# Patient Record
Sex: Female | Born: 1935 | Race: White | Hispanic: No | Smoking: Former smoker
Health system: Southern US, Community
[De-identification: ages and names within clinical notes are randomized; demographics above are authoritative.]

## PROBLEM LIST (undated history)

## (undated) DIAGNOSIS — J40 Bronchitis, not specified as acute or chronic: Secondary | ICD-10-CM

## (undated) DIAGNOSIS — I10 Essential (primary) hypertension: Secondary | ICD-10-CM

## (undated) DIAGNOSIS — E785 Hyperlipidemia, unspecified: Secondary | ICD-10-CM

## (undated) DIAGNOSIS — K759 Inflammatory liver disease, unspecified: Secondary | ICD-10-CM

## (undated) DIAGNOSIS — T7840XA Allergy, unspecified, initial encounter: Secondary | ICD-10-CM

## (undated) DIAGNOSIS — C439 Malignant melanoma of skin, unspecified: Secondary | ICD-10-CM

## (undated) DIAGNOSIS — I639 Cerebral infarction, unspecified: Secondary | ICD-10-CM

## (undated) DIAGNOSIS — S42213A Unspecified displaced fracture of surgical neck of unspecified humerus, initial encounter for closed fracture: Secondary | ICD-10-CM

## (undated) HISTORY — DX: Cerebral infarction, unspecified: I63.9

## (undated) HISTORY — DX: Inflammatory liver disease, unspecified: K75.9

## (undated) HISTORY — DX: Essential (primary) hypertension: I10

## (undated) HISTORY — DX: Allergy, unspecified, initial encounter: T78.40XA

## (undated) HISTORY — PX: OTHER SURGICAL HISTORY: SHX169

## (undated) HISTORY — DX: Hyperlipidemia, unspecified: E78.5

## (undated) HISTORY — PX: TONSILLECTOMY: SUR1361

## (undated) HISTORY — DX: Bronchitis, not specified as acute or chronic: J40

---

## 1998-08-23 ENCOUNTER — Encounter: Admission: RE | Admit: 1998-08-23 | Discharge: 1998-08-23 | Payer: Self-pay | Admitting: Family Medicine

## 2002-02-22 ENCOUNTER — Encounter: Payer: Self-pay | Admitting: Dermatology

## 2002-02-22 ENCOUNTER — Encounter: Admission: RE | Admit: 2002-02-22 | Discharge: 2002-02-22 | Payer: Self-pay | Admitting: Dermatology

## 2002-03-01 ENCOUNTER — Ambulatory Visit (HOSPITAL_BASED_OUTPATIENT_CLINIC_OR_DEPARTMENT_OTHER): Admission: RE | Admit: 2002-03-01 | Discharge: 2002-03-01 | Payer: Self-pay | Admitting: General Surgery

## 2011-01-08 ENCOUNTER — Other Ambulatory Visit: Payer: Self-pay | Admitting: Dermatology

## 2011-01-08 ENCOUNTER — Ambulatory Visit
Admission: RE | Admit: 2011-01-08 | Discharge: 2011-01-08 | Disposition: A | Discharge status: Home / Self Care | Payer: Medicare Other | Source: Ambulatory Visit | Attending: Dermatology | Admitting: Dermatology

## 2011-01-08 DIAGNOSIS — R05 Cough: Secondary | ICD-10-CM

## 2016-05-14 ENCOUNTER — Emergency Department (HOSPITAL_COMMUNITY)
Admission: EM | Admit: 2016-05-14 | Discharge: 2016-05-15 | Disposition: A | Discharge status: Home / Self Care | Payer: Medicare Other | Attending: Emergency Medicine | Admitting: Emergency Medicine

## 2016-05-14 ENCOUNTER — Emergency Department (HOSPITAL_COMMUNITY): Payer: Medicare Other

## 2016-05-14 ENCOUNTER — Encounter (HOSPITAL_COMMUNITY): Payer: Self-pay | Admitting: *Deleted

## 2016-05-14 DIAGNOSIS — Y999 Unspecified external cause status: Secondary | ICD-10-CM | POA: Diagnosis not present

## 2016-05-14 DIAGNOSIS — Z7982 Long term (current) use of aspirin: Secondary | ICD-10-CM | POA: Insufficient documentation

## 2016-05-14 DIAGNOSIS — W010XXA Fall on same level from slipping, tripping and stumbling without subsequent striking against object, initial encounter: Secondary | ICD-10-CM | POA: Diagnosis not present

## 2016-05-14 DIAGNOSIS — Y92000 Kitchen of unspecified non-institutional (private) residence as  the place of occurrence of the external cause: Secondary | ICD-10-CM | POA: Diagnosis not present

## 2016-05-14 DIAGNOSIS — S42292A Other displaced fracture of upper end of left humerus, initial encounter for closed fracture: Secondary | ICD-10-CM

## 2016-05-14 DIAGNOSIS — Y9389 Activity, other specified: Secondary | ICD-10-CM | POA: Diagnosis not present

## 2016-05-14 DIAGNOSIS — S4992XA Unspecified injury of left shoulder and upper arm, initial encounter: Secondary | ICD-10-CM | POA: Diagnosis present

## 2016-05-14 MED ORDER — HYDROCODONE-ACETAMINOPHEN 5-325 MG PO TABS: 1.0000 | ORAL_TABLET | ORAL | 0 refills | Status: DC | PRN

## 2016-05-14 MED ORDER — FENTANYL CITRATE (PF) 100 MCG/2ML IJ SOLN
50.0000 ug | INTRAMUSCULAR | Status: AC | PRN
  Administered 2016-05-14 (×2): 50 ug via INTRAVENOUS
  Filled 2016-05-14 (×2): qty 2

## 2016-05-14 MED ORDER — HYDROCODONE-ACETAMINOPHEN 5-325 MG PO TABS
1.0000 | ORAL_TABLET | Freq: Once | ORAL | Status: AC
  Administered 2016-05-15: 1 via ORAL
  Filled 2016-05-14: qty 1

## 2016-05-14 NOTE — ED Provider Notes (Signed)
[facility] DEPT Provider Note   CSN: [id number] Arrival date & time: 05/14/16  1802  By signing my name below, I, Higinio Plan, attest that this documentation has been prepared under the direction and in the presence of non-physician practitioner, Charlann Lange, PA-C. Electronically Signed: Higinio Plan, Scribe. 05/14/2016. 10:39 PM.  History   Chief Complaint Chief Complaint  Patient presents with  . Fall  . Arm Injury   The history is provided by the patient. No language interpreter was used.   HPI Comments: Melanie Hendricks is a 80 y.o. female who presents to the Emergency Department complaining of left shoulder and arm pain s/p a fall that occurred a few minutes PTA.  Pt reports she was standing in her kitchen when she tripped on something with her right foot causing her to fall onto her left side. She denies hitting her head or loss of consciousness. She notes she was able to walk to the ambulance after the incident; she states she typically does not have balance issues when walking. Pt denies bleeding anywhere, abdominal pain, pain in her hips or BLE, neck pain, chest pain or pain when taking a deep breath. She denies use of blood thinners. Pt states she lives with her son and daughter in law.   History reviewed. No pertinent past medical history.  There are no active problems to display for this patient.  History reviewed. No pertinent surgical history.  OB History    No data available       Home Medications    Prior to Admission medications   Medication Sig Start Date End Date Taking? Authorizing Provider  aspirin EC 325 MG tablet Take 325 mg by mouth daily as needed for moderate pain.   Yes Historical Provider, MD    Family History No family history on file.  Social History Social History  Substance Use Topics  . Smoking status: Never Smoker  . Smokeless tobacco: Never Used  . Alcohol use No     Allergies   Review of patient's allergies indicates no known  allergies.   Review of Systems Review of Systems  Constitutional: Negative for fever.  HENT: Negative for facial swelling.   Respiratory: Negative for shortness of breath.   Cardiovascular: Negative for chest pain.  Gastrointestinal: Negative for abdominal pain.  Genitourinary: Negative for dysuria.  Musculoskeletal: Positive for arthralgias (left arm and shoulder). Negative for neck pain.  Skin: Negative for wound.  Neurological: Negative for dizziness, syncope, weakness and headaches.   Physical Exam Updated Vital Signs BP 161/67   Pulse 91   Temp 97.9 F (36.6 C) (Oral)   Resp 17   Ht 5\' 6"  (1.676 m)   Wt 200 lb (90.7 kg)   SpO2 98%   BMI 32.28 kg/m   Physical Exam  Constitutional: She is oriented to person, place, and time. She appears well-developed and well-nourished.  HENT:  Head: Normocephalic and atraumatic.  Eyes: Conjunctivae are normal. Pupils are equal, round, and reactive to light. Right eye exhibits no discharge. Left eye exhibits no discharge. No scleral icterus.  Neck: Normal range of motion. No JVD present. No tracheal deviation present.  Cardiovascular: Intact distal pulses.   Pulmonary/Chest: Effort normal. No stridor. She exhibits no tenderness.  Abdominal: There is no tenderness (no tenderness of LUQ).  Musculoskeletal:  No midline cervical tenderness, there is no clavicular tenderness or deformity of the [facility] joint  She is tender mostly in the proximal LUE without gross bone deformity  No elbow  tenderness, she has a 5/5 grip strength of the left hand  Neurological: She is alert and oriented to person, place, and time. Coordination normal.  Psychiatric: She has a normal mood and affect. Her behavior is normal. Judgment and thought content normal.  Nursing note and vitals reviewed.  ED Treatments / Results  Labs (all labs ordered are listed, but only abnormal results are displayed) Labs Reviewed - No data to display  EKG  EKG  Interpretation None       Radiology Dg Humerus Left  Result Date: 05/14/2016 CLINICAL DATA:  Fall at home today. Left upper arm pain. Initial encounter. EXAM: LEFT HUMERUS - 2+ VIEW COMPARISON:  None. FINDINGS: A comminuted mildly displaced fracture of the left humeral neck is seen, with extension through the greater tuberosity of the humeral head. IMPRESSION: Mildly displaced comminuted fracture through the left humeral neck and greater tuberosity of the humeral head. Electronically Signed   By: Earle Gell M.D.   On: 05/14/2016 19:48   Procedures Procedures  DIAGNOSTIC STUDIES:  Oxygen Saturation is 98% on RA, normal by my interpretation.    COORDINATION OF CARE:  10:34 PM Discussed treatment plan with pt at bedside and pt agreed to plan.  Medications Ordered in ED Medications  fentaNYL (SUBLIMAZE) injection 50 mcg (50 mcg Intravenous Given 05/14/16 2103)     Initial Impression / Assessment and Plan / ED Course  I have reviewed the triage vital signs and the nursing notes.  Pertinent labs & imaging results that were available during my care of the patient were reviewed by me and considered in my medical decision making (see chart for details).  Clinical Course   1. Left humeral head fracture.  Patient presents with left humeral fracture after mechanical fall. No head injury, other extremity injury, abdominal or chest pain. No coagulopathy. Closed fracture. Immobilizer applied. Will refer to orthopedics.   I personally performed the services described in this documentation, which was scribed in my presence. The recorded information has been reviewed and is accurate.   Final Clinical Impressions(s) / ED Diagnoses   Final diagnoses:  None    New Prescriptions New Prescriptions   No medications on file     Charlann Lange, Hershal Coria 05/14/16 [place], MD 05/15/16 [id number]

## 2016-05-14 NOTE — ED Notes (Signed)
Pt states she got her foot caught on a chair in the kitchen and fell injuring her left arm/shoulder.  Denies feeling faint or dizzy prior to fall.  States she just "tripped and fell in the kitchen"

## 2016-05-14 NOTE — ED Triage Notes (Signed)
Per EMS, pt from home, reports fall, she was in her kitchen, tripped on a chair-had to crawl to the phone to call for help.  She was able to call her son.  Pt is A&Ox 4.  Denies LOC, blood thinners.

## 2016-05-14 NOTE — ED Notes (Signed)
Pt requesting pain medication.  

## 2016-05-15 MED ORDER — ONDANSETRON 4 MG PO TBDP: 4.0000 mg | ORAL_TABLET | Freq: Three times a day (TID) | ORAL | 0 refills | Status: DC | PRN

## 2016-05-15 MED ORDER — ONDANSETRON 4 MG PO TBDP
4.0000 mg | ORAL_TABLET | Freq: Once | ORAL | Status: AC
  Administered 2016-05-15: 4 mg via ORAL
  Filled 2016-05-15: qty 1

## 2016-06-23 ENCOUNTER — Observation Stay (HOSPITAL_COMMUNITY): Payer: Medicare Other

## 2016-06-23 ENCOUNTER — Encounter (HOSPITAL_COMMUNITY): Payer: Self-pay

## 2016-06-23 ENCOUNTER — Inpatient Hospital Stay (HOSPITAL_COMMUNITY)
Admission: EM | Admit: 2016-06-23 | Discharge: 2016-06-26 | DRG: 065 | Disposition: A | Discharge status: Home / Self Care | Payer: Medicare Other | Attending: Family Medicine | Admitting: Family Medicine

## 2016-06-23 ENCOUNTER — Emergency Department (HOSPITAL_COMMUNITY): Payer: Medicare Other

## 2016-06-23 DIAGNOSIS — R29810 Facial weakness: Secondary | ICD-10-CM | POA: Diagnosis present

## 2016-06-23 DIAGNOSIS — R4701 Aphasia: Secondary | ICD-10-CM | POA: Diagnosis present

## 2016-06-23 DIAGNOSIS — R030 Elevated blood-pressure reading, without diagnosis of hypertension: Secondary | ICD-10-CM | POA: Diagnosis not present

## 2016-06-23 DIAGNOSIS — Z6838 Body mass index (BMI) 38.0-38.9, adult: Secondary | ICD-10-CM

## 2016-06-23 DIAGNOSIS — Z85820 Personal history of malignant melanoma of skin: Secondary | ICD-10-CM

## 2016-06-23 DIAGNOSIS — I63519 Cerebral infarction due to unspecified occlusion or stenosis of unspecified middle cerebral artery: Secondary | ICD-10-CM | POA: Diagnosis not present

## 2016-06-23 DIAGNOSIS — Z7982 Long term (current) use of aspirin: Secondary | ICD-10-CM

## 2016-06-23 DIAGNOSIS — R2990 Unspecified symptoms and signs involving the nervous system: Secondary | ICD-10-CM | POA: Diagnosis present

## 2016-06-23 DIAGNOSIS — Z8249 Family history of ischemic heart disease and other diseases of the circulatory system: Secondary | ICD-10-CM

## 2016-06-23 DIAGNOSIS — I6350 Cerebral infarction due to unspecified occlusion or stenosis of unspecified cerebral artery: Secondary | ICD-10-CM | POA: Insufficient documentation

## 2016-06-23 DIAGNOSIS — R000 Tachycardia, unspecified: Secondary | ICD-10-CM | POA: Diagnosis present

## 2016-06-23 DIAGNOSIS — E785 Hyperlipidemia, unspecified: Secondary | ICD-10-CM | POA: Diagnosis present

## 2016-06-23 DIAGNOSIS — I639 Cerebral infarction, unspecified: Principal | ICD-10-CM | POA: Diagnosis present

## 2016-06-23 DIAGNOSIS — K5900 Constipation, unspecified: Secondary | ICD-10-CM | POA: Diagnosis present

## 2016-06-23 DIAGNOSIS — H518 Other specified disorders of binocular movement: Secondary | ICD-10-CM | POA: Diagnosis present

## 2016-06-23 DIAGNOSIS — S42213D Unspecified displaced fracture of surgical neck of unspecified humerus, subsequent encounter for fracture with routine healing: Secondary | ICD-10-CM | POA: Diagnosis present

## 2016-06-23 DIAGNOSIS — H519 Unspecified disorder of binocular movement: Secondary | ICD-10-CM | POA: Diagnosis not present

## 2016-06-23 DIAGNOSIS — Z808 Family history of malignant neoplasm of other organs or systems: Secondary | ICD-10-CM

## 2016-06-23 DIAGNOSIS — G8191 Hemiplegia, unspecified affecting right dominant side: Secondary | ICD-10-CM | POA: Diagnosis present

## 2016-06-23 DIAGNOSIS — E669 Obesity, unspecified: Secondary | ICD-10-CM | POA: Diagnosis present

## 2016-06-23 DIAGNOSIS — Z800 Family history of malignant neoplasm of digestive organs: Secondary | ICD-10-CM

## 2016-06-23 DIAGNOSIS — S42212D Unspecified displaced fracture of surgical neck of left humerus, subsequent encounter for fracture with routine healing: Secondary | ICD-10-CM

## 2016-06-23 DIAGNOSIS — Z9889 Other specified postprocedural states: Secondary | ICD-10-CM

## 2016-06-23 HISTORY — DX: Malignant melanoma of skin, unspecified: C43.9

## 2016-06-23 HISTORY — DX: Unspecified displaced fracture of surgical neck of unspecified humerus, initial encounter for closed fracture: S42.213A

## 2016-06-23 LAB — COMPREHENSIVE METABOLIC PANEL
ALT: 26 U/L (ref 14–54)
AST: 22 U/L (ref 15–41)
Albumin: 4 g/dL (ref 3.5–5.0)
Alkaline Phosphatase: 73 U/L (ref 38–126)
Anion gap: 10 (ref 5–15)
BUN: 13 mg/dL (ref 6–20)
CO2: 21 mmol/L — ABNORMAL LOW (ref 22–32)
Calcium: 9.7 mg/dL (ref 8.9–10.3)
Chloride: 106 mmol/L (ref 101–111)
Creatinine, Ser: 0.78 mg/dL (ref 0.44–1.00)
GFR calc Af Amer: >60 mL/min (ref >60)
GFR calc non Af Amer: >60 mL/min (ref >60)
Glucose, Bld: 127 mg/dL — ABNORMAL HIGH (ref 65–99)
Potassium: 4.1 mmol/L (ref 3.5–5.1)
Sodium: 137 mmol/L (ref 135–145)
Total Bilirubin: 0.4 mg/dL (ref 0.3–1.2)
Total Protein: 7.1 g/dL (ref 6.5–8.1)

## 2016-06-23 LAB — CBC
HCT: 40.9 % (ref 36.0–46.0)
Hemoglobin: 13.7 g/dL (ref 12.0–15.0)
MCH: 30.4 pg (ref 26.0–34.0)
MCHC: 33.5 g/dL (ref 30.0–36.0)
MCV: 90.7 fL (ref 78.0–100.0)
Platelets: 271 10*3/uL (ref 150–400)
RBC: 4.51 MIL/uL (ref 3.87–5.11)
RDW: 12.4 % (ref 11.5–15.5)
WBC: 7.9 10*3/uL (ref 4.0–10.5)

## 2016-06-23 LAB — DIFFERENTIAL
Basophils Absolute: 0 10*3/uL (ref 0.0–0.1)
Basophils Relative: 0 %
Eosinophils Absolute: 0.1 10*3/uL (ref 0.0–0.7)
Eosinophils Relative: 1 %
Lymphocytes Relative: 17 %
Lymphs Abs: 1.4 10*3/uL (ref 0.7–4.0)
Monocytes Absolute: 0.7 10*3/uL (ref 0.1–1.0)
Monocytes Relative: 9 %
Neutro Abs: 5.8 10*3/uL (ref 1.7–7.7)
Neutrophils Relative %: 73 %

## 2016-06-23 LAB — I-STAT CHEM 8, ED
BUN: 16 mg/dL (ref 6–20)
Calcium, Ion: 1.12 mmol/L — ABNORMAL LOW (ref 1.15–1.40)
Chloride: 108 mmol/L (ref 101–111)
Creatinine, Ser: 0.7 mg/dL (ref 0.44–1.00)
Glucose, Bld: 124 mg/dL — ABNORMAL HIGH (ref 65–99)
HCT: 42 % (ref 36.0–46.0)
Hemoglobin: 14.3 g/dL (ref 12.0–15.0)
Potassium: 4.2 mmol/L (ref 3.5–5.1)
Sodium: 139 mmol/L (ref 135–145)
TCO2: 22 mmol/L (ref 0–100)

## 2016-06-23 LAB — APTT: aPTT: 30 seconds (ref 24–36)

## 2016-06-23 LAB — PROTIME-INR
INR: 0.97
Prothrombin Time: 12.9 seconds (ref 11.4–15.2)

## 2016-06-23 LAB — ETHANOL: Alcohol, Ethyl (B): <5 mg/dL (ref <5)

## 2016-06-23 LAB — I-STAT TROPONIN, ED: Troponin i, poc: 0.01 ng/mL (ref 0.00–0.08)

## 2016-06-23 IMAGING — CT CT HEAD CODE STROKE
3 of 4 series · 17 of 47 positions shown, 20 images · non-contrast
Comparison: None.

CLINICAL DATA: Code stroke. Left-sided weakness, slurred speech and
facial droop

EXAM:
CT HEAD WITHOUT CONTRAST
TECHNIQUE: Contiguous axial images were obtained from the base of the skull
through the vertex without intravenous contrast.

[Series 201: head w/o, idose (1) · axial · non-contrast · 0.39mm/px · z∈[+904,+1034]mm · 11 of 32 slices shown, 14 images]
[im 3/32  brain]
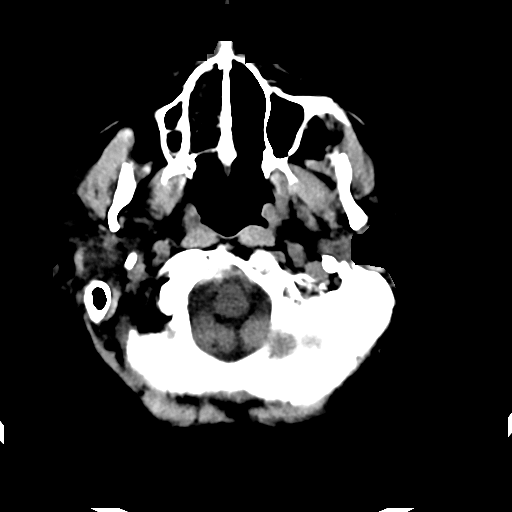
[im 3/32  bone]
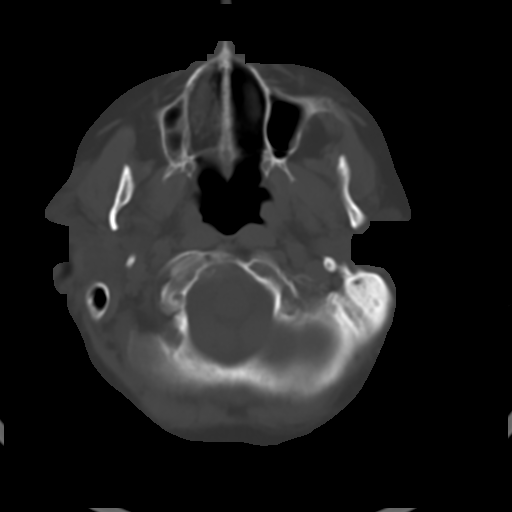
[im 5/32  brain]
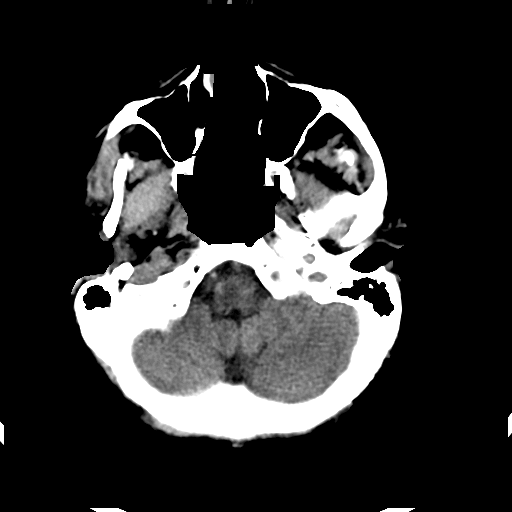
[im 7/32  brain]
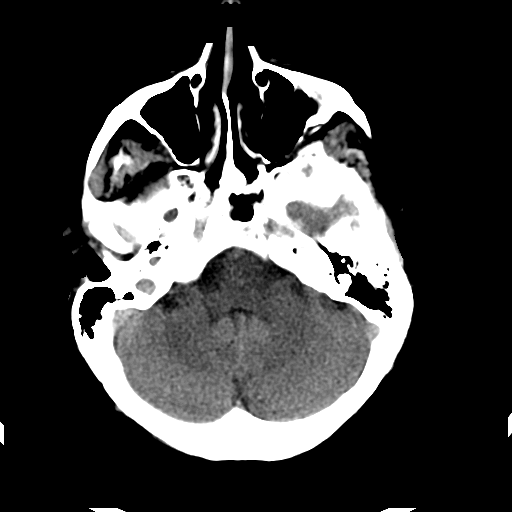
[im 12/32  brain]
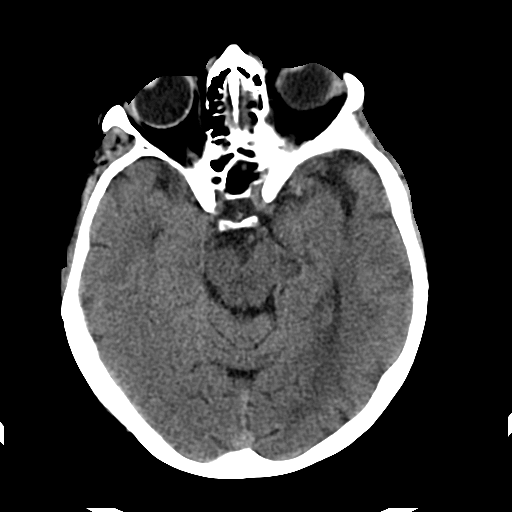
[im 14/32  brain]
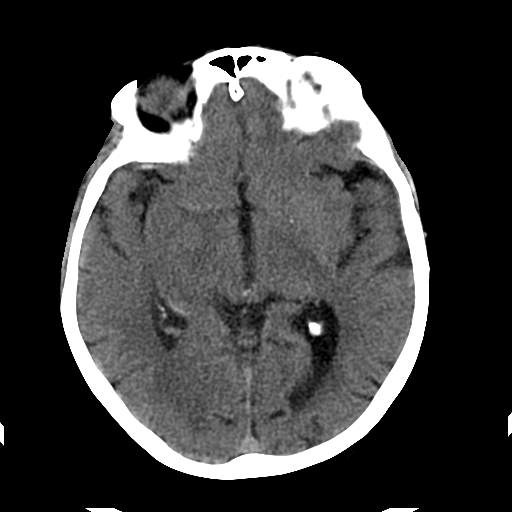
[im 14/32  bone]
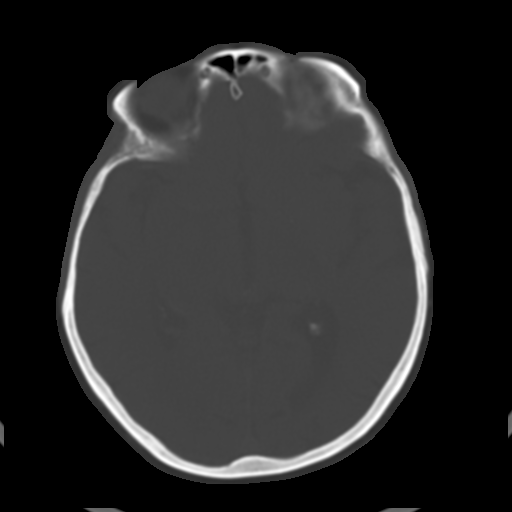
[im 16/32  brain]
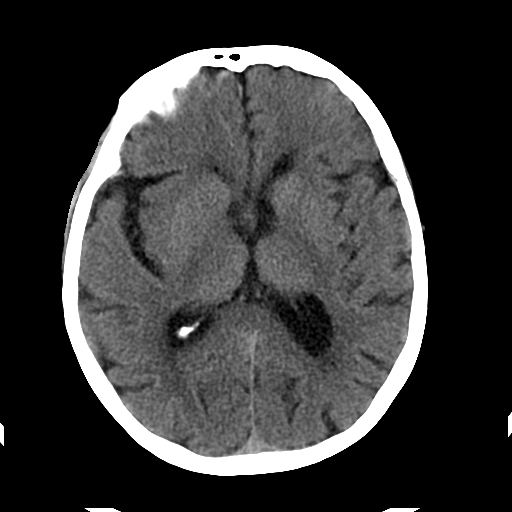
[im 18/32  brain]
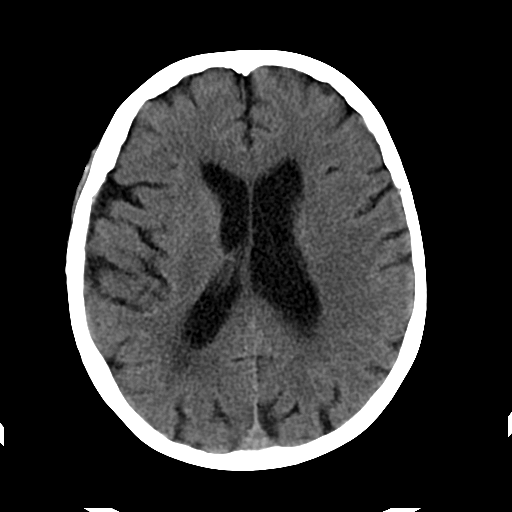
[im 20/32  brain]
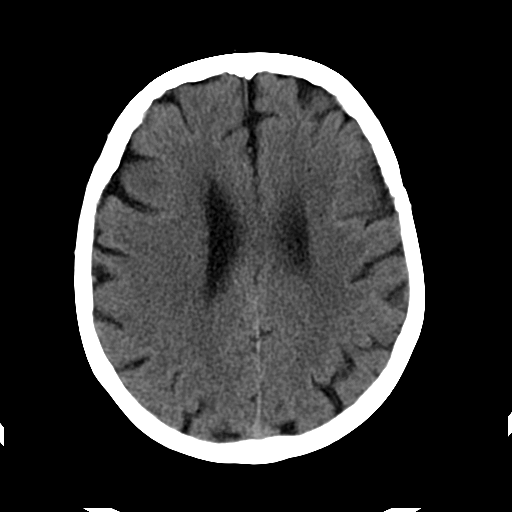
[im 25/32  brain]
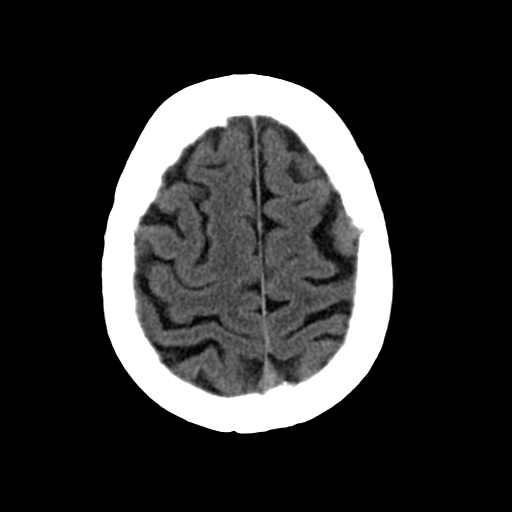
[im 25/32  bone]
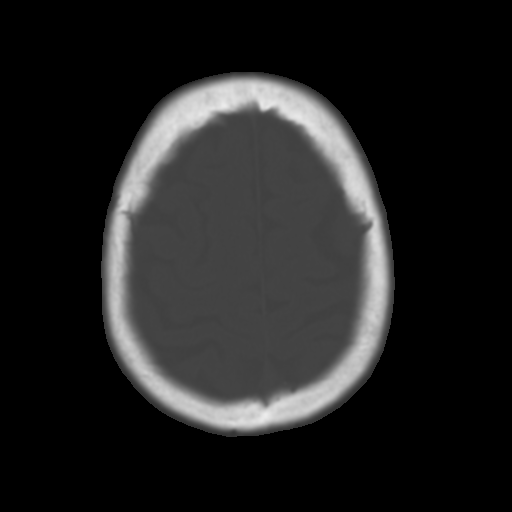
[im 27/32  brain]
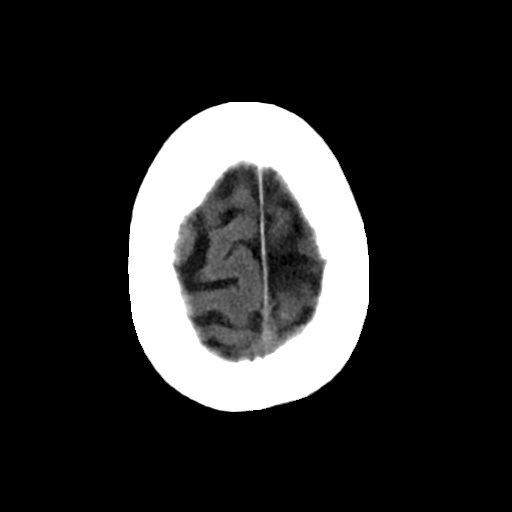
[im 29/32  brain]
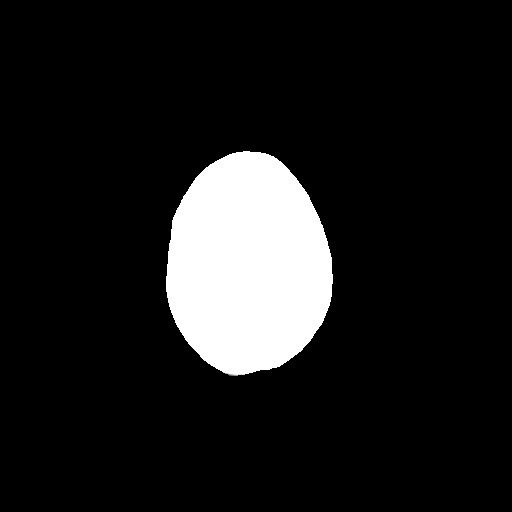

[Series 203: coronal st, idose (1) · coronal · 0.39mm/px · 3 of 66 slices shown]
[im 22/66  brain]
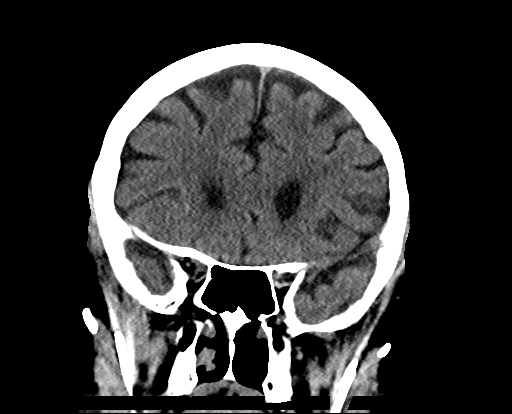
[im 29/66  brain]
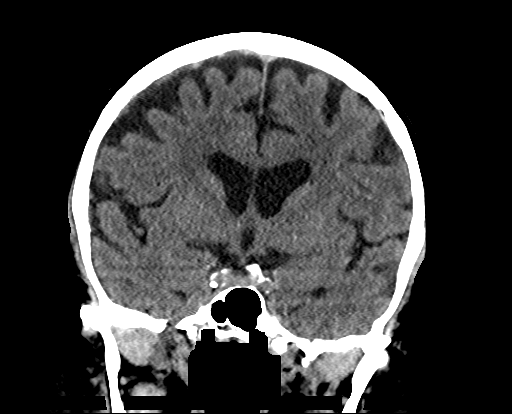
[im 37/66  brain]
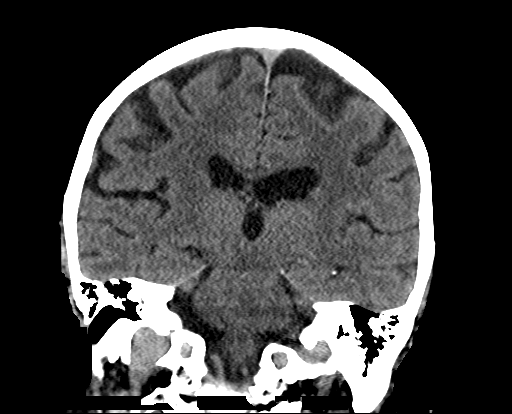

[Series 204: sagittal st, idose (1) · sagittal · 0.39mm/px · 3 of 66 slices shown]
[im 22/66  brain]
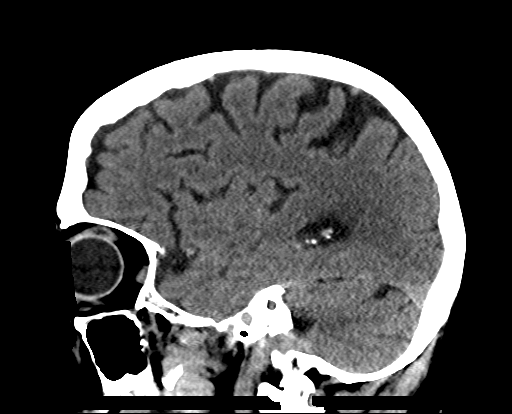
[im 33/66  brain]
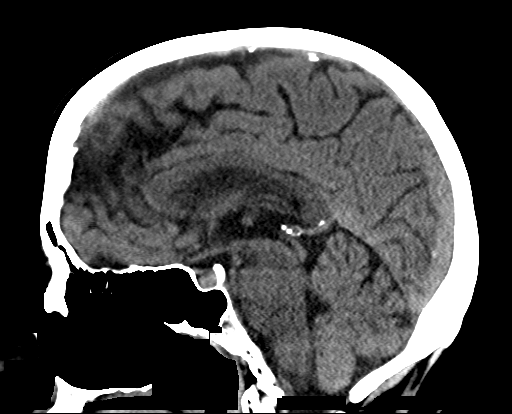
[im 44/66  brain]
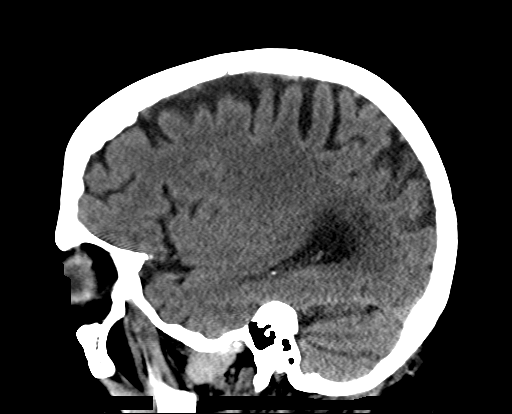

[17 of 47 positions shown; findings below may reference images not displayed]

FINDINGS: Brain: There is no acute hemorrhage. No evidence of acute cortical
infarct. No midline shift or mass effect. There is periventricular
hypoattenuation compatible with chronic microvascular disease. No
hydrocephalus.

Vascular: No hyperdense vessel sign.

Skull: Normal. Negative for fracture or focal lesion.

Sinuses/Orbits: No fluid levels or advanced mucosal thickening.
Normal orbits.

Other: Visualized extracranial soft tissues are normal.

ASPECTS ([place] Stroke Program Early CT Score)

- Ganglionic level infarction (caudate, lentiform nuclei, internal
capsule, insula, M1-M3 cortex): 7

- Supraganglionic infarction (M4-M6 cortex): 3

Total score (0-10 with 10 being normal): 10
IMPRESSION: 1. No acute intracranial abnormality.
2. ASPECTS is 10.

These results were called by telephone at the time of interpretation
on [DATE] at [DATE] to BANDA, PA, who verbally
acknowledged these results.

## 2016-06-23 MED ORDER — ASPIRIN 325 MG PO TABS: 325.0000 mg | ORAL_TABLET | Freq: Every day | ORAL | Status: DC

## 2016-06-23 MED ORDER — ENOXAPARIN SODIUM 40 MG/0.4ML ~~LOC~~ SOLN
40.0000 mg | SUBCUTANEOUS | Status: DC
  Administered 2016-06-24 – 2016-06-26 (×3): 40 mg via SUBCUTANEOUS
  Filled 2016-06-23 (×3): qty 0.4

## 2016-06-23 MED ORDER — SODIUM CHLORIDE 0.9 % IV SOLN
INTRAVENOUS | Status: DC
  Administered 2016-06-24: 75 mL via INTRAVENOUS
  Administered 2016-06-24: 04:00:00 via INTRAVENOUS

## 2016-06-23 MED ORDER — ASPIRIN 300 MG RE SUPP: 300.0000 mg | Freq: Every day | RECTAL | Status: DC

## 2016-06-23 MED ORDER — HYDROCODONE-ACETAMINOPHEN 5-325 MG PO TABS: 1.0000 | ORAL_TABLET | ORAL | Status: DC | PRN

## 2016-06-23 MED ORDER — STROKE: EARLY STAGES OF RECOVERY BOOK
Freq: Once | Status: DC
  Filled 2016-06-23 (×2): qty 1

## 2016-06-23 MED ORDER — ONDANSETRON HCL 4 MG/2ML IJ SOLN: 4.0000 mg | Freq: Three times a day (TID) | INTRAMUSCULAR | Status: DC | PRN

## 2016-06-23 MED ORDER — ACETAMINOPHEN 325 MG PO TABS
650.0000 mg | ORAL_TABLET | Freq: Four times a day (QID) | ORAL | Status: DC | PRN
  Administered 2016-06-26: 650 mg via ORAL
  Filled 2016-06-23: qty 2

## 2016-06-23 MED ORDER — HYDRALAZINE HCL 20 MG/ML IJ SOLN: 5.0000 mg | INTRAMUSCULAR | Status: DC | PRN

## 2016-06-23 MED ORDER — ZOLPIDEM TARTRATE 5 MG PO TABS: 5.0000 mg | ORAL_TABLET | Freq: Every evening | ORAL | Status: DC | PRN

## 2016-06-23 MED ORDER — SENNOSIDES-DOCUSATE SODIUM 8.6-50 MG PO TABS: 1.0000 | ORAL_TABLET | Freq: Every evening | ORAL | Status: DC | PRN

## 2016-06-23 NOTE — ED Notes (Signed)
Patient transported to MRI at this time via ED stretcher. Pt in no apparent distress at this time.

## 2016-06-23 NOTE — ED Notes (Signed)
Pt taken to CT by this RN.  ?

## 2016-06-23 NOTE — ED Notes (Signed)
Pt back in room from CT 

## 2016-06-23 NOTE — ED Notes (Signed)
Pt transported to MRI 

## 2016-06-23 NOTE — ED Triage Notes (Signed)
Per EMS, pt laid down for a nap around 1500 and woke up around 1600 with slurred speech, right side gaze, right side weakness. Hypertensive with EMS 201/93, Sinus tach, 121, RR 20, CBG 128. Pt was recently placed on vicodin for left arm fracture. Denies drug or alcohol use. Pt has left sided facial droop here and states "i feel weak on my right side" and patient has right side gaze.

## 2016-06-23 NOTE — ED Provider Notes (Signed)
[facility] DEPT Provider Note   CSN: [id number] Arrival date & time: 06/23/16  2035     History   Chief Complaint Chief Complaint  Patient presents with  . Aphasia  . Facial Droop    HPI Melanie Hendricks is a 80 y.o. female.  HPI   80 year old female presents today with strokelike symptoms. Son notes that coming home from work today at 5 units that his mother was having difficulty with speaking, slurred speech, with numbness and tingling. He denies any history of the same, reports that he last saw her normal this morning. Patient reports that she went to bed to take a nap around 3:30, and woke up around 4:30 noticing that she "felt fatigued". She reports she is having difficulty speaking, reports right sided weakness. She denies any specific headache complaints, reports numbness to the right upper and lower extremity.  Patient denies any significant past medical history other than allergic rhinitis. She reports that her blood pressure was elevated several months ago at the last orthopedic visit, not taking any medications. Patient denies any blood thinners. Denies any alcohol use.   Past Medical History:  Diagnosis Date  . Fx humeral neck   . Skin melanoma [facility])     Patient Active Problem List   Diagnosis Date Noted  . 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 Blood pressure elevated without history of HTN 06/23/2016  . Stroke ([place]) 06/23/2016  . Stroke-like symptoms   . Eye movement disorder   . Fx humeral neck     Past Surgical History:  Procedure Laterality Date  . TONSILLECTOMY      OB History    No data available       Home Medications    Prior to Admission medications   Medication Sig Start Date End Date Taking? Authorizing Provider  acetaminophen (TYLENOL) 325 MG tablet Take 325 mg by mouth every 6 (six) hours as needed for mild pain or headache.   Yes Historical Provider, MD  aspirin EC 325 MG tablet Take 325 mg by mouth daily as needed for moderate  pain.   Yes Historical Provider, MD  HYDROcodone-acetaminophen (NORCO/VICODIN) 5-325 MG tablet Take 1 tablet by mouth every 4 (four) hours as needed. 05/14/16  Yes Shari Upstill, PA-C  ondansetron (ZOFRAN ODT) 4 MG disintegrating tablet Take 1 tablet (4 mg total) by mouth every 8 (eight) hours as needed for nausea or vomiting. 05/15/16  Yes Charlann Lange, PA-C    Family History Family History  Problem Relation Age of Onset  . Stomach cancer Mother   . Thyroid cancer Father   . Heart attack Father   . Seizures Brother   . Heart failure Brother     Social History Social History  Substance Use Topics  . Smoking status: Never Smoker  . Smokeless tobacco: Never Used  . Alcohol use No     Allergies   Review of patient's allergies indicates no known allergies.   Review of Systems Review of Systems  All other systems reviewed and are negative.   Physical Exam Updated Vital Signs BP 174/72   Pulse 110   Temp 98 F (36.7 C) (Oral)   Resp (!) 29   Ht 5\' 6"  (1.676 m)   Wt 90.7 kg   SpO2 93%   BMI 32.28 kg/m   Physical Exam  Constitutional: She is oriented to person, place, and time. She appears well-developed and well-nourished.  HENT:  Head: Normocephalic and atraumatic.  Eyes: Conjunctivae are normal. Pupils  are equal, round, and reactive to light. Right eye exhibits no discharge. Left eye exhibits no discharge. No scleral icterus.  Neck: Normal range of motion. No JVD present. No tracheal deviation present.  Pulmonary/Chest: Effort normal. No stridor.  Neurological: She is alert and oriented to person, place, and time. Coordination normal.  Alert and oriented, follows commands, extraocular movements are intact with right-sided gaze overcome by patient, visual fields intact, minor facial palsy left-sided, no pronator drift, left leg motor intact with no drift, no ataxia, sensation intact throughout, mild aphasia, mild dysarthria  Psychiatric: She has a normal mood and affect.  Her behavior is normal. Judgment and thought content normal.  Nursing note and vitals reviewed. Alert and oriented, follows commands, extraocular movements are intact with right-sided gaze overcome by patient, visual fields intact, minor facial palsy left-sided, no pronator drift, left leg motor intact with no drift, no ataxia, sensation intact throughout, mild aphasia, mild dysarthria  NIH 4  ED Treatments / Results  Labs (all labs ordered are listed, but only abnormal results are displayed) Labs Reviewed  COMPREHENSIVE METABOLIC PANEL - Abnormal; Notable for the following:       Result Value   CO2 21 (*)    Glucose, Bld 127 (*)    All other components within normal limits  I-STAT CHEM 8, ED - Abnormal; Notable for the following:    Glucose, Bld 124 (*)    Calcium, Ion 1.12 (*)    All other components within normal limits  ETHANOL  PROTIME-INR  APTT  CBC  DIFFERENTIAL  RAPID URINE DRUG SCREEN, HOSP PERFORMED  URINALYSIS, ROUTINE W REFLEX MICROSCOPIC (NOT AT [facility])  HEMOGLOBIN A1C  LIPID PANEL  I-STAT TROPOININ, ED    EKG  EKG Interpretation None       Radiology Ct Head Code Stroke W/o Cm  Result Date: 06/23/2016 CLINICAL DATA:  Code stroke. Left-sided weakness, slurred speech and facial droop EXAM: CT HEAD WITHOUT CONTRAST TECHNIQUE: Contiguous axial images were obtained from the base of the skull through the vertex without intravenous contrast. COMPARISON:  None. FINDINGS: Brain: There is no acute hemorrhage. No evidence of acute cortical infarct. No midline shift or mass effect. There is periventricular hypoattenuation compatible with chronic microvascular disease. No hydrocephalus. Vascular: No hyperdense vessel sign. Skull: Normal. Negative for fracture or focal lesion. Sinuses/Orbits: No fluid levels or advanced mucosal thickening. Normal orbits. Other: Visualized extracranial soft tissues are normal. ASPECTS ([place] Stroke Program Early CT Score) - Ganglionic level  infarction (caudate, lentiform nuclei, internal capsule, insula, M1-M3 cortex): 7 - Supraganglionic infarction (M4-M6 cortex): 3 Total score (0-10 with 10 being normal): 10 IMPRESSION: 1. No acute intracranial abnormality. 2. ASPECTS is 10. These results were called by telephone at the time of interpretation on 06/23/2016 at 9:12 pm to [place], PA, who verbally acknowledged these results. Electronically Signed   By: Ulyses Jarred M.D.   On: 06/23/2016 21:12    Procedures Procedures (including critical care time)  Medications Ordered in ED Medications  acetaminophen (TYLENOL) tablet 650 mg (not administered)  HYDROcodone-acetaminophen (NORCO/VICODIN) 5-325 MG per tablet 1 tablet (not administered)  hydrALAZINE (APRESOLINE) injection 5 mg (not administered)  ondansetron (ZOFRAN) injection 4 mg (not administered)  0.9 %  sodium chloride infusion (not administered)   stroke: mapping our early stages of recovery book (not administered)  senna-docusate (Senokot-S) tablet 1 tablet (not administered)  enoxaparin (LOVENOX) injection 40 mg (not administered)  aspirin suppository 300 mg (not administered)    Or  aspirin  tablet 325 mg (not administered)  zolpidem (AMBIEN) tablet 5 mg (not administered)     Initial Impression / Assessment and Plan / ED Course  I have reviewed the triage vital signs and the nursing notes.  Pertinent labs & imaging results that were available during my care of the patient were reviewed by me and considered in my medical decision making (see chart for details).  Clinical Course     Final Clinical Impressions(s) / ED Diagnoses   Final diagnoses:  Stroke-like symptoms  Stroke (cerebrum) ([place])  Closed fracture of neck of left humerus with routine healing, subsequent encounter    Labs: Stroke order set  Imaging: CT code stroke  Consults:  Therapeutics:  Discharge Meds:   Assessment/Plan:  80 year old female presents today with strokelike  symptoms. Code stroke called, CT shows no acute findings, patient continues to have symptoms, no worsening since waking. Patient was evaluated by Dr. Nicole Kindred. The ED who recommended hospital admission and stroke workup. Hospitalist consult at and agreed to admission.       New Prescriptions New Prescriptions   No medications on file     Okey Regal, PA-C 06/23/16 2224    Duffy Bruce, MD 06/25/16 1459

## 2016-06-23 NOTE — Consult Note (Signed)
Admission H&P    Chief Complaint: Slurred speech, numbness and tingling and deviation.  HPI: Melanie Hendricks is an 80 y.o. female with no documented systemic medical disorder presenting with new onset slurred speech, tingling and numbness involving left arm and hand, and deviation of eyes to the right side. Patient has no previous history of stroke nor TIA. She has not been taking antiplatelet medication on a routine basis. CT scan of her head showed no acute intracranial abnormality. No clear facial droop was noted by family. Speech apparently has improved but still not quite back to baseline. Patient fractured her left humerus 5 weeks ago and has kept her left upper extremity in a sling. NIH stroke score was 2.  LSN: 3:00 PM on 06/23/2016 tPA Given: No: Beyond time under for treatment consideration mRankin:  History reviewed. No pertinent past medical history.  History reviewed. No pertinent surgical history.  History reviewed. No pertinent family history. Social History:  reports that she has never smoked. She has never used smokeless tobacco. She reports that she does not drink alcohol or use drugs.  Allergies: No Known Allergies  Medications: Preadmission medications were reviewed by me.  ROS: History obtained from the patient  General ROS: negative for - chills, fatigue, fever, night sweats, weight gain or weight loss Psychological ROS: negative for - behavioral disorder, hallucinations, memory difficulties, mood swings or suicidal ideation Ophthalmic ROS: negative for - blurry vision, double vision, eye pain or loss of vision ENT ROS: negative for - epistaxis, nasal discharge, oral lesions, sore throat, tinnitus or vertigo Allergy and Immunology ROS: negative for - hives or itchy/watery eyes Hematological and Lymphatic ROS: negative for - bleeding problems, bruising or swollen lymph nodes Endocrine ROS: negative for - galactorrhea, hair pattern changes, polydipsia/polyuria or  temperature intolerance Respiratory ROS: negative for - cough, hemoptysis, shortness of breath or wheezing Cardiovascular ROS: negative for - chest pain, dyspnea on exertion, edema or irregular heartbeat Gastrointestinal ROS: negative for - abdominal pain, diarrhea, hematemesis, nausea/vomiting or stool incontinence Genito-Urinary ROS: negative for - dysuria, hematuria, incontinence or urinary frequency/urgency Musculoskeletal ROS: negative for - joint swelling or muscular weakness Neurological ROS: as noted in HPI Dermatological ROS: negative for rash and skin lesion changes  Physical Examination: Blood pressure 174/74, pulse 111, temperature 98 F (36.7 C), temperature source Oral, resp. rate 25, height 5\' 6"  (1.676 m), weight 90.7 kg (200 lb), SpO2 97 %.  HEENT-  Normocephalic, no lesions, without obvious abnormality.  Normal external eye and conjunctiva.  Normal TM's bilaterally.  Normal auditory canals and external ears. Normal external nose, mucus membranes and septum.  Normal pharynx. Neck supple with no masses, nodes, nodules or enlargement. Cardiovascular - tachycardia, regular rhythm, normal S1 and S2, no murmur or gallop Lungs - chest clear, no wheezing, rales, normal symmetric air entry Abdomen - soft, non-tender; bowel sounds normal; no masses,  no organomegaly Extremities - no joint deformities, effusion, or inflammation  Neurologic Examination: Mental Status: Alert, oriented, thought content appropriate.  Speech fluent without evidence of aphasia. Able to follow commands without difficulty. Cranial Nerves: II-Visual fields were normal. III/IV/VI-Pupils were equal and reacted normally to light. Eyes were conjugately deviated to the right side and did not move leftward beyond the midline.    V/VII-no facial numbness and no facial weakness. VIII-normal. X-normal speech and symmetrical palatal movement. XI: trapezius strength/neck flexion strength normal  bilaterally XII-midline tongue extension with normal strength. Motor: 5/5 strength of right upper extremity and lower extremities. Strength of  left upper extremity was difficult to assess because of pain with movement. Sensory: Normal throughout. Deep Tendon Reflexes: 1+ and symmetric. Plantars: Flexor bilaterally Cerebellar: Normal finger-to-nose testing. Carotid auscultation: Normal  Results for orders placed or performed during the hospital encounter of 06/23/16 (from the past 48 hour(s))  Protime-INR     Status: None   Collection Time: 06/23/16  9:21 PM  Result Value Ref Range   Prothrombin Time 12.9 11.4 - 15.2 seconds   INR 0.97   APTT     Status: None   Collection Time: 06/23/16  9:21 PM  Result Value Ref Range   aPTT 30 24 - 36 seconds  CBC     Status: None   Collection Time: 06/23/16  9:21 PM  Result Value Ref Range   WBC 7.9 4.0 - 10.5 K/uL   RBC 4.51 3.87 - 5.11 MIL/uL   Hemoglobin 13.7 12.0 - 15.0 g/dL   HCT 40.9 36.0 - 46.0 %   MCV 90.7 78.0 - 100.0 fL   MCH 30.4 26.0 - 34.0 pg   MCHC 33.5 30.0 - 36.0 g/dL   RDW 12.4 11.5 - 15.5 %   Platelets 271 150 - 400 K/uL  Differential     Status: None   Collection Time: 06/23/16  9:21 PM  Result Value Ref Range   Neutrophils Relative % 73 %   Neutro Abs 5.8 1.7 - 7.7 K/uL   Lymphocytes Relative 17 %   Lymphs Abs 1.4 0.7 - 4.0 K/uL   Monocytes Relative 9 %   Monocytes Absolute 0.7 0.1 - 1.0 K/uL   Eosinophils Relative 1 %   Eosinophils Absolute 0.1 0.0 - 0.7 K/uL   Basophils Relative 0 %   Basophils Absolute 0.0 0.0 - 0.1 K/uL  I-stat troponin, ED (not at [facility], [facility])     Status: None   Collection Time: 06/23/16  9:32 PM  Result Value Ref Range   Troponin i, poc 0.01 0.00 - 0.08 ng/mL   Comment 3            Comment: Due to the release kinetics of cTnI, a negative result within the first hours of the onset of symptoms does not rule out myocardial infarction with certainty. If myocardial infarction is still  suspected, repeat the test at appropriate intervals.   I-Stat Chem 8, ED  (not at [facility], [facility])     Status: Abnormal   Collection Time: 06/23/16  9:34 PM  Result Value Ref Range   Sodium 139 135 - 145 mmol/L   Potassium 4.2 3.5 - 5.1 mmol/L   Chloride 108 101 - 111 mmol/L   BUN 16 6 - 20 mg/dL   Creatinine, Ser 0.70 0.44 - 1.00 mg/dL   Glucose, Bld 124 (H) 65 - 99 mg/dL   Calcium, Ion 1.12 (L) 1.15 - 1.40 mmol/L   TCO2 22 0 - 100 mmol/L   Hemoglobin 14.3 12.0 - 15.0 g/dL   HCT 42.0 36.0 - 46.0 %   Ct Head Code Stroke W/o Cm  Result Date: 06/23/2016 CLINICAL DATA:  Code stroke. Left-sided weakness, slurred speech and facial droop EXAM: CT HEAD WITHOUT CONTRAST TECHNIQUE: Contiguous axial images were obtained from the base of the skull through the vertex without intravenous contrast. COMPARISON:  None. FINDINGS: Brain: There is no acute hemorrhage. No evidence of acute cortical infarct. No midline shift or mass effect. There is periventricular hypoattenuation compatible with chronic microvascular disease. No hydrocephalus. Vascular: No hyperdense vessel sign. Skull: Normal. Negative for  fracture or focal lesion. Sinuses/Orbits: No fluid levels or advanced mucosal thickening. Normal orbits. Other: Visualized extracranial soft tissues are normal. ASPECTS ([place] Stroke Program Early CT Score) - Ganglionic level infarction (caudate, lentiform nuclei, internal capsule, insula, M1-M3 cortex): 7 - Supraganglionic infarction (M4-M6 cortex): 3 Total score (0-10 with 10 being normal): 10 IMPRESSION: 1. No acute intracranial abnormality. 2. ASPECTS is 10. These results were called by telephone at the time of interpretation on 06/23/2016 at 9:12 pm to Nellie, PA, who verbally acknowledged these results. Electronically Signed   By: Ulyses Jarred M.D.   On: 06/23/2016 21:12    Assessment: 80 y.o. female presenting with possible acute right cerebral infarction. Focal seizure activity is unlikely, but  cannot be completely ruled out.  Stroke Risk Factors - none  Plan: 1. HgbA1c, fasting lipid panel 2. MRI, MRA  of the brain without contrast 3. PT consult, OT consult 4. Echocardiogram 5. Carotid dopplers 6. Prophylactic therapy-Antiplatelet med: Aspirin  7. Risk factor modification 8. Telemetry monitoring 9. EEG, routine adult study  C.R. Nicole Kindred, MD [facility] [phone]  06/23/2016, 9:50 PM

## 2016-06-23 NOTE — H&P (Signed)
History and Physical    Melanie Hendricks [id number] DOB: 1936/02/26 DOA: 06/23/2016  Referring MD/NP/PA:   PCP: Pcp Not In System   Patient coming from:  The patient is coming from home.  At baseline, pt is independent for most of ADL.  Chief Complaint: Slurred speech, deviation of eyes to the right side, right facial droop, right-sided numbness  HPI: Melanie Hendricks is a 80 y.o. female with medical history significant of skin melanoma, who presents with slurred speech, deviation of eyes to the right side, right facial droop, right-sided numbness.  Per her son, pt laid down for a nap around 1500 and woke up around 1600 with slurred speech, right side gaze, right side numbness. She is also mildly disoriented. Patient states that she also has right foot heaviness. No hearing loss. She has nausea, and vomited once. No abdominal pain or diarrhea. Patient denies chest pain, shortness breath, fever, chills, symptoms of UTI. Per EMS, pt was hypertensive with Bp 201/93, sinus tach, 121, RR 20, CBG 128. Pt had left humeral neck fracture which did not require surgery. She still has mild pain and is currently taking Vicodin and Tylenol for pain.  ED Course: pt was found to have WBC 7.9, INR 0.7, negative troponin, pending a urinalysis, electrolytes and renal function okay, temperature normal, tachycardia with heart rate ~110, negative CT head for acute intracranial abnormalities. Patient is placed on telemetry bed for observation. Neurology, Dr. Nicole Kindred was consulted.  Review of Systems:   General: no fevers, chills, no changes in body weight, has fatigue HEENT: no blurry vision, hearing changes or sore throat Respiratory: no dyspnea, coughing, wheezing CV: no chest pain, no palpitations GI: no nausea, vomiting, abdominal pain, diarrhea, constipation GU: no dysuria, burning on urination, increased urinary frequency, hematuria  Ext: no leg edema Neuro: has right sided numbness and tingling,  right facial droop and slurred speech. Has eye deviation to the right side. No hearing loss. Skin: no rash, no skin tear. MSK: No muscle spasm, no deformity, no limitation of range of movement in spin Heme: No easy bruising.  Travel history: No recent long distant travel.  Allergy: No Known Allergies  Past Medical History:  Diagnosis Date  . Fx humeral neck   . Skin melanoma ([place])     Past Surgical History:  Procedure Laterality Date  . TONSILLECTOMY      Social History:  reports that she has never smoked. She has never used smokeless tobacco. She reports that she does not drink alcohol or use drugs.  Family History:  Family History  Problem Relation Age of Onset  . Stomach cancer Mother   . Thyroid cancer Father   . Heart attack Father   . Seizures Brother   . Heart failure Brother      Prior to Admission medications   Medication Sig Start Date End Date Taking? Authorizing Provider  acetaminophen (TYLENOL) 325 MG tablet Take 325 mg by mouth every 6 (six) hours as needed for mild pain or headache.   Yes Historical Provider, MD  aspirin EC 325 MG tablet Take 325 mg by mouth daily as needed for moderate pain.   Yes Historical Provider, MD  HYDROcodone-acetaminophen (NORCO/VICODIN) 5-325 MG tablet Take 1 tablet by mouth every 4 (four) hours as needed. 05/14/16  Yes Shari Upstill, PA-C  ondansetron (ZOFRAN ODT) 4 MG disintegrating tablet Take 1 tablet (4 mg total) by mouth every 8 (eight) hours as needed for nausea or vomiting. 05/15/16  Yes Charlann Lange,  PA-C    Physical Exam: Vitals:   06/23/16 2115 06/23/16 2130 06/23/16 2145 06/23/16 2200  BP: 182/69 186/70 181/66 174/72  Pulse: 117 110 110 110  Resp: 23 26 26  (!) 29  Temp:      TempSrc:      SpO2: 96% 96% 96% 93%  Weight:      Height:       General: Not in acute distress HEENT:       Eyes: PERRL, EOMI, no scleral icterus.       ENT: No discharge from the ears and nose, no pharynx injection, no tonsillar  enlargement.        Neck: No JVD, no bruit, no mass felt. Heme: No neck lymph node enlargement. Cardiac: S1/S2, RRR, No murmurs, No gallops or rubs. Respiratory: No rales, wheezing, rhonchi or rubs. GI: Soft, nondistended, nontender, no rebound pain, no organomegaly, BS present. GU: No hematuria Ext: No pitting leg edema bilaterally. 2+DP/PT pulse bilaterally. Musculoskeletal: No joint deformities, No joint redness or warmth, no limitation of ROM in spin. Skin: No rashes.  Neuro: Alert, mildly confused, but oriented X3, has eye deviation to the right side and the right facial droop. Muscle strength 5/5 in all extremities, sensation to light touch intact. Knee reflex 1+ bilaterally. Negative Babinski's sign.  Psych: Patient is not psychotic, no suicidal or hemocidal ideation.  Labs on Admission: I have personally reviewed following labs and imaging studies  CBC:  Recent Labs Lab 06/23/16 2121 06/23/16 2134  WBC 7.9  --   NEUTROABS 5.8  --   HGB 13.7 14.3  HCT 40.9 42.0  MCV 90.7  --   PLT 271  --    Basic Metabolic Panel:  Recent Labs Lab 06/23/16 2121 06/23/16 2134  NA 137 139  K 4.1 4.2  CL 106 108  CO2 21*  --   GLUCOSE 127* 124*  BUN 13 16  CREATININE 0.78 0.70  CALCIUM 9.7  --    GFR: Estimated Creatinine Clearance: 66.8 mL/min (by C-G formula based on SCr of 0.7 mg/dL). Liver Function Tests:  Recent Labs Lab 06/23/16 2121  AST 22  ALT 26  ALKPHOS 73  BILITOT 0.4  PROT 7.1  ALBUMIN 4.0   No results for input(s): LIPASE, AMYLASE in the last 168 hours. No results for input(s): AMMONIA in the last 168 hours. Coagulation Profile:  Recent Labs Lab 06/23/16 2121  INR 0.97   Cardiac Enzymes: No results for input(s): CKTOTAL, CKMB, CKMBINDEX, TROPONINI in the last 168 hours. BNP (last 3 results) No results for input(s): PROBNP in the last 8760 hours. HbA1C: No results for input(s): HGBA1C in the last 72 hours. CBG: No results for input(s): GLUCAP  in the last 168 hours. Lipid Profile: No results for input(s): CHOL, HDL, LDLCALC, TRIG, CHOLHDL, LDLDIRECT in the last 72 hours. Thyroid Function Tests: No results for input(s): TSH, T4TOTAL, FREET4, T3FREE, THYROIDAB in the last 72 hours. Anemia Panel: No results for input(s): VITAMINB12, FOLATE, FERRITIN, TIBC, IRON, RETICCTPCT in the last 72 hours. Urine analysis: No results found for: COLORURINE, APPEARANCEUR, LABSPEC, PHURINE, GLUCOSEU, HGBUR, BILIRUBINUR, KETONESUR, PROTEINUR, UROBILINOGEN, NITRITE, LEUKOCYTESUR Sepsis Labs: @LABRCNTIP (procalcitonin:4,lacticidven:4) )No results found for this or any previous visit (from the past 240 hour(s)).   Radiological Exams on Admission: Ct Head Code Stroke W/o Cm  Result Date: 06/23/2016 CLINICAL DATA:  Code stroke. Left-sided weakness, slurred speech and facial droop EXAM: CT HEAD WITHOUT CONTRAST TECHNIQUE: Contiguous axial images were obtained from the base of the  skull through the vertex without intravenous contrast. COMPARISON:  None. FINDINGS: Brain: There is no acute hemorrhage. No evidence of acute cortical infarct. No midline shift or mass effect. There is periventricular hypoattenuation compatible with chronic microvascular disease. No hydrocephalus. Vascular: No hyperdense vessel sign. Skull: Normal. Negative for fracture or focal lesion. Sinuses/Orbits: No fluid levels or advanced mucosal thickening. Normal orbits. Other: Visualized extracranial soft tissues are normal. ASPECTS ([place] Stroke Program Early CT Score) - Ganglionic level infarction (caudate, lentiform nuclei, internal capsule, insula, M1-M3 cortex): 7 - Supraganglionic infarction (M4-M6 cortex): 3 Total score (0-10 with 10 being normal): 10 IMPRESSION: 1. No acute intracranial abnormality. 2. ASPECTS is 10. These results were called by telephone at the time of interpretation on 06/23/2016 at 9:12 pm to [place], [place], who verbally acknowledged these results.  Electronically Signed   By: Ulyses Jarred M.D.   On: 06/23/2016 21:12     EKG: Independently reviewed.  Sinus rhythm, QTC 462, low voltage, nonspecific T-wave change   Assessment/Plan Active Problems:   Stroke (cerebrum) (HCC)   Blood pressure elevated without history of HTN   Stroke-like symptoms   Fx humeral neck  Stroke (cerebrum): pt's symptoms are very concerning for stroke. CT head is negative for acute intracranial abnormalities. Neurology, Dr. Nicole Kindred was consulted, recommended stroke workup and EEG.  -will place on tele bed for obs -highly appreciate Dr. Les Pou consultation, follow-up recommendations as follows: 1. HgbA1c, fasting lipid panel 2. MRI, MRA  of the brain without contrast 3. PT consult, OT consult 4. Echocardiogram 5. Carotid dopplers 6. Prophylactic therapy-Antiplatelet med: Aspirin  7. Risk factor modification 8. Telemetry monitoring 9. EEG, routine adult study  Blood pressure elevated without history of HTN: her Bp is 174/74. Patient may have undiagnosed up attention.  -IV hydralazine for SBP>220 (allow permissive hypertension)  Fx humeral neck: -When necessary Vicodin, Tylenol for pain   DVT ppx: SQ Lovenox Code Status: Full code Family Communication: Yes, patient's son and daughter-in-law at bed side Disposition Plan:  Anticipate discharge back to previous home environment Consults called: Neurology, Dr. Joaquim Lai were  Admission status: Obs / tele  Date of Service 06/23/2016    Ivor Costa [facility] Pager [phone]  If 7PM-7AM, please contact night-coverage [email or web address] Password [email or web address] 06/23/2016, 10:20 PM

## 2016-06-23 NOTE — ED Notes (Signed)
Dr Tressie Ellis in room

## 2016-06-24 ENCOUNTER — Observation Stay (HOSPITAL_COMMUNITY): Payer: Medicare Other

## 2016-06-24 ENCOUNTER — Encounter (HOSPITAL_COMMUNITY): Payer: Self-pay | Admitting: *Deleted

## 2016-06-24 ENCOUNTER — Encounter: Payer: Self-pay | Admitting: *Deleted

## 2016-06-24 ENCOUNTER — Other Ambulatory Visit (HOSPITAL_COMMUNITY): Payer: Medicare Other

## 2016-06-24 DIAGNOSIS — Z9889 Other specified postprocedural states: Secondary | ICD-10-CM | POA: Diagnosis not present

## 2016-06-24 DIAGNOSIS — K5900 Constipation, unspecified: Secondary | ICD-10-CM | POA: Diagnosis present

## 2016-06-24 DIAGNOSIS — S42202S Unspecified fracture of upper end of left humerus, sequela: Secondary | ICD-10-CM | POA: Diagnosis not present

## 2016-06-24 DIAGNOSIS — R000 Tachycardia, unspecified: Secondary | ICD-10-CM | POA: Diagnosis present

## 2016-06-24 DIAGNOSIS — I6302 Cerebral infarction due to thrombosis of basilar artery: Secondary | ICD-10-CM

## 2016-06-24 DIAGNOSIS — I10 Essential (primary) hypertension: Secondary | ICD-10-CM | POA: Diagnosis not present

## 2016-06-24 DIAGNOSIS — Z7982 Long term (current) use of aspirin: Secondary | ICD-10-CM | POA: Diagnosis not present

## 2016-06-24 DIAGNOSIS — Z006 Encounter for examination for normal comparison and control in clinical research program: Secondary | ICD-10-CM

## 2016-06-24 DIAGNOSIS — H518 Other specified disorders of binocular movement: Secondary | ICD-10-CM | POA: Diagnosis present

## 2016-06-24 DIAGNOSIS — R2990 Unspecified symptoms and signs involving the nervous system: Secondary | ICD-10-CM

## 2016-06-24 DIAGNOSIS — R29810 Facial weakness: Secondary | ICD-10-CM | POA: Diagnosis present

## 2016-06-24 DIAGNOSIS — R339 Retention of urine, unspecified: Secondary | ICD-10-CM | POA: Diagnosis not present

## 2016-06-24 DIAGNOSIS — Z6838 Body mass index (BMI) 38.0-38.9, adult: Secondary | ICD-10-CM | POA: Diagnosis not present

## 2016-06-24 DIAGNOSIS — E669 Obesity, unspecified: Secondary | ICD-10-CM | POA: Diagnosis present

## 2016-06-24 DIAGNOSIS — Z800 Family history of malignant neoplasm of digestive organs: Secondary | ICD-10-CM | POA: Diagnosis not present

## 2016-06-24 DIAGNOSIS — S42212D Unspecified displaced fracture of surgical neck of left humerus, subsequent encounter for fracture with routine healing: Secondary | ICD-10-CM

## 2016-06-24 DIAGNOSIS — G8918 Other acute postprocedural pain: Secondary | ICD-10-CM | POA: Diagnosis not present

## 2016-06-24 DIAGNOSIS — Z8249 Family history of ischemic heart disease and other diseases of the circulatory system: Secondary | ICD-10-CM | POA: Diagnosis not present

## 2016-06-24 DIAGNOSIS — R4701 Aphasia: Secondary | ICD-10-CM | POA: Diagnosis present

## 2016-06-24 DIAGNOSIS — Z85820 Personal history of malignant melanoma of skin: Secondary | ICD-10-CM | POA: Diagnosis not present

## 2016-06-24 DIAGNOSIS — Z808 Family history of malignant neoplasm of other organs or systems: Secondary | ICD-10-CM | POA: Diagnosis not present

## 2016-06-24 DIAGNOSIS — I639 Cerebral infarction, unspecified: Secondary | ICD-10-CM | POA: Diagnosis present

## 2016-06-24 DIAGNOSIS — S42295S Other nondisplaced fracture of upper end of left humerus, sequela: Secondary | ICD-10-CM | POA: Diagnosis not present

## 2016-06-24 DIAGNOSIS — I6350 Cerebral infarction due to unspecified occlusion or stenosis of unspecified cerebral artery: Secondary | ICD-10-CM | POA: Diagnosis not present

## 2016-06-24 DIAGNOSIS — R030 Elevated blood-pressure reading, without diagnosis of hypertension: Secondary | ICD-10-CM | POA: Diagnosis present

## 2016-06-24 DIAGNOSIS — G8191 Hemiplegia, unspecified affecting right dominant side: Secondary | ICD-10-CM | POA: Diagnosis present

## 2016-06-24 DIAGNOSIS — E785 Hyperlipidemia, unspecified: Secondary | ICD-10-CM | POA: Diagnosis present

## 2016-06-24 LAB — RAPID URINE DRUG SCREEN, HOSP PERFORMED
AMPHETAMINES: NOT DETECTED
BARBITURATES: NOT DETECTED
Benzodiazepines: NOT DETECTED
Cocaine: NOT DETECTED
Opiates: POSITIVE — AB
TETRAHYDROCANNABINOL: NOT DETECTED

## 2016-06-24 LAB — LIPID PANEL
Cholesterol: 242 mg/dL — ABNORMAL HIGH (ref 0–200)
HDL: 82 mg/dL (ref >40)
LDL CALC: 147 mg/dL — AB (ref 0–99)
TRIGLYCERIDES: 64 mg/dL (ref <150)
Total CHOL/HDL Ratio: 3 RATIO
VLDL: 13 mg/dL (ref 0–40)

## 2016-06-24 LAB — URINALYSIS, ROUTINE W REFLEX MICROSCOPIC
BILIRUBIN URINE: NEGATIVE
Glucose, UA: NEGATIVE mg/dL
Hgb urine dipstick: NEGATIVE
KETONES UR: NEGATIVE mg/dL
NITRITE: NEGATIVE
PH: 5.5 (ref 5.0–8.0)
PROTEIN: NEGATIVE mg/dL
Specific Gravity, Urine: 1.013 (ref 1.005–1.030)

## 2016-06-24 LAB — URINE MICROSCOPIC-ADD ON

## 2016-06-24 LAB — GLUCOSE, CAPILLARY: GLUCOSE-CAPILLARY: 118 mg/dL — AB (ref 65–99)

## 2016-06-24 MED ORDER — ASPIRIN EC 81 MG PO TBEC
81.0000 mg | DELAYED_RELEASE_TABLET | Freq: Every day | ORAL | Status: DC
  Administered 2016-06-24 – 2016-06-26 (×3): 81 mg via ORAL
  Filled 2016-06-24 (×3): qty 1

## 2016-06-24 MED ORDER — ATORVASTATIN CALCIUM 40 MG PO TABS
40.0000 mg | ORAL_TABLET | Freq: Every day | ORAL | Status: DC
  Administered 2016-06-25 – 2016-06-26 (×2): 40 mg via ORAL
  Filled 2016-06-24 (×3): qty 1

## 2016-06-24 MED ORDER — CLOPIDOGREL BISULFATE 75 MG PO TABS
75.0000 mg | ORAL_TABLET | Freq: Every day | ORAL | Status: DC
  Administered 2016-06-24 – 2016-06-26 (×3): 75 mg via ORAL
  Filled 2016-06-24 (×3): qty 1

## 2016-06-24 NOTE — Evaluation (Signed)
Speech Language Pathology Evaluation Patient Details Name: Melanie Hendricks MRN: [id number] DOB: 08/06/1936 Today's Date: 06/24/2016 Time: [id number] SLP Time Calculation (min) (ACUTE ONLY): 16 min  Problem List:  Patient Active Problem List   Diagnosis Date Noted  . Stroke (cerebrum) ([place]) 06/23/2016  . Blood pressure elevated without history of HTN 06/23/2016  . Stroke ([place]) 06/23/2016  . Stroke-like symptoms   . Eye movement disorder   . Closed fracture of neck of humerus with routine healing    Past Medical History:  Past Medical History:  Diagnosis Date  . Fx humeral neck   . Skin melanoma ([place])    Past Surgical History:  Past Surgical History:  Procedure Laterality Date  . TONSILLECTOMY     HPI:  HEYLEY Hendricks a 80 y.o.femalewith medical history significant ofskin melanoma, who presents withslurred speech, deviation of eyes to the right side,right facial droop, right-sided numbness. At baseline, pt is independent for most of ADL. MRI acute ischemic nonhemorrhagic left paramedian pontine infarct, mild chronic microvascular ischemic disease.   Assessment / Plan / Recommendation Clinical Impression  Pt scored within average range on all subtests of the Cognistat standardized assessment. No complaints from pt or son re: cognition. She stated her speech is still slurred at times. SLP introduced speech strategies to improve clarity and precision if environmwent is loud or speaking on the phone. No further ST needed on acute care.      SLP Assessment  Patient does not need any further Speech Lanaguage Pathology Services    Follow Up Recommendations  None    Frequency and Duration           SLP Evaluation Cognition  Overall Cognitive Status: Within Functional Limits for tasks assessed Arousal/Alertness: Awake/alert Orientation Level: Oriented X4 Attention: Sustained Sustained Attention: Appears intact Memory: Appears intact Awareness: Appears  intact Problem Solving: Appears intact Safety/Judgment: Appears intact       Comprehension  Auditory Comprehension Overall Auditory Comprehension: Appears within functional limits for tasks assessed Visual Recognition/Discrimination Discrimination: Not tested Reading Comprehension Reading Status: Not tested    Expression Expression Primary Mode of Expression: Verbal Verbal Expression Overall Verbal Expression: Appears within functional limits for tasks assessed Initiation: No impairment Level of Generative/Spontaneous Verbalization: Conversation Repetition: No impairment Naming: No impairment Pragmatics: No impairment Written Expression Dominant Hand: Right Written Expression: Not tested   Oral / Motor  Oral Motor/Sensory Function Overall Oral Motor/Sensory Function: Mild impairment Facial ROM:  (appeared that left side decreased although left CVA ?) Facial Symmetry: Abnormal symmetry left Lingual ROM: Within Functional Limits Lingual Symmetry: Within Functional Limits Lingual Strength: Within Functional Limits Mandible: Within Functional Limits Motor Speech Overall Motor Speech: Appears within functional limits for tasks assessed (pt complains of mild slurring persisting) Respiration: Within functional limits Phonation: Normal Resonance: Within functional limits Articulation: Within functional limitis Intelligibility: Intelligible Motor Planning: Witnin functional limits   GO                    Houston Siren 06/24/2016, 3:47 PM  Orbie Pyo Traevon Meiring M.Ed [employer] [phone]

## 2016-06-24 NOTE — ED Notes (Signed)
Pt back from MRI 

## 2016-06-24 NOTE — Evaluation (Signed)
Occupational Therapy Evaluation Patient Details Name: Melanie Hendricks MRN: [id number] DOB: [birth year] Today's Date: 06/24/2016    History of Present Illness 80 y.o. female with medical history significant of skin melanoma, who presents with slurred speech, deviation of eyes to the right side, right facial droop, right-sided numbness. Patient fractured her left humerus 5 weeks ago and has kept her left upper extremity in a sling. MRI on 10/17 + for acute L pontine infarct.    Clinical Impression   Pt reports she was independent with ADL PTA. Currently pt overall max assist +2 for ADL and basic transfers, mod assist for UB/seated ADL. Pt presenting with impaired vision, LUE fracture, RUE weakness and impaired coordination, and poor sitting/standing balance impacting her independence and safety with ADL and functional mobility. Recommending CIR level therapies to maximize independence and safety with ADL and functional mobility prior to return home. Pt would benefit from continued skilled OT to address established goals.    Follow Up Recommendations  CIR;Supervision/Assistance - 24 hour    Equipment Recommendations  Other (comment) (TBD)    Recommendations for Other Services Rehab consult     Precautions / Restrictions Precautions Precautions: Fall Required Braces or Orthoses: Sling Restrictions Weight Bearing Restrictions: Yes LUE Weight Bearing: Non weight bearing Other Position/Activity Restrictions: No orders but kept pt NWB throughout session.      Mobility Bed Mobility Overal bed mobility: Needs Assistance;+2 for physical assistance Bed Mobility: Rolling;Sidelying to Sit Rolling: Max assist Sidelying to sit: Max assist;+2 for physical assistance          Transfers Overall transfer level: Needs assistance Equipment used: 2 person hand held assist Transfers: Sit to/from [facility] Sit to Stand: Max assist;+2 physical assistance Stand pivot  transfers: Max assist;+2 physical assistance       General transfer comment: Strong posterior lean in standing.    Balance Overall balance assessment: Needs assistance Sitting-balance support: Feet supported;No upper extremity supported Sitting balance-Leahy Scale: Poor Sitting balance - Comments: Able to balance for a few seconds without UE support. Requires cueing and light physical assist to maintain upright posture Postural control: Posterior lean;Right lateral lean Standing balance support: Single extremity supported Standing balance-Leahy Scale: Poor Standing balance comment: Posterior lean                            ADL Overall ADL's : Needs assistance/impaired   Eating/Feeding Details (indicate cue type and reason): Reports daughter-in law has been feeding today. Encouraged self feeding. Grooming: Moderate assistance;Sitting   Upper Body Bathing: Moderate assistance;Sitting   Lower Body Bathing: Maximal assistance;+2 for physical assistance;Sit to/from stand   Upper Body Dressing : Moderate assistance;Sitting Upper Body Dressing Details (indicate cue type and reason): to don sling Lower Body Dressing: Maximal assistance;Sit to/from stand;+2 for physical assistance   Toilet Transfer: Maximal assistance;+2 for physical assistance;Stand-pivot;BSC Toilet Transfer Details (indicate cue type and reason): Simulated by stand pivot from bed to chair         Functional mobility during ADLs: Maximal assistance;+2 for physical assistance (for stand pivot only)       Vision Additional Comments: Needs further assessment. Pt noted to have slight R gaze preference but able to track to L.   Perception     Praxis      Pertinent Vitals/Pain Pain Assessment: No/denies pain     Hand Dominance Right   Extremity/Trunk Assessment Upper Extremity Assessment Upper Extremity Assessment: RUE deficits/detail;LUE deficits/detail RUE  Deficits / Details: Strength at least  3+/5. Poor gross/fine motor coordination. RUE Coordination: decreased fine motor;decreased gross motor LUE Deficits / Details: Pt able to wiggle fingers; limited evaluation due to immobilzation. LUE: Unable to fully assess due to immobilization   Lower Extremity Assessment Lower Extremity Assessment: Defer to PT evaluation   Cervical / Trunk Assessment Cervical / Trunk Assessment: Kyphotic   Communication Communication Communication: No difficulties   Cognition Arousal/Alertness: Awake/alert Behavior During Therapy: WFL for tasks assessed/performed;Flat affect Overall Cognitive Status: Within Functional Limits for tasks assessed                     General Comments       Exercises       Shoulder Instructions      Home Living Family/patient expects to be discharged to:: Private residence Living Arrangements: Children Available Help at Discharge: Family;Available PRN/intermittently Type of Home: House       Home Layout: One level     Bathroom Shower/Tub: Tub/shower unit Shower/tub characteristics: Architectural technologist: Standard     Home Equipment: None      Lives With: Son (dtr-in-law)    Prior Functioning/Environment Level of Independence: Independent                 OT Problem List: Decreased strength;Decreased range of motion;Decreased activity tolerance;Impaired balance (sitting and/or standing);Impaired vision/perception;Decreased coordination;Decreased knowledge of use of DME or AE;Decreased knowledge of precautions;Impaired UE functional use   OT Treatment/Interventions: Self-care/ADL training;Therapeutic exercise;Neuromuscular education;Energy conservation;DME and/or AE instruction;Therapeutic activities;Visual/perceptual remediation/compensation;Patient/family education;Balance training    OT Goals(Current goals can be found in the care plan section) Acute Rehab OT Goals Patient Stated Goal: get back to being independent OT Goal  Formulation: With patient Time For Goal Achievement: 07/08/16 Potential to Achieve Goals: Good ADL Goals Pt Will Perform Grooming: with min guard assist;sitting Pt Will Transfer to Toilet: ambulating;bedside commode;with mod assist Pt/caregiver will Perform Home Exercise Program: Increased ROM;Both right and left upper extremity;Independently;With written HEP provided Additional ADL Goal #1: Pt will attend to objects in L visual field 100% of the time during ADL with min verbal cues.  OT Frequency: Min 2X/week   Barriers to D/C:            Co-evaluation PT/OT/SLP Co-Evaluation/Treatment: Yes Reason for Co-Treatment: Complexity of the patient's impairments (multi-system involvement);For patient/therapist safety   OT goals addressed during session: ADL's and self-care      End of Session Equipment Utilized During Treatment: Other (comment) (sling)  Activity Tolerance: Patient tolerated treatment well Patient left: in chair;with call bell/phone within reach;with chair alarm set   Time: 1615-1650 OT Time Calculation (min): 35 min Charges:  OT General Charges $OT Visit: 1 Procedure OT Evaluation $OT Eval Moderate Complexity: 1 Procedure G-Codes: OT G-codes **NOT FOR INPATIENT CLASS** Functional Assessment Tool Used: Clinical judgement Functional Limitation: Self care Self Care Current Status [id number]): At least 40 percent but less than 60 percent impaired, limited or restricted Self Care Goal Status [id number]): At least 20 percent but less than 40 percent impaired, limited or restricted   Binnie Kand M.S., OTR/L Pager: [phone]  06/24/2016, 5:44 PM

## 2016-06-24 NOTE — Progress Notes (Signed)
Patient c/o pressure and having to urinate frequently, placed on bed pan several times on urinate a small amount was unable to measure. Bladder scan done results greater than 999. Md notified and orders for urinary cath to be placed related to retention. Cath placed according to policy patient tolerated well and stated she felt much better 800 cc clear yellow urine returned.  Shila Kruczek, Tivis Ringer, RN

## 2016-06-24 NOTE — Procedures (Signed)
ELECTROENCEPHALOGRAM REPORT  Date of Study: 06/24/2016  Patient's Name: Melanie Hendricks MRN: [id number] Date of Birth: 30-May-1936  Referring Provider: Ivor Costa, MD  Clinical History: 80 y.o.femalewith no documented systemic medical disorder presenting with new onset slurred speech, tingling and numbness involving left arm and hand, and deviation of eyes to the right side  Medications: Hospital Medications  stroke: mapping our early stages of recovery book   0.9 % sodium chloride infusion   acetaminophen (TYLENOL) tablet 650 mg   aspirin EC tablet 81 mg   atorvastatin (LIPITOR) tablet 40 mg   clopidogrel (PLAVIX) tablet 75 mg   enoxaparin (LOVENOX) injection 40 mg   hydrALAZINE (APRESOLINE) injection 5 mg   HYDROcodone-acetaminophen (NORCO/VICODIN) 5-325 MG per tablet 1 tablet   ondansetron (ZOFRAN) injection 4 mg   senna-docusate (Senokot-S) tablet 1 tablet   zolpidem (AMBIEN) tablet 5 mg    Technical Summary: A multichannel digital EEG recording measured by the international 10-20 system with electrodes applied with paste and impedances below 5000 ohms performed in our laboratory with EKG monitoring in an awake and drowsy patient.  Photic stimulation was performed but hyperventilation was not performed.  The digital EEG was referentially recorded, reformatted, and digitally filtered in a variety of bipolar and referential montages for optimal display.    Description: The patient is awake and drowsy during the recording.  During maximal wakefulness, there is a symmetric, medium voltage 10 Hz posterior dominant rhythm that attenuates with eye opening.  The record is symmetric.  During drowsiness and sleep, there is an increase in theta slowing of the background.  Vertex waves and symmetric sleep spindles were seen.  Photic stimulation did not elicit any abnormalities.  There were no epileptiform discharges or electrographic seizures seen.    EKG lead was  unremarkable.  Impression: This awake and drowsy EEG is normal.    Clinical Correlation: A normal EEG does not exclude a clinical diagnosis of epilepsy.  If further clinical questions remain, prolonged EEG may be helpful.  Clinical correlation is advised.   Metta Clines, DO

## 2016-06-24 NOTE — Progress Notes (Signed)
EEG completed, results pending. 

## 2016-06-24 NOTE — Progress Notes (Signed)
Subject met inclusion and exclusion criteria for the STROKE-AF research Study.  The informed consent form, study requirements and expectations were reviewed with the subject and questions and concerns were addressed prior to the signing of the consent form.  The subject verbalized understanding of the trail requirements.  The subject agreed to participate in the STROKE AF trial and signed the informed consent.  The informed consent was obtained prior to performance of any protocol-specific procedures for the subject.  A copy of the signed informed consent was given to the subject and a copy was placed in the subject's medical record. Patient was randomized to STANDARD OF CARE arm of Clinical research study.  

## 2016-06-24 NOTE — Progress Notes (Signed)
STROKE TEAM PROGRESS NOTE   HISTORY OF PRESENT ILLNESS (per record) Melanie Hendricks an 80 y.o.femalewith no documented systemic medical disorder presenting with new onset slurred speech, tingling and numbness involving left arm and hand, and deviation of eyes to the right side. Patient has no previous history of stroke nor TIA. She has not been taking antiplatelet medication on a routine basis. CT scan of her head showed no acute intracranial abnormality. No clear facial droop was noted by family. Speech apparently has improved but still not quite back to baseline. Patient fractured her left humerus 5 weeks ago and has kept her left upper extremity in a sling. NIH stroke score was 2. She was LKW at 3:00 PM on 06/23/2016. Patient was not administered IV t-PA secondary to Beyond time under for treatment consideration. She was admitted for further evaluation and treatment.   SUBJECTIVE (INTERVAL HISTORY) Her son Melanie Hendricks is at the bedside.  Overall she feels her condition is stable. Family reported a fall last week.    OBJECTIVE Temp:  [97.5 F (36.4 C)-99 F (37.2 C)] 99 F (37.2 C) (10/18 1130) Pulse Rate:  [96-117] 96 (10/18 1130) Cardiac Rhythm: Sinus tachycardia (10/18 0330) Resp:  [13-29] 18 (10/18 1130) BP: (156-186)/(49-85) 158/56 (10/18 1130) SpO2:  [93 %-99 %] 98 % (10/18 1130) Weight:  [200 lb (90.7 kg)-240 lb 6.4 oz (109 kg)] 240 lb 6.4 oz (109 kg) (10/18 0333)  CBC:   Recent Labs Lab 06/23/16 2121 06/23/16 2134  WBC 7.9  --   NEUTROABS 5.8  --   HGB 13.7 14.3  HCT 40.9 42.0  MCV 90.7  --   PLT 271  --     Basic Metabolic Panel:   Recent Labs Lab 06/23/16 2121 06/23/16 2134  NA 137 139  K 4.1 4.2  CL 106 108  CO2 21*  --   GLUCOSE 127* 124*  BUN 13 16  CREATININE 0.78 0.70  CALCIUM 9.7  --     Lipid Panel:     Component Value Date/Time   CHOL 242 (H) 06/24/2016 0406   TRIG 64 06/24/2016 0406   HDL 82 06/24/2016 0406   CHOLHDL 3.0 06/24/2016 0406   VLDL 13 06/24/2016 0406   LDLCALC 147 (H) 06/24/2016 0406   HgbA1c: No results found for: HGBA1C Urine Drug Screen:     Component Value Date/Time   LABOPIA POSITIVE (A) 06/23/2016 0157   COCAINSCRNUR NONE DETECTED 06/23/2016 0157   LABBENZ NONE DETECTED 06/23/2016 0157   AMPHETMU NONE DETECTED 06/23/2016 0157   THCU NONE DETECTED 06/23/2016 0157   LABBARB NONE DETECTED 06/23/2016 0157      IMAGING  MRI HEAD  06/24/2016 1. 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 2. Mild chronic microvascular ischemic disease.   MRA HEAD  06/24/2016 1. Nonvisualization of the left vertebral artery and majority of the basilar artery, likely occluded. Right vertebral artery patent to the vertebrobasilar junction. PCAs are well perfused, predominantly fetal type in nature and supplied via prominent posterior communicating arteries. 2. Widely patent anterior circulation.   Ct Head Code Stroke W/o Cm 06/23/2016 1. No acute intracranial abnormality. 2. ASPECTS is 10.      PHYSICAL EXAM Pleasant elderly Caucasian lady not in distress. . Afebrile. Head is nontraumatic. Neck is supple without bruit.    Cardiac exam no murmur or gallop. Lungs are clear to auscultation. Distal pulses are well felt.   . Afebrile. Head is nontraumatic. Neck is supple without bruit.    Cardiac  exam no murmur or gallop. Lungs are clear to auscultation. Distal pulses are well felt.Left upper extremity is in a sling due to recent fracture Neurological Exam ;  Awake  Alert oriented x 3. Normal speech and language.eye movements full without nystagmus.fundi were not visualized. Vision acuity and fields appear normal. Hearing is normal. Palatal movements are normal. Face symmetric. Tongue midline. Normal strength, tone, reflexes and coordination except left upper extremity testing limited due to fracture and being in a sling. Normal sensation. Gait deferred.  ASSESSMENT/PLAN Melanie Hendricks is a 80  y.o. female with no significant medical history presenting with slurred speech, numbness and tingling and eye deviation to the R. She did not receive IV t-PA due to delay in arrival.   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 Yes; Fluid consistency: Thin  aspirin 325 mg daily prior to admission, now on aspirin 325 mg daily. Given large vessel intracranial atherosclerosis, patient should be treated with aspirin 81 mg and clopidogrel 75 mg orally every day x 3 months for secondary stroke prevention. After 3 months, change to plavix alone. Long-term dual antiplatelets are contraindicated due to risk for intracerebral hemorrhage.   Patient counseled to be compliant with her antithrombotic medications  Ongoing aggressive stroke risk factor management  Dr. Leonie Man discussed Stroke AF Resesarch Trial with patient and family. Melanie Hendricks will follow up with patient.  Therapy recommendations:  pending   Disposition:  pending   Elevated BP  No hx HTN  Stable from stroke standpoint  Permissive hypertension (OK if < 220/120) but gradually normalize in 5-7 days Long-term BP goal normotensive  Hyperlipidemia  Home meds:  No statin  LDL 146, goal < 70  Added statin  Continue statin at discharge  Other Stroke Risk Factors  Advanced age  Obesity, Body mass index is 38.8 kg/m., recommend weight loss, diet and exercise as appropriate   Other Active Problems  Humeral neck fx  Hospital day # 0 Melanie Hendricks  Melanie Hendricks [place] for Pager information 06/24/2016 12:29 PM  I have personally examined this patient, reviewed notes, independently viewed imaging studies, participated in medical  decision making and plan of care.ROS completed by me personally and pertinent positives fully documented  I have made any additions or clarifications directly to the above note. Agree with note above.  Patient has presented with speech difficulties and transient right-sided weakness and vision difficulties secondary to pontine infarct due to basilar and vertebral artery occlusion. She remains at risk for recurrent stroke, TIA and needs ongoing stroke evaluation. Recommend aspirin and Plavix for 3 months followed by aspirin alone. Aggressive risk factor modification. I had a long discussion with the patient, son and daughter-in-law at the bedside and answered questions. Patient made consider possible participation in the stroke atrial fibrillation trial if interested. Greater than 50% time during this 35 minute visit was spent on counseling and coordination of care about stroke risk, evaluation and treatment Antony Contras, MD Medical Director [facility] Pager: [phone] 06/24/2016 12:32 PM  To contact Stroke Continuity provider, please refer to [email or web address] After hours, contact General Neurology

## 2016-06-24 NOTE — Progress Notes (Signed)
PROGRESS NOTE    Melanie Hendricks  [id number] DOB: December 21, 1935 DOA: 06/23/2016 PCP: Pcp Not In System    Brief Narrative:  Melanie Hendricks is a 80 y.o. female with medical history significant of skin melanoma, who presents with slurred speech, deviation of eyes to the right side, right facial droop, right-sided numbness.  Per her son, pt laid down for a nap around 1500 and woke up around 1600 with slurred speech, right side gaze, right side numbness. She is also mildly disoriented. Patient states that she also has right foot heaviness. No hearing loss. She has nausea, and vomited once. No abdominal pain or diarrhea. Patient denies chest pain, shortness breath, fever, chills, symptoms of UTI. Per EMS, pt was hypertensive with Bp 201/93, sinus tach, 121, RR 20, CBG 128. Pt had left humeral neck fracture which did not require surgery. She still has mild pain and is currently taking Vicodin and Tylenol for pain.  ED Course: pt was found to have WBC 7.9, INR 0.7, negative troponin, pending a urinalysis, electrolytes and renal function okay, temperature normal, tachycardia with heart rate ~110, negative CT head for acute intracranial abnormalities. Patient is placed on telemetry bed for observation. Neurology, Dr. Nicole Kindred was consulted.   Assessment & Plan:   Active Problems:   Stroke (cerebrum) (HCC)   Blood pressure elevated without history of HTN   Stroke-like symptoms   Fx humeral neck   Stroke (cerebrum): pt's symptoms are very concerning for stroke. CT head is negative for acute intracranial abnormalities. Neurology, Dr. Nicole Kindred was consulted, recommended stroke workup and EEG.  - neurology consulted- appreciate their recommendations - HgbA1c pending - fasting lipid panel with LDL of 147 - MRI, MRA of the brain without contrast showed: Acute ischemic nonhemorrhagic left paramedian pontine infarct. Mild chronic microvascular ischemic disease. Nonvisualization of the left  vertebral artery and majority of the basilar artery, likely occluded. Right vertebral artery patent to the vertebrobasilar junction. PCAs are well perfused, predominantly fetal type in nature and supplied via prominent posterior communicating arteries. Widely patent anterior circulation. - PT consult, OT consult (can assess patient as bedrest d/c'ed) - Echocardiogram pending - Carotid dopplers pending - Continue Aspirin  - Risk factor modification - Telemetry monitoring - EEG, routine adult study pending  Blood pressure elevated without history of HTN: her Bp is 174/74. Patient may have undiagnosed up attention.  -IV hydralazine for SBP>220 (allow permissive hypertension)  Fx humeral neck: -When necessary Vicodin, Tylenol for pain - no pain currently  DVT ppx: SQ Lovenox Code Status: Full code Family Communication: Yes, patient's son and daughter-in-law at bed side Disposition Plan: disposition pending recommendations by PT/OT   Consultants:   Neurology  PT  OT  Procedures:   Echocardiogram pending  Antimicrobials:   none    Subjective: Patient seen and evaluated.  Her son and daughter in law are bedside.  She voices she is still experiencing some blurred and double vision.  She mentions that otherwise she is feeling better.  Patient's daughter in law and son voice improvement in patient's symptoms.  Patient denies any headache currently.  Did not eat breakfast as patient states she does not have an appetite.  No concerns voiced in term of chest pain, chest pressure, shortness of breath, palpitations.  Objective: Vitals:   06/24/16 0245 06/24/16 0333 06/24/16 0544 06/24/16 0750  BP: 162/65 (!) 159/52 (!) 170/73 (!) 156/49  Pulse: 107 (!) 104 (!) 105 99  Resp: 16 18 16 16   Temp:  97.7 F (36.5 C) 97.9 F (36.6 C) 98.1 F (36.7 C)  TempSrc:  Oral Oral Oral  SpO2: 95% 98% 98% 98%  Weight:  109 kg (240 lb 6.4 oz)    Height:  5\' 6"  (1.676 m)      Intake/Output  Summary (Last 24 hours) at 06/24/16 0903 Last data filed at 06/24/16 0425  Gross per 24 hour  Intake                0 ml  Output                0 ml  Net                0 ml   Filed Weights   06/23/16 2038 06/24/16 0333  Weight: 90.7 kg (200 lb) 109 kg (240 lb 6.4 oz)    Examination:  General exam: Appears calm and comfortable  Respiratory system: Clear to auscultation. Respiratory effort normal. Cardiovascular system: S1 & S2 heard, RRR. No JVD, murmurs, rubs, gallops or clicks. No pedal edema. Gastrointestinal system: Abdomen is nondistended, soft and nontender. No organomegaly or masses felt. Normal bowel sounds heard. Central nervous system: Alert and oriented. Slight right facial droop- no tongue deviation, no nystagmus, PERRL, EOMI Extremities: Symmetric 5 x 5 power, equal in all extremities but weaker than expected Skin: No rashes, lesions or ulcers Psychiatry: Judgement and insight appear normal. Mood & affect appropriate.     Data Reviewed: I have personally reviewed following labs and imaging studies  CBC:  Recent Labs Lab 06/23/16 2121 06/23/16 2134  WBC 7.9  --   NEUTROABS 5.8  --   HGB 13.7 14.3  HCT 40.9 42.0  MCV 90.7  --   PLT 271  --    Basic Metabolic Panel:  Recent Labs Lab 06/23/16 2121 06/23/16 2134  NA 137 139  K 4.1 4.2  CL 106 108  CO2 21*  --   GLUCOSE 127* 124*  BUN 13 16  CREATININE 0.78 0.70  CALCIUM 9.7  --    GFR: Estimated Creatinine Clearance: 73.6 mL/min (by C-G formula based on SCr of 0.7 mg/dL). Liver Function Tests:  Recent Labs Lab 06/23/16 2121  AST 22  ALT 26  ALKPHOS 73  BILITOT 0.4  PROT 7.1  ALBUMIN 4.0   No results for input(s): LIPASE, AMYLASE in the last 168 hours. No results for input(s): AMMONIA in the last 168 hours. Coagulation Profile:  Recent Labs Lab 06/23/16 2121  INR 0.97   Cardiac Enzymes: No results for input(s): CKTOTAL, CKMB, CKMBINDEX, TROPONINI in the last 168 hours. BNP (last 3  results) No results for input(s): PROBNP in the last 8760 hours. HbA1C: No results for input(s): HGBA1C in the last 72 hours. CBG: No results for input(s): GLUCAP in the last 168 hours. Lipid Profile:  Recent Labs  06/24/16 0406  CHOL 242*  HDL 82  LDLCALC 147*  TRIG 64  CHOLHDL 3.0   Thyroid Function Tests: No results for input(s): TSH, T4TOTAL, FREET4, T3FREE, THYROIDAB in the last 72 hours. Anemia Panel: No results for input(s): VITAMINB12, FOLATE, FERRITIN, TIBC, IRON, RETICCTPCT in the last 72 hours. Sepsis Labs: No results for input(s): PROCALCITON, LATICACIDVEN in the last 168 hours.  No results found for this or any previous visit (from the past 240 hour(s)).       Radiology Studies: Mr Brain Wo Contrast  Result Date: 06/24/2016 CLINICAL DATA:  Initial evaluation for slurred speech. Left-sided numbness. EXAM: MRI  HEAD WITHOUT CONTRAST MRA HEAD WITHOUT CONTRAST TECHNIQUE: Multiplanar, multiecho pulse sequences of the brain and surrounding structures were obtained without intravenous contrast. Angiographic images of the head were obtained using MRA technique without contrast. COMPARISON:  Prior CT from earlier the same day. FINDINGS: MRI HEAD FINDINGS Brain: Cerebral volume within normal limits for age. Patchy and confluent T2/FLAIR hyperintensity within the periventricular and deep white matter both cerebral hemispheres with, most consistent with chronic microvascular ischemic disease. This is fairly mild for age. There is patchy diffusion abnormality within the left paramedian pons, compatible with acute ischemic infarct (series 5, image 16). No associated mass effect or hemorrhage. No other acute infarct identified. Gray-white matter differentiation otherwise maintained. No evidence for acute or chronic intracranial hemorrhage. The no other areas of chronic infarction identified. No mass lesion, midline shift, or mass effect. No hydrocephalus. No extra-axial fluid  collection. Major dural sinuses are grossly patent. Vascular: Abnormal flow void within the left vertebral artery and basilar artery, which may be occluded. Major intracranial vascular flow voids otherwise maintained. Skull and upper cervical spine: Craniocervical junction within normal limits. Visualized upper cervical spine unremarkable. Osseous mineralization normal. No acute scalp soft tissue abnormality. Sinuses/Orbits: Globes and orbital soft tissues within normal limits. Mild scattered mucosal thickening within the ethmoidal air cells. Paranasal sinuses are otherwise clear. Trace opacity within the mastoid air cells bilaterally. Inner ear structures within normal limits. MRA HEAD FINDINGS ANTERIOR CIRCULATION: Distal cervical segments of the internal carotid arteries are patent with antegrade flow. Petrous, cavernous, and supraclinoid segments patent without flow-limiting stenosis. A1 segments patent. Anterior communicating artery within normal limits. Anterior cerebral arteries well opacified to their distal aspects. M1 segments patent without stenosis or occlusion. MCA bifurcations within normal limits. Distal MCA branches well perfused and symmetric. POSTERIOR CIRCULATION: Right vertebral artery patent to the vertebrobasilar junction. Right posterior inferior cerebral artery patent proximally. Left vertebral artery is not visualize, likely occluded. There is apparent occlusion of the basilar artery at the level of the vertebrobasilar junction. There is some patchy distal reconstitution (series 4, image 75). Superior cerebral arteries are patent distally. P1 segments are hypoplastic bilaterally, particularly on the right, with prominent bilateral posterior communicating arteries. PCAs are well perfused and opacified to their distal aspects. No aneurysm or vascular malformation. IMPRESSION: MRI HEAD IMPRESSION: 1. 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 2. Mild chronic microvascular  ischemic disease. MRA HEAD IMPRESSION: 1. Nonvisualization of the left vertebral artery and majority of the basilar artery, likely occluded. Right vertebral artery patent to the vertebrobasilar junction. PCAs are well perfused, predominantly fetal type in nature and supplied via prominent posterior communicating arteries. 2. Widely patent anterior circulation. Results were called by telephone at the time of interpretation on 06/24/2016 at 12:24 am to Dr. Nicole Kindred, who verbally acknowledged these results. Electronically Signed   By: Jeannine Boga M.D.   On: 06/24/2016 00:30   Mr Jodene Nam Head/brain [id number] Cm  Result Date: 06/24/2016 CLINICAL DATA:  Initial evaluation for slurred speech. Left-sided numbness. EXAM: MRI HEAD WITHOUT CONTRAST MRA HEAD WITHOUT CONTRAST TECHNIQUE: Multiplanar, multiecho pulse sequences of the brain and surrounding structures were obtained without intravenous contrast. Angiographic images of the head were obtained using MRA technique without contrast. COMPARISON:  Prior CT from earlier the same day. FINDINGS: MRI HEAD FINDINGS Brain: Cerebral volume within normal limits for age. Patchy and confluent T2/FLAIR hyperintensity within the periventricular and deep white matter both cerebral hemispheres with, most consistent with chronic microvascular ischemic disease. This is fairly mild  for age. There is patchy diffusion abnormality within the left paramedian pons, compatible with acute ischemic infarct (series 5, image 16). No associated mass effect or hemorrhage. No other acute infarct identified. Gray-white matter differentiation otherwise maintained. No evidence for acute or chronic intracranial hemorrhage. The no other areas of chronic infarction identified. No mass lesion, midline shift, or mass effect. No hydrocephalus. No extra-axial fluid collection. Major dural sinuses are grossly patent. Vascular: Abnormal flow void within the left vertebral artery and basilar artery, which may be  occluded. Major intracranial vascular flow voids otherwise maintained. Skull and upper cervical spine: Craniocervical junction within normal limits. Visualized upper cervical spine unremarkable. Osseous mineralization normal. No acute scalp soft tissue abnormality. Sinuses/Orbits: Globes and orbital soft tissues within normal limits. Mild scattered mucosal thickening within the ethmoidal air cells. Paranasal sinuses are otherwise clear. Trace opacity within the mastoid air cells bilaterally. Inner ear structures within normal limits. MRA HEAD FINDINGS ANTERIOR CIRCULATION: Distal cervical segments of the internal carotid arteries are patent with antegrade flow. Petrous, cavernous, and supraclinoid segments patent without flow-limiting stenosis. A1 segments patent. Anterior communicating artery within normal limits. Anterior cerebral arteries well opacified to their distal aspects. M1 segments patent without stenosis or occlusion. MCA bifurcations within normal limits. Distal MCA branches well perfused and symmetric. POSTERIOR CIRCULATION: Right vertebral artery patent to the vertebrobasilar junction. Right posterior inferior cerebral artery patent proximally. Left vertebral artery is not visualize, likely occluded. There is apparent occlusion of the basilar artery at the level of the vertebrobasilar junction. There is some patchy distal reconstitution (series 4, image 75). Superior cerebral arteries are patent distally. P1 segments are hypoplastic bilaterally, particularly on the right, with prominent bilateral posterior communicating arteries. PCAs are well perfused and opacified to their distal aspects. No aneurysm or vascular malformation. IMPRESSION: MRI HEAD IMPRESSION: 1. 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 2. Mild chronic microvascular ischemic disease. MRA HEAD IMPRESSION: 1. Nonvisualization of the left vertebral artery and majority of the basilar artery, likely occluded. Right  vertebral artery patent to the vertebrobasilar junction. PCAs are well perfused, predominantly fetal type in nature and supplied via prominent posterior communicating arteries. 2. Widely patent anterior circulation. Results were called by telephone at the time of interpretation on 06/24/2016 at 12:24 am to Dr. Nicole Kindred, who verbally acknowledged these results. Electronically Signed   By: Jeannine Boga M.D.   On: 06/24/2016 00:30   Ct Head Code Stroke W/o Cm  Result Date: 06/23/2016 CLINICAL DATA:  Code stroke. Left-sided weakness, slurred speech and facial droop EXAM: CT HEAD WITHOUT CONTRAST TECHNIQUE: Contiguous axial images were obtained from the base of the skull through the vertex without intravenous contrast. COMPARISON:  None. FINDINGS: Brain: There is no acute hemorrhage. No evidence of acute cortical infarct. No midline shift or mass effect. There is periventricular hypoattenuation compatible with chronic microvascular disease. No hydrocephalus. Vascular: No hyperdense vessel sign. Skull: Normal. Negative for fracture or focal lesion. Sinuses/Orbits: No fluid levels or advanced mucosal thickening. Normal orbits. Other: Visualized extracranial soft tissues are normal. ASPECTS ([place] Stroke Program Early CT Score) - Ganglionic level infarction (caudate, lentiform nuclei, internal capsule, insula, M1-M3 cortex): 7 - Supraganglionic infarction (M4-M6 cortex): 3 Total score (0-10 with 10 being normal): 10 IMPRESSION: 1. No acute intracranial abnormality. 2. ASPECTS is 10. These results were called by telephone at the time of interpretation on 06/23/2016 at 9:12 pm to Cor[place], PA, who verbally acknowledged these results. Electronically Signed   By: Cletus Gash.D.  On: 06/23/2016 21:12        Scheduled Meds: .  stroke: mapping our early stages of recovery book   Does not apply Once  . aspirin  300 mg Rectal Daily   Or  . aspirin  325 mg Oral Daily  . enoxaparin (LOVENOX)  injection  40 mg Subcutaneous Q24H   Continuous Infusions: . sodium chloride 75 mL/hr at 06/24/16 0425     LOS: 0 days    Time spent: 35 minutes    Newman Pies, MD [facility] Pager [phone]  If 7PM-7AM, please contact night-coverage [email or web address] Password [email or web address] 06/24/2016, 9:03 AM

## 2016-06-24 NOTE — Progress Notes (Signed)
*  PRELIMINARY RESULTS* Vascular Ultrasound Carotid Duplex (Doppler) has been completed.   Study was technically limited due to patient position. There is no obvious evidence of hemodynamically significant internal carotid artery stenosis bilaterally. Vertebral arteries are patent with antegrade flow.  06/24/2016 1:28 PM Maudry Mayhew, BS, RVT, RDCS, RDMS

## 2016-06-24 NOTE — Progress Notes (Signed)
OT Cancellation Note  Patient Details Name: Melanie Hendricks MRN: [id number] DOB: November 21, 1935   Cancelled Treatment:    Reason Eval/Treat Not Completed: Patient not medically ready (active bedrest orders).   Binnie Kand M.S., OTR/L Pager: [phone]  06/24/2016, 8:15 AM

## 2016-06-24 NOTE — Evaluation (Signed)
Physical Therapy Evaluation Patient Details Name: Melanie Hendricks MRN: [id number] DOB: December 10, 1935 Today's Date: 06/24/2016   History of Present Illness  80 y.o. female with medical history significant of skin melanoma, who presents with slurred speech, deviation of eyes to the right side, right facial droop, right-sided numbness. Patient fractured her left humerus 5 weeks ago and has kept her left upper extremity in a sling. MRI on 10/17 + for acute L pontine infarct.   Clinical Impression  Pt admitted with/for R sided signs of stroke, confirmed as L pontine infarct on MRI.  Pt currently limited functionally due to the problems listed. ( See problems list.)   Pt will benefit from PT to maximize function and safety in order to get ready for next venue listed below.     Follow Up Recommendations CIR    Equipment Recommendations   (TBA)    Recommendations for Other Services Rehab consult     Precautions / Restrictions Precautions Precautions: Fall Required Braces or Orthoses: Sling Restrictions Weight Bearing Restrictions: Yes LUE Weight Bearing: Non weight bearing Other Position/Activity Restrictions: No orders but kept pt NWB throughout session.      Mobility  Bed Mobility Overal bed mobility: Needs Assistance;+2 for physical assistance Bed Mobility: Rolling;Sidelying to Sit Rolling: Max assist Sidelying to sit: Max assist;+2 for physical assistance          Transfers Overall transfer level: Needs assistance Equipment used: 2 person hand held assist Transfers: Sit to/from [facility] Sit to Stand: Max assist;+2 physical assistance Stand pivot transfers: Max assist;+2 physical assistance       General transfer comment: Strong posterior lean in standing.  Ambulation/Gait             General Gait Details: not able  Stairs            Wheelchair Mobility    Modified Rankin (Stroke Patients Only) Modified Rankin (Stroke Patients  Only) Pre-Morbid Rankin Score: No symptoms Modified Rankin: Severe disability     Balance Overall balance assessment: Needs assistance Sitting-balance support: Single extremity supported Sitting balance-Leahy Scale: Poor Sitting balance - Comments: Able to balance for a few seconds without UE support. Requires cueing and light physical assist to maintain upright posture Postural control: Posterior lean;Right lateral lean Standing balance support: Single extremity supported Standing balance-Leahy Scale: Poor Standing balance comment: posterior lean                             Pertinent Vitals/Pain Pain Assessment: No/denies pain    Home Living Family/patient expects to be discharged to:: Private residence Living Arrangements: Children Available Help at Discharge: Family;Available PRN/intermittently Type of Home: House       Home Layout: One level Home Equipment: None      Prior Function Level of Independence: Independent               Hand Dominance   Dominant Hand: Right    Extremity/Trunk Assessment   Upper Extremity Assessment: RUE deficits/detail;LUE deficits/detail RUE Deficits / Details: Strength at least 3+/5. Poor gross/fine motor coordination.     LUE Deficits / Details: Pt able to wiggle fingers; limited evaluation due to immobilzation.   Lower Extremity Assessment: RLE deficits/detail;LLE deficits/detail RLE Deficits / Details: movement mildly uncoordinated, hip flexors 4-, quads 4, hams 4, df/pf 4-/5.  In standing, pt unable to control the knee in stance. LLE Deficits / Details: 4+/5 strength grossly except 4/5 hip flexors, otherwise  functional  Cervical / Trunk Assessment: Kyphotic  Communication   Communication: No difficulties  Cognition Arousal/Alertness: Awake/alert Behavior During Therapy: WFL for tasks assessed/performed;Flat affect Overall Cognitive Status: Within Functional Limits for tasks assessed                       General Comments      Exercises     Assessment/Plan    PT Assessment Patient needs continued PT services  PT Problem List Decreased strength;Decreased activity tolerance;Decreased balance;Decreased mobility;Decreased coordination;Decreased knowledge of use of DME          PT Treatment Interventions DME instruction;Gait training;Functional mobility training;Therapeutic activities;Balance training;Neuromuscular re-education;Patient/family education    PT Goals (Current goals can be found in the Care Plan section)  Acute Rehab PT Goals Patient Stated Goal: get back to being independent PT Goal Formulation: With patient Time For Goal Achievement: 07/08/16 Potential to Achieve Goals: Good    Frequency Min 3X/week   Barriers to discharge        Co-evaluation PT/OT/SLP Co-Evaluation/Treatment: Yes Reason for Co-Treatment: Complexity of the patient's impairments (multi-system involvement) PT goals addressed during session: Mobility/safety with mobility OT goals addressed during session: ADL's and self-care       End of Session   Activity Tolerance: Patient tolerated treatment well Patient left: in chair;with call bell/phone within reach;with chair alarm set Nurse Communication: Mobility status         Time: [id number] PT Time Calculation (min) (ACUTE ONLY): 35 min   Charges:   PT Evaluation $PT Eval Moderate Complexity: 1 Procedure     PT G Codes:        Blyss Lugar, Tessie Fass 06/24/2016, 6:32 PM 06/24/2016  Donnella Sham, PT [phone]  (pager)

## 2016-06-25 ENCOUNTER — Encounter (HOSPITAL_COMMUNITY): Payer: Self-pay | Admitting: General Practice

## 2016-06-25 ENCOUNTER — Inpatient Hospital Stay (HOSPITAL_COMMUNITY): Payer: Medicare Other

## 2016-06-25 DIAGNOSIS — I6302 Cerebral infarction due to thrombosis of basilar artery: Secondary | ICD-10-CM

## 2016-06-25 DIAGNOSIS — I6350 Cerebral infarction due to unspecified occlusion or stenosis of unspecified cerebral artery: Secondary | ICD-10-CM

## 2016-06-25 LAB — VAS US CAROTID
LCCADSYS: -127 cm/s
LCCAPDIAS: 22 cm/s
LCCAPSYS: 132 cm/s
LEFT ECA DIAS: -14 cm/s
LICADDIAS: -22 cm/s
Left CCA dist dias: -21 cm/s
Left ICA dist sys: -104 cm/s
Left ICA prox dias: -21 cm/s
Left ICA prox sys: -90 cm/s
RCCADSYS: -105 cm/s
RCCAPDIAS: 11 cm/s
RIGHT ECA DIAS: -13 cm/s
RIGHT VERTEBRAL DIAS: 9 cm/s
Right CCA prox sys: 112 cm/s

## 2016-06-25 LAB — GLUCOSE, CAPILLARY: Glucose-Capillary: 100 mg/dL — ABNORMAL HIGH (ref 65–99)

## 2016-06-25 LAB — ECHOCARDIOGRAM COMPLETE
HEIGHTINCHES: 66 in
WEIGHTICAEL: 3846.4 [oz_av]

## 2016-06-25 NOTE — Progress Notes (Signed)
PROGRESS NOTE    Melanie Hendricks  [id number] DOB: 02/11/1936 DOA: 06/23/2016 PCP: Pcp Not In System    Brief Narrative:  Melanie Hendricks is a 80 y.o. female with medical history significant of skin melanoma, who presents with slurred speech, deviation of eyes to the right side, right facial droop, right-sided numbness.  Per her son, pt laid down for a nap around 1500 and woke up around 1600 with slurred speech, right side gaze, right side numbness. She is also mildly disoriented. Patient states that she also has right foot heaviness. No hearing loss. She has nausea, and vomited once. No abdominal pain or diarrhea. Patient denies chest pain, shortness breath, fever, chills, symptoms of UTI. Per EMS, pt was hypertensive with Bp 201/93, sinus tach, 121, RR 20, CBG 128. Pt had left humeral neck fracture which did not require surgery. She still has mild pain and is currently taking Vicodin and Tylenol for pain.  ED Course: pt was found to have WBC 7.9, INR 0.7, negative troponin, pending a urinalysis, electrolytes and renal function okay, temperature normal, tachycardia with heart rate ~110, negative CT head for acute intracranial abnormalities. Patient is placed on telemetry bed for observation. Neurology, Dr. Nicole Kindred was consulted.   Assessment & Plan:   Active Problems:   Stroke (cerebrum) (HCC)   Blood pressure elevated without history of HTN   Stroke-like symptoms   Closed fracture of neck of humerus with routine healing   Stroke (cerebrum): pt's symptoms are very concerning for stroke. CT head is negative for acute intracranial abnormalities. Neurology, Dr. Nicole Kindred was consulted, recommended stroke workup and EEG.  - neurology consulted- appreciate their recommendations - HgbA1c pending- still not back - fasting lipid panel with LDL of 147 - MRI, MRA of the brain without contrast showed: Acute ischemic nonhemorrhagic left paramedian pontine infarct. Mild chronic  microvascular ischemic disease. Nonvisualization of the left vertebral artery and majority of the basilar artery, likely occluded. Right vertebral artery patent to the vertebrobasilar junction. PCAs are well perfused, predominantly fetal type in nature and supplied via prominent posterior communicating arteries. Widely patent anterior circulation. - PT consult, OT consult (can assess patient as bedrest d/c'ed) - Echocardiogram pending - Carotid dopplers: There is no obvious evidence of hemodynamically significant, internal carotid artery stenosis bilaterally. Vertebral arteries are patent with antegrade flow. - Continue Aspirin  - Risk factor modification - Telemetry monitoring - EEG, routine adult study pending  Blood pressure elevated without history of HTN: her Bp is 174/74. Patient may have undiagnosed up attention.  -IV hydralazine for SBP>220 (allow permissive hypertension)  Fx humeral neck: -When necessary Vicodin, Tylenol for pain - no pain currently  DVT ppx: SQ Lovenox Code Status: Full code Family Communication: Yes, patient's son and daughter-in-law at bed side Disposition Plan: discharge to either SNF or Inpt rehab likely tomorrow when Echocardiogram resulted   Consultants:   Neurology  PT  OT  Procedures:   Echocardiogram pending  Antimicrobials:   none    Subjective: Patient seen and evaluated.  She is waiting on echocardiogram to be performed today.  Interested in inpatient rehab or rehab at a facility.  Does not want to go home as she is fearful she will not be successful at home.  Family including son, daughter in law and daughter are bedside.  Objective: Vitals:   06/25/16 0536 06/25/16 1021 06/25/16 1402 06/25/16 1723  BP: (!) 160/64 (!) 145/50 (!) 178/47 (!) 154/57  Pulse: 98 (!) 106 (!) 105 (!) 103  Resp: 18 20 20 20   Temp: 98.6 F (37 C) 97.7 F (36.5 C) 98.4 F (36.9 C) 98.5 F (36.9 C)  TempSrc: Oral Oral Oral Oral  SpO2: 98% 97% 97%  97%  Weight:      Height:        Intake/Output Summary (Last 24 hours) at 06/25/16 1740 Last data filed at 06/25/16 1726  Gross per 24 hour  Intake          2043.75 ml  Output             2550 ml  Net          -506.25 ml   Filed Weights   06/23/16 2038 06/24/16 0333  Weight: 90.7 kg (200 lb) 109 kg (240 lb 6.4 oz)    Examination:  General exam: Appears calm and comfortable  Respiratory system: Clear to auscultation. Respiratory effort normal. Cardiovascular system: S1 & S2 heard, RRR. No JVD, murmurs, rubs, gallops or clicks. No pedal edema. Gastrointestinal system: Abdomen is nondistended, soft and nontender. No organomegaly or masses felt. Normal bowel sounds heard. Central nervous system: Alert and oriented. Slight right facial droop- no tongue deviation, no nystagmus, PERRL, EOMI Extremities: Symmetric 5 x 5 power, equal in all extremities but weaker than expected Skin: No rashes, lesions or ulcers Psychiatry: Judgement and insight appear normal. Mood & affect appropriate.     Data Reviewed: I have personally reviewed following labs and imaging studies  CBC:  Recent Labs Lab 06/23/16 2121 06/23/16 2134  WBC 7.9  --   NEUTROABS 5.8  --   HGB 13.7 14.3  HCT 40.9 42.0  MCV 90.7  --   PLT 271  --    Basic Metabolic Panel:  Recent Labs Lab 06/23/16 2121 06/23/16 2134  NA 137 139  K 4.1 4.2  CL 106 108  CO2 21*  --   GLUCOSE 127* 124*  BUN 13 16  CREATININE 0.78 0.70  CALCIUM 9.7  --    GFR: Estimated Creatinine Clearance: 73.6 mL/min (by C-G formula based on SCr of 0.7 mg/dL). Liver Function Tests:  Recent Labs Lab 06/23/16 2121  AST 22  ALT 26  ALKPHOS 73  BILITOT 0.4  PROT 7.1  ALBUMIN 4.0   No results for input(s): LIPASE, AMYLASE in the last 168 hours. No results for input(s): AMMONIA in the last 168 hours. Coagulation Profile:  Recent Labs Lab 06/23/16 2121  INR 0.97   Cardiac Enzymes: No results for input(s): CKTOTAL, CKMB,  CKMBINDEX, TROPONINI in the last 168 hours. BNP (last 3 results) No results for input(s): PROBNP in the last 8760 hours. HbA1C: No results for input(s): HGBA1C in the last 72 hours. CBG:  Recent Labs Lab 06/24/16 1137 06/25/16 0605  GLUCAP 118* 100*   Lipid Profile:  Recent Labs  06/24/16 0406  CHOL 242*  HDL 82  LDLCALC 147*  TRIG 64  CHOLHDL 3.0   Thyroid Function Tests: No results for input(s): TSH, T4TOTAL, FREET4, T3FREE, THYROIDAB in the last 72 hours. Anemia Panel: No results for input(s): VITAMINB12, FOLATE, FERRITIN, TIBC, IRON, RETICCTPCT in the last 72 hours. Sepsis Labs: No results for input(s): PROCALCITON, LATICACIDVEN in the last 168 hours.  No results found for this or any previous visit (from the past 240 hour(s)).       Radiology Studies: Mr Brain Wo Contrast  Result Date: 06/24/2016 CLINICAL DATA:  Initial evaluation for slurred speech. Left-sided numbness. EXAM: MRI HEAD WITHOUT CONTRAST MRA HEAD WITHOUT CONTRAST TECHNIQUE:  Multiplanar, multiecho pulse sequences of the brain and surrounding structures were obtained without intravenous contrast. Angiographic images of the head were obtained using MRA technique without contrast. COMPARISON:  Prior CT from earlier the same day. FINDINGS: MRI HEAD FINDINGS Brain: Cerebral volume within normal limits for age. Patchy and confluent T2/FLAIR hyperintensity within the periventricular and deep white matter both cerebral hemispheres with, most consistent with chronic microvascular ischemic disease. This is fairly mild for age. There is patchy diffusion abnormality within the left paramedian pons, compatible with acute ischemic infarct (series 5, image 16). No associated mass effect or hemorrhage. No other acute infarct identified. Gray-white matter differentiation otherwise maintained. No evidence for acute or chronic intracranial hemorrhage. The no other areas of chronic infarction identified. No mass lesion,  midline shift, or mass effect. No hydrocephalus. No extra-axial fluid collection. Major dural sinuses are grossly patent. Vascular: Abnormal flow void within the left vertebral artery and basilar artery, which may be occluded. Major intracranial vascular flow voids otherwise maintained. Skull and upper cervical spine: Craniocervical junction within normal limits. Visualized upper cervical spine unremarkable. Osseous mineralization normal. No acute scalp soft tissue abnormality. Sinuses/Orbits: Globes and orbital soft tissues within normal limits. Mild scattered mucosal thickening within the ethmoidal air cells. Paranasal sinuses are otherwise clear. Trace opacity within the mastoid air cells bilaterally. Inner ear structures within normal limits. MRA HEAD FINDINGS ANTERIOR CIRCULATION: Distal cervical segments of the internal carotid arteries are patent with antegrade flow. Petrous, cavernous, and supraclinoid segments patent without flow-limiting stenosis. A1 segments patent. Anterior communicating artery within normal limits. Anterior cerebral arteries well opacified to their distal aspects. M1 segments patent without stenosis or occlusion. MCA bifurcations within normal limits. Distal MCA branches well perfused and symmetric. POSTERIOR CIRCULATION: Right vertebral artery patent to the vertebrobasilar junction. Right posterior inferior cerebral artery patent proximally. Left vertebral artery is not visualize, likely occluded. There is apparent occlusion of the basilar artery at the level of the vertebrobasilar junction. There is some patchy distal reconstitution (series 4, image 75). Superior cerebral arteries are patent distally. P1 segments are hypoplastic bilaterally, particularly on the right, with prominent bilateral posterior communicating arteries. PCAs are well perfused and opacified to their distal aspects. No aneurysm or vascular malformation. IMPRESSION: MRI HEAD IMPRESSION: 1. Acute ischemic  nonhemorrhagic left paramedian pontine infarct. 2. Mild chronic microvascular ischemic disease. MRA HEAD IMPRESSION: 1. Nonvisualization of the left vertebral artery and majority of the basilar artery, likely occluded. Right vertebral artery patent to the vertebrobasilar junction. PCAs are well perfused, predominantly fetal type in nature and supplied via prominent posterior communicating arteries. 2. Widely patent anterior circulation. Results were called by telephone at the time of interpretation on 06/24/2016 at 12:24 am to Dr. Nicole Kindred, who verbally acknowledged these results. Electronically Signed   By: Jeannine Boga M.D.   On: 06/24/2016 00:30   Mr Jodene Nam Head/brain [id number] Cm  Result Date: 06/24/2016 CLINICAL DATA:  Initial evaluation for slurred speech. Left-sided numbness. EXAM: MRI HEAD WITHOUT CONTRAST MRA HEAD WITHOUT CONTRAST TECHNIQUE: Multiplanar, multiecho pulse sequences of the brain and surrounding structures were obtained without intravenous contrast. Angiographic images of the head were obtained using MRA technique without contrast. COMPARISON:  Prior CT from earlier the same day. FINDINGS: MRI HEAD FINDINGS Brain: Cerebral volume within normal limits for age. Patchy and confluent T2/FLAIR hyperintensity within the periventricular and deep white matter both cerebral hemispheres with, most consistent with chronic microvascular ischemic disease. This is fairly mild for age. There is patchy diffusion abnormality within  the left paramedian pons, compatible with acute ischemic infarct (series 5, image 16). No associated mass effect or hemorrhage. No other acute infarct identified. Gray-white matter differentiation otherwise maintained. No evidence for acute or chronic intracranial hemorrhage. The no other areas of chronic infarction identified. No mass lesion, midline shift, or mass effect. No hydrocephalus. No extra-axial fluid collection. Major dural sinuses are grossly patent. Vascular: Abnormal  flow void within the left vertebral artery and basilar artery, which may be occluded. Major intracranial vascular flow voids otherwise maintained. Skull and upper cervical spine: Craniocervical junction within normal limits. Visualized upper cervical spine unremarkable. Osseous mineralization normal. No acute scalp soft tissue abnormality. Sinuses/Orbits: Globes and orbital soft tissues within normal limits. Mild scattered mucosal thickening within the ethmoidal air cells. Paranasal sinuses are otherwise clear. Trace opacity within the mastoid air cells bilaterally. Inner ear structures within normal limits. MRA HEAD FINDINGS ANTERIOR CIRCULATION: Distal cervical segments of the internal carotid arteries are patent with antegrade flow. Petrous, cavernous, and supraclinoid segments patent without flow-limiting stenosis. A1 segments patent. Anterior communicating artery within normal limits. Anterior cerebral arteries well opacified to their distal aspects. M1 segments patent without stenosis or occlusion. MCA bifurcations within normal limits. Distal MCA branches well perfused and symmetric. POSTERIOR CIRCULATION: Right vertebral artery patent to the vertebrobasilar junction. Right posterior inferior cerebral artery patent proximally. Left vertebral artery is not visualize, likely occluded. There is apparent occlusion of the basilar artery at the level of the vertebrobasilar junction. There is some patchy distal reconstitution (series 4, image 75). Superior cerebral arteries are patent distally. P1 segments are hypoplastic bilaterally, particularly on the right, with prominent bilateral posterior communicating arteries. PCAs are well perfused and opacified to their distal aspects. No aneurysm or vascular malformation. IMPRESSION: MRI HEAD IMPRESSION: 1. 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 2. Mild chronic microvascular ischemic disease. MRA HEAD IMPRESSION: 1. Nonvisualization of the left  vertebral artery and majority of the basilar artery, likely occluded. Right vertebral artery patent to the vertebrobasilar junction. PCAs are well perfused, predominantly fetal type in nature and supplied via prominent posterior communicating arteries. 2. Widely patent anterior circulation. Results were called by telephone at the time of interpretation on 06/24/2016 at 12:24 am to Dr. Nicole Kindred, who verbally acknowledged these results. Electronically Signed   By: Jeannine Boga M.D.   On: 06/24/2016 00:30   Ct Head Code Stroke W/o Cm  Result Date: 06/23/2016 CLINICAL DATA:  Code stroke. Left-sided weakness, slurred speech and facial droop EXAM: CT HEAD WITHOUT CONTRAST TECHNIQUE: Contiguous axial images were obtained from the base of the skull through the vertex without intravenous contrast. COMPARISON:  None. FINDINGS: Brain: There is no acute hemorrhage. No evidence of acute cortical infarct. No midline shift or mass effect. There is periventricular hypoattenuation compatible with chronic microvascular disease. No hydrocephalus. Vascular: No hyperdense vessel sign. Skull: Normal. Negative for fracture or focal lesion. Sinuses/Orbits: No fluid levels or advanced mucosal thickening. Normal orbits. Other: Visualized extracranial soft tissues are normal. ASPECTS ([place] Stroke Program Early CT Score) - Ganglionic level infarction (caudate, lentiform nuclei, internal capsule, insula, M1-M3 cortex): 7 - Supraganglionic infarction (M4-M6 cortex): 3 Total score (0-10 with 10 being normal): 10 IMPRESSION: 1. No acute intracranial abnormality. 2. ASPECTS is 10. These results were called by telephone at the time of interpretation on 06/23/2016 at 9:12 pm to [place], PA, who verbally acknowledged these results. Electronically Signed   By: Ulyses Jarred M.D.   On: 06/23/2016 21:12  Scheduled Meds: .  stroke: mapping our early stages of recovery book   Does not apply Once  . aspirin EC  81 mg  Oral Daily  . atorvastatin  40 mg Oral q1800  . clopidogrel  75 mg Oral Daily  . enoxaparin (LOVENOX) injection  40 mg Subcutaneous Q24H   Continuous Infusions: . sodium chloride 75 mL (06/24/16 1739)     LOS: 1 day    Time spent: 35 minutes    Newman Pies, MD [facility] Pager [phone]  If 7PM-7AM, please contact night-coverage [email or web address] Password [email or web address] 06/25/2016, 5:40 PM

## 2016-06-25 NOTE — Discharge Summary (Signed)
Physician Discharge Summary  Melanie Hendricks [id number] DOB: 1936/06/11 DOA: 06/23/2016  PCP: Pcp Not In System  Admit date: 06/23/2016 Discharge date: 06/26/2016  Admitted From:  Home  Disposition:  Inpatient Rehab  Recommendations for Outpatient Follow-up:  1. Follow up with PCP in 1-2 weeks 2. Take aspirin and plavix for 3 months and then stop aspirin and take only plavix 3. PT/OT per inpatient rehab   Home Health: No Equipment/Devices:None  Discharge Condition: stable CODE STATUS:Full Code Diet recommendation: Heart Healthy / Carb Modified  Brief/Interim Summary: Melanie Hendricks a 80 y.o.femalewith medical history significant ofskin melanoma, who presents withslurred speech, deviation of eyes to the right side,right facial droop, right-sided numbness.  Per her son, pt laid down for a nap around 1500 and woke up around 1600 with slurred speech, right side gaze, right side numbness. She is also mildly disoriented. Patient states that she also has right foot heaviness. No hearing loss. She has nausea, and vomited once. No abdominal pain or diarrhea. Patient denies chest pain, shortness breath, fever, chills, symptoms of UTI. Per EMS, pt was hypertensive with Bp201/93, sinus tach, 121, RR 20, CBG 128. Pt had left humeral neck fracture which did not require surgery. She still has mild pain and is currently taking Vicodin and Tylenol for pain.  ED Course:pt was found to have WBC 7.9, INR 0.7, negative troponin, pending a urinalysis, electrolytes and renal function okay, temperature normal, tachycardia with heart rate ~110, negative CT head for acute intracranial abnormalities. Patient is placed on telemetry bed for observation. Neurology, Dr. Lequita Halt consulted.  Patient stroke workup performed and she was started on plavix and aspirin.  Study results below.  Patient also evaluated by inpatient rehab and was found to be a candidate for discharge there.  She was  discharged to inpatient rehab. Patient was instructed to take 3 months of dual antiplatelet medications (aspirin and Plavix) and then stop aspirin after 3 months.  She will follow up with Dr. Leonie Man outpatient in 6 weeks.  Discharge Diagnoses:  Active Problems:   Stroke (cerebrum) (HCC)   Blood pressure elevated without history of HTN   Stroke-like symptoms   Closed fracture of neck of humerus with routine healing    Discharge Instructions  Discharge Instructions    Ambulatory referral to Neurology    Complete by:  As directed    Stroke patient. Dr. Leonie Man prefers follow up in 6 weeks   Call MD for:  difficulty breathing, headache or visual disturbances    Complete by:  As directed    Call MD for:  extreme fatigue    Complete by:  As directed    Call MD for:  persistant dizziness or light-headedness    Complete by:  As directed    Call MD for:  persistant nausea and vomiting    Complete by:  As directed    Call MD for:  severe uncontrolled pain    Complete by:  As directed    Call MD for:  temperature >100.4    Complete by:  As directed    Diet - low sodium heart healthy    Complete by:  As directed    Diet - low sodium heart healthy    Complete by:  As directed    Increase activity slowly    Complete by:  As directed    Increase activity slowly    Complete by:  As directed        Medication List  TAKE these medications   acetaminophen 325 MG tablet Commonly known as:  TYLENOL Take 325 mg by mouth every 6 (six) hours as needed for mild pain or headache.   aspirin EC 325 MG tablet Take 325 mg by mouth daily as needed for moderate pain.   atorvastatin 40 MG tablet Commonly known as:  LIPITOR Take 1 tablet (40 mg total) by mouth daily at 6 PM.   clopidogrel 75 MG tablet Commonly known as:  PLAVIX Take 1 tablet (75 mg total) by mouth daily.   HYDROcodone-acetaminophen 5-325 MG tablet Commonly known as:  NORCO/VICODIN Take 1 tablet by mouth every 4 (four) hours  as needed.   ondansetron 4 MG disintegrating tablet Commonly known as:  ZOFRAN ODT Take 1 tablet (4 mg total) by mouth every 8 (eight) hours as needed for nausea or vomiting.   senna-docusate 8.6-50 MG tablet Commonly known as:  Senokot-S Take 1 tablet by mouth at bedtime as needed for mild constipation.      Follow-up Information    SETHI,PRAMOD, MD Follow up in 6 week(s).   Specialties:  Neurology, Radiology Why:  stroke clinic office will contact you with appt date and time. Contact information: [address] [place] [address] [phone]          No Known Allergies  Consultations:  Neurology  PT/OT  Inpatient Rehab   Procedures/Studies: Mr Brain Wo Contrast  Result Date: 06/24/2016 CLINICAL DATA:  Initial evaluation for slurred speech. Left-sided numbness. EXAM: MRI HEAD WITHOUT CONTRAST MRA HEAD WITHOUT CONTRAST TECHNIQUE: Multiplanar, multiecho pulse sequences of the brain and surrounding structures were obtained without intravenous contrast. Angiographic images of the head were obtained using MRA technique without contrast. COMPARISON:  Prior CT from earlier the same day. FINDINGS: MRI HEAD FINDINGS Brain: Cerebral volume within normal limits for age. Patchy and confluent T2/FLAIR hyperintensity within the periventricular and deep white matter both cerebral hemispheres with, most consistent with chronic microvascular ischemic disease. This is fairly mild for age. There is patchy diffusion abnormality within the left paramedian pons, compatible with acute ischemic infarct (series 5, image 16). No associated mass effect or hemorrhage. No other acute infarct identified. Gray-white matter differentiation otherwise maintained. No evidence for acute or chronic intracranial hemorrhage. The no other areas of chronic infarction identified. No mass lesion, midline shift, or mass effect. No hydrocephalus. No extra-axial fluid collection. Major dural sinuses are  grossly patent. Vascular: Abnormal flow void within the left vertebral artery and basilar artery, which may be occluded. Major intracranial vascular flow voids otherwise maintained. Skull and upper cervical spine: Craniocervical junction within normal limits. Visualized upper cervical spine unremarkable. Osseous mineralization normal. No acute scalp soft tissue abnormality. Sinuses/Orbits: Globes and orbital soft tissues within normal limits. Mild scattered mucosal thickening within the ethmoidal air cells. Paranasal sinuses are otherwise clear. Trace opacity within the mastoid air cells bilaterally. Inner ear structures within normal limits. MRA HEAD FINDINGS ANTERIOR CIRCULATION: Distal cervical segments of the internal carotid arteries are patent with antegrade flow. Petrous, cavernous, and supraclinoid segments patent without flow-limiting stenosis. A1 segments patent. Anterior communicating artery within normal limits. Anterior cerebral arteries well opacified to their distal aspects. M1 segments patent without stenosis or occlusion. MCA bifurcations within normal limits. Distal MCA branches well perfused and symmetric. POSTERIOR CIRCULATION: Right vertebral artery patent to the vertebrobasilar junction. Right posterior inferior cerebral artery patent proximally. Left vertebral artery is not visualize, likely occluded. There is apparent occlusion of the basilar artery at the level  of the vertebrobasilar junction. There is some patchy distal reconstitution (series 4, image 75). Superior cerebral arteries are patent distally. P1 segments are hypoplastic bilaterally, particularly on the right, with prominent bilateral posterior communicating arteries. PCAs are well perfused and opacified to their distal aspects. No aneurysm or vascular malformation. IMPRESSION: MRI HEAD IMPRESSION: 1. 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 2. Mild chronic microvascular ischemic disease. MRA HEAD IMPRESSION:  1. Nonvisualization of the left vertebral artery and majority of the basilar artery, likely occluded. Right vertebral artery patent to the vertebrobasilar junction. PCAs are well perfused, predominantly fetal type in nature and supplied via prominent posterior communicating arteries. 2. Widely patent anterior circulation. Results were called by telephone at the time of interpretation on 06/24/2016 at 12:24 am to Dr. Nicole Kindred, who verbally acknowledged these results. Electronically Signed   By: Jeannine Boga M.D.   On: 06/24/2016 00:30   Mr Jodene Nam Head/brain [id number] Cm  Result Date: 06/24/2016 CLINICAL DATA:  Initial evaluation for slurred speech. Left-sided numbness. EXAM: MRI HEAD WITHOUT CONTRAST MRA HEAD WITHOUT CONTRAST TECHNIQUE: Multiplanar, multiecho pulse sequences of the brain and surrounding structures were obtained without intravenous contrast. Angiographic images of the head were obtained using MRA technique without contrast. COMPARISON:  Prior CT from earlier the same day. FINDINGS: MRI HEAD FINDINGS Brain: Cerebral volume within normal limits for age. Patchy and confluent T2/FLAIR hyperintensity within the periventricular and deep white matter both cerebral hemispheres with, most consistent with chronic microvascular ischemic disease. This is fairly mild for age. There is patchy diffusion abnormality within the left paramedian pons, compatible with acute ischemic infarct (series 5, image 16). No associated mass effect or hemorrhage. No other acute infarct identified. Gray-white matter differentiation otherwise maintained. No evidence for acute or chronic intracranial hemorrhage. The no other areas of chronic infarction identified. No mass lesion, midline shift, or mass effect. No hydrocephalus. No extra-axial fluid collection. Major dural sinuses are grossly patent. Vascular: Abnormal flow void within the left vertebral artery and basilar artery, which may be occluded. Major intracranial vascular  flow voids otherwise maintained. Skull and upper cervical spine: Craniocervical junction within normal limits. Visualized upper cervical spine unremarkable. Osseous mineralization normal. No acute scalp soft tissue abnormality. Sinuses/Orbits: Globes and orbital soft tissues within normal limits. Mild scattered mucosal thickening within the ethmoidal air cells. Paranasal sinuses are otherwise clear. Trace opacity within the mastoid air cells bilaterally. Inner ear structures within normal limits. MRA HEAD FINDINGS ANTERIOR CIRCULATION: Distal cervical segments of the internal carotid arteries are patent with antegrade flow. Petrous, cavernous, and supraclinoid segments patent without flow-limiting stenosis. A1 segments patent. Anterior communicating artery within normal limits. Anterior cerebral arteries well opacified to their distal aspects. M1 segments patent without stenosis or occlusion. MCA bifurcations within normal limits. Distal MCA branches well perfused and symmetric. POSTERIOR CIRCULATION: Right vertebral artery patent to the vertebrobasilar junction. Right posterior inferior cerebral artery patent proximally. Left vertebral artery is not visualize, likely occluded. There is apparent occlusion of the basilar artery at the level of the vertebrobasilar junction. There is some patchy distal reconstitution (series 4, image 75). Superior cerebral arteries are patent distally. P1 segments are hypoplastic bilaterally, particularly on the right, with prominent bilateral posterior communicating arteries. PCAs are well perfused and opacified to their distal aspects. No aneurysm or vascular malformation. IMPRESSION: MRI HEAD IMPRESSION: 1. 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 2. Mild chronic microvascular ischemic disease. MRA HEAD IMPRESSION: 1. Nonvisualization of the left vertebral artery and majority of the basilar  artery, likely occluded. Right vertebral artery patent to the  vertebrobasilar junction. PCAs are well perfused, predominantly fetal type in nature and supplied via prominent posterior communicating arteries. 2. Widely patent anterior circulation. Results were called by telephone at the time of interpretation on 06/24/2016 at 12:24 am to Dr. Nicole Kindred, who verbally acknowledged these results. Electronically Signed   By: Jeannine Boga M.D.   On: 06/24/2016 00:30   Ct Head Code Stroke W/o Cm  Result Date: 06/23/2016 CLINICAL DATA:  Code stroke. Left-sided weakness, slurred speech and facial droop EXAM: CT HEAD WITHOUT CONTRAST TECHNIQUE: Contiguous axial images were obtained from the base of the skull through the vertex without intravenous contrast. COMPARISON:  None. FINDINGS: Brain: There is no acute hemorrhage. No evidence of acute cortical infarct. No midline shift or mass effect. There is periventricular hypoattenuation compatible with chronic microvascular disease. No hydrocephalus. Vascular: No hyperdense vessel sign. Skull: Normal. Negative for fracture or focal lesion. Sinuses/Orbits: No fluid levels or advanced mucosal thickening. Normal orbits. Other: Visualized extracranial soft tissues are normal. ASPECTS ([place] Stroke Program Early CT Score) - Ganglionic level infarction (caudate, lentiform nuclei, internal capsule, insula, M1-M3 cortex): 7 - Supraganglionic infarction (M4-M6 cortex): 3 Total score (0-10 with 10 being normal): 10 IMPRESSION: 1. No acute intracranial abnormality. 2. ASPECTS is 10. These results were called by telephone at the time of interpretation on 06/23/2016 at 9:12 pm to [place], [place], who verbally acknowledged these results. Electronically Signed   By: Ulyses Jarred M.D.   On: 06/23/2016 21:12   Echocardiogram: Left ventricle: The cavity size was normal. Wall thickness was increased in a pattern of moderate LVH. Systolic function was normal. The estimated ejection fraction was in the range of 55% to 60%. Wall motion was  normal; there were no regional wall motion abnormalities. Doppler parameters are consistent with abnormal left ventricular relaxation (grade 1 diastolic dysfunction). Mitral valve: Moderately calcified annulus. Left atrium: The atrium was mildly dilated.   Subjective: Patient seen and evaluated.  She and her family are open to possibly going to inpatient rehab.  She feels uncomfortable with the thought of going home.  Would like to keep the foley catheter until she is strong enough to ambulate to and from commode.    Discharge Exam: Vitals:   06/26/16 1043 06/26/16 1347  BP: (!) 159/54 (!) 149/49  Pulse: (!) 105 (!) 102  Resp: 20 20  Temp: 97.6 F (36.4 C) 98 F (36.7 C)   Vitals:   06/26/16 0519 06/26/16 0822 06/26/16 1043 06/26/16 1347  BP: (!) 149/44 (!) 142/56 (!) 159/54 (!) 149/49  Pulse: 86 98 (!) 105 (!) 102  Resp: 18 18 20 20   Temp: 97.8 F (36.6 C) 98 F (36.7 C) 97.6 F (36.4 C) 98 F (36.7 C)  TempSrc: Oral Oral Oral Oral  SpO2: 98% 99% 98% 99%  Weight:      Height:        General: Pt is alert, awake, not in acute distress Cardiovascular: RRR, S1/S2 +, no rubs, no gallops Respiratory: CTA bilaterally, no wheezing, no rhonchi Abdominal: Soft, NT, ND, bowel sounds + Extremities: no edema, no cyanosis    The results of significant diagnostics from this hospitalization (including imaging, microbiology, ancillary and laboratory) are listed below for reference.     Microbiology: No results found for this or any previous visit (from the past 240 hour(s)).   Labs: BNP (last 3 results) No results for input(s): BNP in the last 8760 hours. Basic  Metabolic Panel:  Recent Labs Lab 06/23/16 2121 06/23/16 2134  NA 137 139  K 4.1 4.2  CL 106 108  CO2 21*  --   GLUCOSE 127* 124*  BUN 13 16  CREATININE 0.78 0.70  CALCIUM 9.7  --    Liver Function Tests:  Recent Labs Lab 06/23/16 2121  AST 22  ALT 26  ALKPHOS 73  BILITOT 0.4  PROT 7.1  ALBUMIN 4.0    No results for input(s): LIPASE, AMYLASE in the last 168 hours. No results for input(s): AMMONIA in the last 168 hours. CBC:  Recent Labs Lab 06/23/16 2121 06/23/16 2134  WBC 7.9  --   NEUTROABS 5.8  --   HGB 13.7 14.3  HCT 40.9 42.0  MCV 90.7  --   PLT 271  --    Cardiac Enzymes: No results for input(s): CKTOTAL, CKMB, CKMBINDEX, TROPONINI in the last 168 hours. BNP: Invalid input(s): POCBNP CBG:  Recent Labs Lab 06/24/16 1137 06/25/16 0605 06/26/16 0524  GLUCAP 118* 100* 99   D-Dimer No results for input(s): DDIMER in the last 72 hours. Hgb A1c  Recent Labs  06/24/16 0406  HGBA1C 5.4   Lipid Profile  Recent Labs  06/24/16 0406  CHOL 242*  HDL 82  LDLCALC 147*  TRIG 64  CHOLHDL 3.0   Thyroid function studies No results for input(s): TSH, T4TOTAL, T3FREE, THYROIDAB in the last 72 hours.  Invalid input(s): FREET3 Anemia work up No results for input(s): VITAMINB12, FOLATE, FERRITIN, TIBC, IRON, RETICCTPCT in the last 72 hours. Urinalysis    Component Value Date/Time   COLORURINE YELLOW 06/23/2016 0201   APPEARANCEUR CLEAR 06/23/2016 0201   LABSPEC 1.013 06/23/2016 0201   PHURINE 5.5 06/23/2016 0201   GLUCOSEU NEGATIVE 06/23/2016 0201   HGBUR NEGATIVE 06/23/2016 0201   BILIRUBINUR NEGATIVE 06/23/2016 0201   KETONESUR NEGATIVE 06/23/2016 0201   PROTEINUR NEGATIVE 06/23/2016 0201   NITRITE NEGATIVE 06/23/2016 0201   LEUKOCYTESUR SMALL (A) 06/23/2016 0201   Sepsis Labs Invalid input(s): PROCALCITONIN,  WBC,  LACTICIDVEN Microbiology No results found for this or any previous visit (from the past 240 hour(s)).   Time coordinating discharge: Less than 30 minutes  SIGNED:   Newman Pies, MD  [facility] 06/26/2016, 5:11 PM Pager [phone] If 7PM-7AM, please contact night-coverage [email or web address] Password [email or web address]

## 2016-06-25 NOTE — Progress Notes (Signed)
Patients blood sugar was 100 this morning.

## 2016-06-25 NOTE — Care Management Note (Signed)
Case Management Note  Patient Details  Name: Melanie Hendricks MRN: [id number] Date of Birth: 1936-05-26  Subjective/Objective:  Pt admitted with CVA. She is from home with family.               Action/Plan: PT/OT recommendations are for CIR. CM following for d/c disposition.  Expected Discharge Date:                  Expected Discharge Plan:  [place]  In-House Referral:     Discharge planning Services     Post Acute Care Choice:    Choice offered to:     DME Arranged:    DME Agency:     HH Arranged:    [place] Agency:     Status of Service:  In process, will continue to follow  If discussed at Long Length of Stay Meetings, dates discussed:    Additional Comments:  Pollie Friar, RN 06/25/2016, 10:32 AM

## 2016-06-25 NOTE — Consult Note (Signed)
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 right handed female with history of skin melanoma and recent left humerus fracture five weeks ago after fall with sling and NWB.   Marland Kitchen Presented 06/23/2016 with slurred speech and right-sided weakness with facial droop. Per chart review patient lives with children independent prior to admission.Family works during the day. One level home. 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 Patient did not receive TPA. Carotid Dopplers with no obvious ICA stenosis. Echocardiogram and EEG pending. Neurology consulted currently on aspirin and Plavix for CVA prophylaxis. Subcutaneous Lovenox for DVT prophylaxis. Tolerating a regular diet. Physical and occupational therapy evaluations completed 06/24/2016 with recommendations of physical medicine rehabilitation consult.   Review of Systems  Constitutional: Negative for chills and fever.  HENT: Negative for hearing loss.   Eyes: Negative for blurred vision and double vision.  Respiratory: Negative for cough and shortness of breath.   Cardiovascular: Negative for chest pain, palpitations and leg swelling.  Gastrointestinal: Positive for constipation. Negative for nausea and vomiting.  Genitourinary: Negative for dysuria and hematuria.  Musculoskeletal: Positive for myalgias.  Skin: Negative for rash.  Neurological: Positive for sensory change, speech change and weakness. Negative for headaches.  All other systems reviewed and are negative.  Past Medical History:  Diagnosis Date  . Fx humeral neck   . Skin melanoma ([place])    Past Surgical History:  Procedure Laterality Date  . TONSILLECTOMY     Family History    Problem Relation Age of Onset  . Stomach cancer Mother   . Thyroid cancer Father   . Heart attack Father   . Seizures Brother   . Heart failure Brother    Social History:  reports that she has never smoked. She has never used smokeless tobacco. She reports that she does not drink alcohol or use drugs. Allergies: No Known Allergies Medications Prior to Admission  Medication Sig Dispense Refill  . acetaminophen (TYLENOL) 325 MG tablet Take 325 mg by mouth every 6 (six) hours as needed for mild pain or headache.    Marland Kitchen aspirin EC 325 MG tablet Take 325 mg by mouth daily as needed for moderate pain.    Marland Kitchen HYDROcodone-acetaminophen (NORCO/VICODIN) 5-325 MG tablet Take 1 tablet by mouth every 4 (four) hours as needed. 15 tablet 0  . ondansetron (ZOFRAN ODT) 4 MG disintegrating tablet Take 1 tablet (4 mg total) by mouth every 8 (eight) hours as needed for nausea or vomiting. 20 tablet 0    Home: Home Living Family/patient expects to be discharged to:: Private residence Living Arrangements: Children Available Help at Discharge: Family, Available PRN/intermittently Type of Home: House Home Layout: One level Bathroom Shower/Tub: Chiropodist: Standard Home Equipment: None  Lives With: Son (dtr-in-law)  Functional History: Prior Function Level of Independence: Independent Functional Status:  Mobility: Bed Mobility Overal bed mobility: Needs Assistance, +2 for physical assistance Bed Mobility: Rolling, Sidelying to Sit Rolling: Max assist Sidelying to sit: Max assist, +2 for physical assistance Transfers Overall transfer level: Needs assistance Equipment used: 2 person hand held assist Transfers: Sit to/from Stand, Stand Pivot Transfers Sit to Stand: Max assist, +2 physical assistance Stand pivot transfers: Max assist, +2 physical assistance General transfer comment: Strong  posterior lean in standing. Ambulation/Gait General Gait Details: not able     ADL: ADL Overall ADL's : Needs assistance/impaired Eating/Feeding Details (indicate cue type and reason): Reports daughter-in law has been feeding today. Encouraged self feeding. Grooming: Moderate assistance, Sitting Upper Body Bathing: Moderate assistance, Sitting Lower Body Bathing: Maximal assistance, +2 for physical assistance, Sit to/from stand Upper Body Dressing : Moderate assistance, Sitting Upper Body Dressing Details (indicate cue type and reason): to don sling Lower Body Dressing: Maximal assistance, Sit to/from stand, +2 for physical assistance Toilet Transfer: Maximal assistance, +2 for physical assistance, Stand-pivot, BSC Toilet Transfer Details (indicate cue type and reason): Simulated by stand pivot from bed to chair Functional mobility during ADLs: Maximal assistance, +2 for physical assistance (for stand pivot only)  Cognition: Cognition Overall Cognitive Status: Within Functional Limits for tasks assessed Arousal/Alertness: Awake/alert Orientation Level: Oriented X4 Attention: Sustained Sustained Attention: Appears intact Memory: Appears intact Awareness: Appears intact Problem Solving: Appears intact Safety/Judgment: Appears intact Cognition Arousal/Alertness: Awake/alert Behavior During Therapy: WFL for tasks assessed/performed, Flat affect Overall Cognitive Status: Within Functional Limits for tasks assessed  Blood pressure (!) 160/64, pulse 98, temperature 98.6 F (37 C), temperature source Oral, resp. rate 18, height 5\' 6"  (1.676 m), weight 109 kg (240 lb 6.4 oz), SpO2 98 %. Physical Exam  Constitutional: She is oriented to person, place, and time. She appears well-developed.  Eyes: EOM are normal.  Neck: Normal range of motion. Neck supple. Thyromegaly present.  Cardiovascular: Normal rate and regular rhythm.   Respiratory: Effort normal and breath sounds normal. No respiratory distress.  GI: Soft. Bowel sounds are normal. She exhibits no  distension.  Musculoskeletal:  LUE in sling  Neurological: She is alert and oriented to person, place, and time.  Follows commands .Good awareness of deficits. No dysarthria or CN findings. Mild right hemiparesis 4/5 RUE and 3 HF, 3+KE and 4+ ADF/PF with decreased [place] upper and lower extremity. LUE limited by ortho/sling. LLE 5/5 prox to distal. Cognitively intact  Skin: Skin is warm.  Psychiatric: She has a normal mood and affect. Her behavior is normal. Judgment and thought content normal.    Results for orders placed or performed during the hospital encounter of 06/23/16 (from the past 24 hour(s))  Glucose, capillary     Status: Abnormal   Collection Time: 06/24/16 11:37 AM  Result Value Ref Range   Glucose-Capillary 118 (H) 65 - 99 mg/dL  Glucose, capillary     Status: Abnormal   Collection Time: 06/25/16  6:05 AM  Result Value Ref Range   Glucose-Capillary 100 (H) 65 - 99 mg/dL   Comment 1 Notify RN    Comment 2 Document in Chart    Mr Brain Wo Contrast  Result Date: 06/24/2016 CLINICAL DATA:  Initial evaluation for slurred speech. Left-sided numbness. EXAM: MRI HEAD WITHOUT CONTRAST MRA HEAD WITHOUT CONTRAST TECHNIQUE: Multiplanar, multiecho pulse sequences of the brain and surrounding structures were obtained without intravenous contrast. Angiographic images of the head were obtained using MRA technique without contrast. COMPARISON:  Prior CT from earlier the same day. FINDINGS: MRI HEAD FINDINGS Brain: Cerebral volume within normal limits for age. Patchy and confluent T2/FLAIR hyperintensity within the periventricular and deep white matter both cerebral hemispheres with, most consistent with chronic microvascular ischemic disease. This is fairly mild for age. There is patchy diffusion abnormality within the left paramedian pons, compatible with acute ischemic infarct (series 5, image 16). No associated mass effect or hemorrhage. No other acute infarct identified. Gray-white  matter  differentiation otherwise maintained. No evidence for acute or chronic intracranial hemorrhage. The no other areas of chronic infarction identified. No mass lesion, midline shift, or mass effect. No hydrocephalus. No extra-axial fluid collection. Major dural sinuses are grossly patent. Vascular: Abnormal flow void within the left vertebral artery and basilar artery, which may be occluded. Major intracranial vascular flow voids otherwise maintained. Skull and upper cervical spine: Craniocervical junction within normal limits. Visualized upper cervical spine unremarkable. Osseous mineralization normal. No acute scalp soft tissue abnormality. Sinuses/Orbits: Globes and orbital soft tissues within normal limits. Mild scattered mucosal thickening within the ethmoidal air cells. Paranasal sinuses are otherwise clear. Trace opacity within the mastoid air cells bilaterally. Inner ear structures within normal limits. MRA HEAD FINDINGS ANTERIOR CIRCULATION: Distal cervical segments of the internal carotid arteries are patent with antegrade flow. Petrous, cavernous, and supraclinoid segments patent without flow-limiting stenosis. A1 segments patent. Anterior communicating artery within normal limits. Anterior cerebral arteries well opacified to their distal aspects. M1 segments patent without stenosis or occlusion. MCA bifurcations within normal limits. Distal MCA branches well perfused and symmetric. POSTERIOR CIRCULATION: Right vertebral artery patent to the vertebrobasilar junction. Right posterior inferior cerebral artery patent proximally. Left vertebral artery is not visualize, likely occluded. There is apparent occlusion of the basilar artery at the level of the vertebrobasilar junction. There is some patchy distal reconstitution (series 4, image 75). Superior cerebral arteries are patent distally. P1 segments are hypoplastic bilaterally, particularly on the right, with prominent bilateral posterior communicating  arteries. PCAs are well perfused and opacified to their distal aspects. No aneurysm or vascular malformation. IMPRESSION: MRI HEAD IMPRESSION: 1. 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 2. Mild chronic microvascular ischemic disease. MRA HEAD IMPRESSION: 1. Nonvisualization of the left vertebral artery and majority of the basilar artery, likely occluded. Right vertebral artery patent to the vertebrobasilar junction. PCAs are well perfused, predominantly fetal type in nature and supplied via prominent posterior communicating arteries. 2. Widely patent anterior circulation. Results were called by telephone at the time of interpretation on 06/24/2016 at 12:24 am to Dr. Nicole Kindred, who verbally acknowledged these results. Electronically Signed   By: Jeannine Boga M.D.   On: 06/24/2016 00:30   Mr Jodene Nam Head/brain [id number] Cm  Result Date: 06/24/2016 CLINICAL DATA:  Initial evaluation for slurred speech. Left-sided numbness. EXAM: MRI HEAD WITHOUT CONTRAST MRA HEAD WITHOUT CONTRAST TECHNIQUE: Multiplanar, multiecho pulse sequences of the brain and surrounding structures were obtained without intravenous contrast. Angiographic images of the head were obtained using MRA technique without contrast. COMPARISON:  Prior CT from earlier the same day. FINDINGS: MRI HEAD FINDINGS Brain: Cerebral volume within normal limits for age. Patchy and confluent T2/FLAIR hyperintensity within the periventricular and deep white matter both cerebral hemispheres with, most consistent with chronic microvascular ischemic disease. This is fairly mild for age. There is patchy diffusion abnormality within the left paramedian pons, compatible with acute ischemic infarct (series 5, image 16). No associated mass effect or hemorrhage. No other acute infarct identified. Gray-white matter differentiation otherwise maintained. No evidence for acute or chronic intracranial hemorrhage. The no other areas of chronic infarction  identified. No mass lesion, midline shift, or mass effect. No hydrocephalus. No extra-axial fluid collection. Major dural sinuses are grossly patent. Vascular: Abnormal flow void within the left vertebral artery and basilar artery, which may be occluded. Major intracranial vascular flow voids otherwise maintained. Skull and upper cervical spine: Craniocervical junction within normal limits. Visualized upper cervical spine unremarkable. Osseous mineralization normal. No  acute scalp soft tissue abnormality. Sinuses/Orbits: Globes and orbital soft tissues within normal limits. Mild scattered mucosal thickening within the ethmoidal air cells. Paranasal sinuses are otherwise clear. Trace opacity within the mastoid air cells bilaterally. Inner ear structures within normal limits. MRA HEAD FINDINGS ANTERIOR CIRCULATION: Distal cervical segments of the internal carotid arteries are patent with antegrade flow. Petrous, cavernous, and supraclinoid segments patent without flow-limiting stenosis. A1 segments patent. Anterior communicating artery within normal limits. Anterior cerebral arteries well opacified to their distal aspects. M1 segments patent without stenosis or occlusion. MCA bifurcations within normal limits. Distal MCA branches well perfused and symmetric. POSTERIOR CIRCULATION: Right vertebral artery patent to the vertebrobasilar junction. Right posterior inferior cerebral artery patent proximally. Left vertebral artery is not visualize, likely occluded. There is apparent occlusion of the basilar artery at the level of the vertebrobasilar junction. There is some patchy distal reconstitution (series 4, image 75). Superior cerebral arteries are patent distally. P1 segments are hypoplastic bilaterally, particularly on the right, with prominent bilateral posterior communicating arteries. PCAs are well perfused and opacified to their distal aspects. No aneurysm or vascular malformation. IMPRESSION: MRI HEAD IMPRESSION:  1. 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 2. Mild chronic microvascular ischemic disease. MRA HEAD IMPRESSION: 1. Nonvisualization of the left vertebral artery and majority of the basilar artery, likely occluded. Right vertebral artery patent to the vertebrobasilar junction. PCAs are well perfused, predominantly fetal type in nature and supplied via prominent posterior communicating arteries. 2. Widely patent anterior circulation. Results were called by telephone at the time of interpretation on 06/24/2016 at 12:24 am to Dr. Nicole Kindred, who verbally acknowledged these results. Electronically Signed   By: Jeannine Boga M.D.   On: 06/24/2016 00:30   Ct Head Code Stroke W/o Cm  Result Date: 06/23/2016 CLINICAL DATA:  Code stroke. Left-sided weakness, slurred speech and facial droop EXAM: CT HEAD WITHOUT CONTRAST TECHNIQUE: Contiguous axial images were obtained from the base of the skull through the vertex without intravenous contrast. COMPARISON:  None. FINDINGS: Brain: There is no acute hemorrhage. No evidence of acute cortical infarct. No midline shift or mass effect. There is periventricular hypoattenuation compatible with chronic microvascular disease. No hydrocephalus. Vascular: No hyperdense vessel sign. Skull: Normal. Negative for fracture or focal lesion. Sinuses/Orbits: No fluid levels or advanced mucosal thickening. Normal orbits. Other: Visualized extracranial soft tissues are normal. ASPECTS ([place] Stroke Program Early CT Score) - Ganglionic level infarction (caudate, lentiform nuclei, internal capsule, insula, M1-M3 cortex): 7 - Supraganglionic infarction (M4-M6 cortex): 3 Total score (0-10 with 10 being normal): 10 IMPRESSION: 1. No acute intracranial abnormality. 2. ASPECTS is 10. These results were called by telephone at the time of interpretation on 06/23/2016 at 9:12 pm to [place], [place], who verbally acknowledged these results. Electronically Signed   By: Ulyses Jarred M.D.   On: 06/23/2016 21:12    Assessment/Plan: Diagnosis: left pontine infarct, recent left humeral fracture 1. Does the need for close, 24 hr/day medical supervision in concert with the patient's rehab needs make it unreasonable for this patient to be served in a less intensive setting? Yes 2. Co-Morbidities requiring supervision/potential complications: HTN 3. Due to bladder management, bowel management, safety, skin/wound care, disease management, medication administration, pain management and patient education, does the patient require 24 hr/day rehab nursing? Yes 4. Does the patient require coordinated care of a physician, rehab nurse, PT (1-2 hrs/day, 5 days/week) and OT (1-2 hrs/day, 5 days/week) to address physical and functional deficits in the context of  the above medical diagnosis(es)? Yes Addressing deficits in the following areas: balance, endurance, locomotion, strength, transferring, bowel/bladder control, bathing, dressing, feeding, grooming, toileting and psychosocial support 5. Can the patient actively participate in an intensive therapy program of at least 3 hrs of therapy per day at least 5 days per week? Yes 6. The potential for patient to make measurable gains while on inpatient rehab is excellent 7. Anticipated functional outcomes upon discharge from inpatient rehab are modified independent  with PT, modified independent and supervision with OT, n/a with SLP. 8. Estimated rehab length of stay to reach the above functional goals is: 9-14 days 9. Does the patient have adequate social supports and living environment to accommodate these discharge functional goals? Yes 10. Anticipated D/C setting: Home 11. Anticipated post D/C treatments: HH therapy and Outpatient therapy 12. Overall Rehab/Functional Prognosis: excellent  RECOMMENDATIONS: This patient's condition is appropriate for continued rehabilitative care in the following setting: CIR Patient has agreed to  participate in recommended program. Yes Note that insurance prior authorization may be required for reimbursement for recommended care.  Comment: Rehab Admissions Coordinator to follow up.  Thanks,  Meredith Staggers, MD, Mellody Drown     06/25/2016

## 2016-06-25 NOTE — Progress Notes (Signed)
Physical Therapy Treatment Patient Details Name: LAGENA HABA MRN: [id number] DOB: 07/15/1936 Today's Date: 06/25/2016    History of Present Illness 79 y.o. female with medical history significant of skin melanoma, who presents with slurred speech, deviation of eyes to the right side, right facial droop, right-sided numbness. Patient fractured her left humerus 5 weeks ago and has kept her left upper extremity in a sling. MRI on 10/17 + for acute L pontine infarct.     PT Comments    Steady improvements with bed mobility, scooting to EOB, general balance in sitting and standing/pre-gait.  Great rehab candidate.   Follow Up Recommendations  CIR     Equipment Recommendations  Other (comment) (TBA)    Recommendations for Other Services Rehab consult     Precautions / Restrictions Precautions Precautions: Fall Restrictions Weight Bearing Restrictions: No LUE Weight Bearing: Non weight bearing    Mobility  Bed Mobility Overal bed mobility: Needs Assistance;+2 for physical assistance Bed Mobility: Rolling;Sidelying to Sit Rolling: Max assist Sidelying to sit: Max assist;+2 for safety/equipment;+2 for physical assistance       General bed mobility comments: cues for technique, assist to come up via R elbow  Transfers Overall transfer level: Needs assistance Equipment used: 1 person hand held assist Transfers: Sit to/from Stand;Stand Pivot Transfers Sit to Stand: Mod assist;+2 safety/equipment Stand pivot transfers: Max assist;+2 physical assistance       General transfer comment: cues for transfer preparation, hand placement.  Assist to help her come forward and stability assist  Ambulation/Gait             General Gait Details: W/shifting and stepping forward and back.   Stairs            Wheelchair Mobility    Modified Rankin (Stroke Patients Only) Modified Rankin (Stroke Patients Only) Pre-Morbid Rankin Score: No symptoms Modified Rankin:  Severe disability     Balance     Sitting balance-Leahy Scale: Fair Sitting balance - Comments: improved without assist and pt able to maintain midline orientation.  Scooted to EOB with guard assist      Standing balance-Leahy Scale: Poor Standing balance comment: worked in standing at EOB on w/shift, midline balance, R knee control, stepping with R and L LE's  2 person assist, but for safety not as much physical assist                    Cognition Arousal/Alertness: Awake/alert Behavior During Therapy: WFL for tasks assessed/performed;Flat affect Overall Cognitive Status: Within Functional Limits for tasks assessed                      Exercises      General Comments        Pertinent Vitals/Pain Pain Assessment: No/denies pain    Home Living                      Prior Function            PT Goals (current goals can now be found in the care plan section) Acute Rehab PT Goals PT Goal Formulation: With patient Time For Goal Achievement: 07/08/16 Potential to Achieve Goals: Good Progress towards PT goals: Progressing toward goals    Frequency    Min 3X/week      PT Plan Current plan remains appropriate    Co-evaluation             End of Session  Activity Tolerance: Patient tolerated treatment well Patient left: in chair;with call bell/phone within reach;with chair alarm set     Time: 1410-1441 PT Time Calculation (min) (ACUTE ONLY): 31 min  Charges:  $Therapeutic Activity: 23-37 mins                    G Codes:      Tarryn Bogdan, Tessie Fass 06/25/2016, 3:16 PM 06/25/2016  Donnella Sham, PT [phone]  (pager)

## 2016-06-25 NOTE — H&P (Signed)
Physical Medicine and Rehabilitation Admission H&P    Chief Complaint  Patient presents with  . Aphasia  . Facial Droop  : HPI: Melanie Hendricks is a 80 y.o. right handed female with history of skin melanoma and recent left humerus fracture five weeks ago after fall with sling and NWB.   Marland Kitchen Presented 06/23/2016 with slurred speech and right-sided weakness with facial droop. Per chart review patient lives with children independent prior to admission.Family works during the day. One level home. 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 Patient did not receive TPA. Carotid Dopplers with no obvious ICA stenosis. Echocardiogram With ejection fraction of 60% and grade 1 diastolic dysfunction. EEG was no seizure activity. Neurology consulted currently on aspirin and Plavix for CVA prophylaxis. Subcutaneous Lovenox for DVT prophylaxis. Tolerating a regular diet. Physical and occupational therapy evaluations completed 06/24/2016 with recommendations of physical medicine rehabilitation consult.Patient was admitted for comprehensive rehabilitation program  ROS Constitutional: Negative for chills and fever.  HENT: Negative for hearing loss.   Eyes: Negative for blurred vision and double vision.  Respiratory: Negative for cough and shortness of breath.   Cardiovascular: Negative for chest pain, palpitations and leg swelling.  Gastrointestinal: Positive for constipation. Negative for nausea and vomiting.  Genitourinary: Negative for dysuria and hematuria.  Musculoskeletal: Positive for myalgias.  Skin: Negative for rash.  Neurological: Positive for sensory change, speech change and weakness. Negative for headaches.  All other systems reviewed and are negative   Past Medical History:  Diagnosis Date  . Fx humeral neck   . Skin melanoma ([place])    Past  Surgical History:  Procedure Laterality Date  . TONSILLECTOMY     Family History  Problem Relation Age of Onset  . Stomach cancer Mother   . Thyroid cancer Father   . Heart attack Father   . Seizures Brother   . Heart failure Brother    Social History:  reports that she has never smoked. She has never used smokeless tobacco. She reports that she does not drink alcohol or use drugs. Allergies: No Known Allergies Medications Prior to Admission  Medication Sig Dispense Refill  . acetaminophen (TYLENOL) 325 MG tablet Take 325 mg by mouth every 6 (six) hours as needed for mild pain or headache.    Marland Kitchen aspirin EC 325 MG tablet Take 325 mg by mouth daily as needed for moderate pain.    Marland Kitchen HYDROcodone-acetaminophen (NORCO/VICODIN) 5-325 MG tablet Take 1 tablet by mouth every 4 (four) hours as needed. 15 tablet 0  . ondansetron (ZOFRAN ODT) 4 MG disintegrating tablet Take 1 tablet (4 mg total) by mouth every 8 (eight) hours as needed for nausea or vomiting. 20 tablet 0    Home: Home Living Family/patient expects to be discharged to:: Private residence Living Arrangements: Children Available Help at Discharge: Family, Available PRN/intermittently Type of Home: House Home Access: Stairs to enter [employer] of Steps: 2 small steps Home Layout: One level Bathroom Shower/Tub: Chiropodist: Standard Home Equipment: None  Lives With: Son (dtr-in-law)   Functional History: Prior Function Level of Independence: Independent  Functional Status:  Mobility: Bed Mobility Overal bed mobility: Needs Assistance Bed Mobility: Supine to Sit Rolling: Max assist Sidelying to sit: Max assist, +2 for safety/equipment, +2 for physical assistance Supine to sit: Mod assist General bed mobility comments: Pt up in chair upon  arrival Transfers Overall transfer level: Needs assistance Equipment used: 1 person hand held assist Transfers: Sit to/from Stand Sit to Stand: +2  safety/equipment, Min assist Stand pivot transfers: Min guard, +2 physical assistance General transfer comment: cues for hand placement and scooting forward; assist to power up into standing and to steady upon stand Ambulation/Gait Ambulation/Gait assistance: Max assist, +2 physical assistance Ambulation Distance (Feet): 10 Feet Assistive device: 2 person hand held assist Gait Pattern/deviations: Step-to pattern, Decreased stance time - left, Decreased step length - right, Decreased step length - left, Decreased weight shift to right General Gait Details: cues for sequencing, posture, and weight shifting to midline; assist to weight shift, maintain balance, and advance L LE at times; pt fatigued and required mod +2 to return to sitting and recliner brought up to pt's legs by daughter    ADL: ADL Overall ADL's : Needs assistance/impaired Eating/Feeding Details (indicate cue type and reason): Reports daughter-in law has been feeding today. Encouraged self feeding. Grooming: Moderate assistance, Sitting Upper Body Bathing: Moderate assistance, Sitting Lower Body Bathing: Maximal assistance, +2 for physical assistance, Sit to/from stand Upper Body Dressing : Moderate assistance, Sitting Upper Body Dressing Details (indicate cue type and reason): to don sling Lower Body Dressing: Maximal assistance, Sit to/from stand, +2 for physical assistance Toilet Transfer: Minimal assistance, +2 for physical assistance, Cueing for safety, Cueing for sequencing, Stand-pivot Toilet Transfer Details (indicate cue type and reason): simulated with eob to recliner transfer.  dtr. in law will be main caregiver so she assisted with transfer/family education.   Toileting- Clothing Manipulation and Hygiene: Maximal assistance, Sit to/from stand Toileting - Clothing Manipulation Details (indicate cue type and reason): simulated during transfer in room Functional mobility during ADLs: Minimal assistance, +2 for physical  assistance General ADL Comments: dtr. and dtr. in law very engaged and active in assisting pt.    Cognition: Cognition Overall Cognitive Status: Within Functional Limits for tasks assessed Arousal/Alertness: Awake/alert Orientation Level: Oriented X4 Attention: Sustained Sustained Attention: Appears intact Memory: Appears intact Awareness: Appears intact Problem Solving: Appears intact Safety/Judgment: Appears intact Cognition Arousal/Alertness: Awake/alert Behavior During Therapy: WFL for tasks assessed/performed, Flat affect Overall Cognitive Status: Within Functional Limits for tasks assessed  Physical Exam: Blood pressure (!) 162/62, pulse (!) 105, temperature 98 F (36.7 C), temperature source Oral, resp. rate 20, height 5\' 6"  (1.676 m), weight 109 kg (240 lb 6.4 oz), SpO2 100 %. Physical Exam Constitutional: She is oriented to person, place, and time. She appears well-developed.  Eyes: EOM are normal.  Neck: Normal range of motion. Neck supple. Thyromegaly present.  Cardiovascular: Normal rate and regular rhythm.   Respiratory: Effort normal and breath sounds normal. No respiratory distress.  GI: Soft. Bowel sounds are normal. She exhibits no distension.  Musculoskeletal:  LUE in sling  Neurological: She is alert and oriented to person, place, and time.  Follows commands .Good awareness of deficits. No dysarthria or CN findings. Motor: 4/5 right biceps, triceps, wrist and HI and 3/5 right HF, 3+KE and 4+ ADF/PF with decreased [place] upper and lower extremity. LUE limited by ortho/sling. LLE 5/5 prox to distal. Cognitively intact  Skin: Skin is warm.  Psychiatric: She has a normal mood and affect. Her behavior is normal. Judgment and thought content normal   Results for orders placed or performed during the hospital encounter of 06/23/16 (from the past 48 hour(s))  Glucose, capillary     Status: Abnormal   Collection Time: 06/25/16  6:05 AM  Result Value Ref  Range    Glucose-Capillary 100 (H) 65 - 99 mg/dL   Comment 1 Notify RN    Comment 2 Document in Chart   Glucose, capillary     Status: None   Collection Time: 06/26/16  5:24 AM  Result Value Ref Range   Glucose-Capillary 99 65 - 99 mg/dL   Comment 1 Notify RN    Comment 2 Document in Chart    No results found.     Medical Problem List and Plan: 1.  Right hemiplegia secondary to left pontine infarct  -admit to inpatient rehab 2.  DVT Prophylaxis/Anticoagulation: Subcutaneous Lovenox. Monitor platelet counts of any signs of bleeding 3. Pain Management: Hydrocodone as needed 4. Left humerus fracture 5 weeks ago after a fall. Shoulder sling, nonweightbearing. 5. Neuropsych: This patient is capable of making decisions on her own behalf. 6. Skin/Wound Care: Routine skin checks, pressure relief as appropriate 7. Fluids/Electrolytes/Nutrition: Routine I&O with follow-up chemistries 8. Hyperlipidemia. Lipitor 9. Left humerus fracture 5 weeks ago after a fall. Nonweightbearing. Continue sling  -LUE elevation/compression to help control edema    Post Admission Physician Evaluation: 1. Functional deficits secondary  to left pontine infarct, recent left humerus fx. 2. Patient is admitted to receive collaborative, interdisciplinary care between the physiatrist, rehab nursing staff, and therapy team. 3. Patient's level of medical complexity and substantial therapy needs in context of that medical necessity cannot be provided at a lesser intensity of care such as a SNF. 4. Patient has experienced substantial functional loss from his/her baseline which was documented above under the "Functional History" and "Functional Status" headings.  Judging by the patient's diagnosis, physical exam, and functional history, the patient has potential for functional progress which will result in measurable gains while on inpatient rehab.  These gains will be of substantial and practical use upon discharge  in facilitating  mobility and self-care at the household level. 5. Physiatrist will provide 24 hour management of medical needs as well as oversight of the therapy plan/treatment and provide guidance as appropriate regarding the interaction of the two. 6. 24 hour rehab nursing will assist with bladder management, bowel management, safety, skin/wound care, disease management, medication administration, pain management and patient education  and help integrate therapy concepts, techniques,education, etc. 7. PT will assess and treat for/with: Lower extremity strength, range of motion, stamina, balance, functional mobility, safety, adaptive techniques and equipment, NMR, pain mgt, ortho precautions.   Goals are: mod I to supervision. 8. OT will assess and treat for/with: ADL's, functional mobility, safety, upper extremity strength, adaptive techniques and equipment, NMR, ortho precautions, family education, pain mgt.   Goals are: mod I to min assist. Therapy may proceed with showering this patient. 9. SLP will assess and treat for/with: n/a.  Goals are: n/a. 10. Case Management and Social Worker will assess and treat for psychological issues and discharge planning. 11. Team conference will be held weekly to assess progress toward goals and to determine barriers to discharge. 12. Patient will receive at least 3 hours of therapy per day at least 5 days per week. 13. ELOS: 10-14 days       14. Prognosis:  excellent     Meredith Staggers, MD, [facility] 06/26/2016  06/26/2016

## 2016-06-25 NOTE — Progress Notes (Signed)
Inpatient Rehabilitation  Per PT request, patient was screened by Agam Davenport for appropriateness for an Inpatient Acute Rehab consult.  At this time we are recommending an Inpatient Rehab consult.  Please order if you are agreeable.    Juwann Sherk, M.A., CCC/SLP Admission Coordinator  [facility]  Cell [phone]  

## 2016-06-25 NOTE — Progress Notes (Signed)
STROKE TEAM PROGRESS NOTE   SUBJECTIVE (INTERVAL HISTORY) Her son Melanie Hendricks and additional family members are at the bedside. Eye drooping has improved. Therapy recommends rehab, and while she is agreeable, she does want to go home. Family asking about stroke etiology.Patient was randomized to standard of care for the stroke atrial fibrillation trial.   OBJECTIVE Temp:  [97.5 F (36.4 C)-99 F (37.2 C)] 98.6 F (37 C) (10/19 0536) Pulse Rate:  [90-99] 98 (10/19 0536) Cardiac Rhythm: Sinus tachycardia (10/18 1900) Resp:  [10-20] 18 (10/19 0536) BP: (149-163)/(53-64) 160/64 (10/19 0536) SpO2:  [97 %-99 %] 98 % (10/19 0536)  CBC:   Recent Labs Lab 06/23/16 2121 06/23/16 2134  WBC 7.9  --   NEUTROABS 5.8  --   HGB 13.7 14.3  HCT 40.9 42.0  MCV 90.7  --   PLT 271  --     Basic Metabolic Panel:   Recent Labs Lab 06/23/16 2121 06/23/16 2134  NA 137 139  K 4.1 4.2  CL 106 108  CO2 21*  --   GLUCOSE 127* 124*  BUN 13 16  CREATININE 0.78 0.70  CALCIUM 9.7  --     Lipid Panel:     Component Value Date/Time   CHOL 242 (H) 06/24/2016 0406   TRIG 64 06/24/2016 0406   HDL 82 06/24/2016 0406   CHOLHDL 3.0 06/24/2016 0406   VLDL 13 06/24/2016 0406   LDLCALC 147 (H) 06/24/2016 0406   HgbA1c: No results found for: HGBA1C Urine Drug Screen:     Component Value Date/Time   LABOPIA POSITIVE (A) 06/23/2016 0157   COCAINSCRNUR NONE DETECTED 06/23/2016 0157   LABBENZ NONE DETECTED 06/23/2016 0157   AMPHETMU NONE DETECTED 06/23/2016 0157   THCU NONE DETECTED 06/23/2016 0157   LABBARB NONE DETECTED 06/23/2016 0157      IMAGING  MRI HEAD  06/24/2016 1. 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 2. Mild chronic microvascular ischemic disease.   MRA HEAD  06/24/2016 1. Nonvisualization of the left vertebral artery and majority of the basilar artery, likely occluded. Right vertebral artery patent to the vertebrobasilar junction. PCAs are well perfused,  predominantly fetal type in nature and supplied via prominent posterior communicating arteries. 2. Widely patent anterior circulation.   Ct Head Code Stroke W/o Cm 06/23/2016 1. No acute intracranial abnormality. 2. ASPECTS is 10.   Carotid Doppler   There is no obvious evidence of hemodynamically significant internal carotid artery stenosis bilaterally. Vertebral arteries are patent with antegrade flow.  2D Echocardiogram  pending     PHYSICAL EXAM Pleasant elderly Caucasian lady not in distress. . Afebrile. Head is nontraumatic. Neck is supple without bruit.    Cardiac exam no murmur or gallop. Lungs are clear to auscultation. Distal pulses are well felt.   . Afebrile. Head is nontraumatic. Neck is supple without bruit.    Cardiac exam no murmur or gallop. Lungs are clear to auscultation. Distal pulses are well felt.Left upper extremity is in a sling due to recent fracture Neurological Exam ;  Awake  Alert oriented x 3. Normal speech and language.eye movements full without nystagmus.fundi were not visualized. Vision acuity and fields appear normal. Hearing is normal. Palatal movements are normal. Face symmetric. Tongue midline. Normal strength, tone, reflexes and coordination except left upper extremity testing limited due to fracture and being in a sling. Normal sensation. Gait deferred.  ASSESSMENT/PLAN [name] is a 80 y.o. female with no significant medical history presenting with slurred speech,  numbness and tingling and eye deviation to the R. She did not receive IV t-PA due to delay in arrival.   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 Yes; Fluid consistency: Thin  aspirin  325 mg daily prior to admission, now on aspirin 325 mg daily. Given large vessel intracranial atherosclerosis, patient should be treated with aspirin 81 mg and clopidogrel 75 mg orally every day x 3 months for secondary stroke prevention. After 3 months, change to plavix alone. Long-term dual antiplatelets are contraindicated due to risk for intracerebral hemorrhage.   Patient counseled to be compliant with her antithrombotic medications  Ongoing aggressive stroke risk factor management  Patient enrolled in Stroke AF Resesarch Trial.  Therapy recommendations:  CIR. Consult request in place  Disposition:  pending   Follow up Dr. Leonie Man in 6 weeks. Order placed.  Elevated BP  No hx HTN  Stable from stroke standpoint, but elevated 140-160s  Permissive hypertension (OK if < 220/120) but gradually normalize in 5-7 days Long-term BP goal normotensive  Hyperlipidemia  Home meds:  No statin  LDL 146, goal < 70  Added statin  Continue statin at discharge  Other Stroke Risk Factors  Advanced age  Obesity, Body mass index is 38.8 kg/m., recommend weight loss, diet and exercise as appropriate   Other Active Problems  Humeral neck fx  Hospital day # [place] for Pager information 06/25/2016 9:03 AM  I have personally examined this patient, reviewed notes, independently viewed imaging studies, participated in medical decision making and plan of care.ROS completed by me personally and pertinent positives fully documented  I have made any additions or clarifications directly to the above note. Agree with note above.  Continue aspirin and Plavix for 3 months followed by aspirin alone. Greater than 50% time during this 25 minute visit was spent on counseling and coordination of care about stroke risk, prevention and treatment Stroke team will sign off. Follow-up as an outpatient in stroke clinic and in research clinic for the stroke atrial  fibrillation trial. Antony Contras, MD Medical Director [place] Pager: [phone] 06/25/2016 2:24 PM   To contact Stroke Continuity provider, please refer to [email or web address] After hours, contact General Neurology

## 2016-06-25 NOTE — Progress Notes (Signed)
Rehab admissions - I met with patient, son, daughter and dtr-in-law today.  All are in favor of inpatient rehab admission.  I gave them rehab booklets and explained inpatient rehab.  I will open the case with [facility] medicare and request acute inpatient rehab admission.  I will follow up tomorrow.  Call me for questions.  #[phone]

## 2016-06-25 NOTE — Progress Notes (Signed)
  Echocardiogram 2D Echocardiogram has been performed.  Diamond Nickel 06/25/2016, 2:44 PM

## 2016-06-26 ENCOUNTER — Encounter (HOSPITAL_COMMUNITY): Payer: Self-pay | Admitting: *Deleted

## 2016-06-26 ENCOUNTER — Inpatient Hospital Stay (HOSPITAL_COMMUNITY)
Admission: RE | Admit: 2016-06-26 | Discharge: 2016-07-10 | DRG: 092 | Disposition: A | Discharge status: Home / Self Care | Payer: Medicare Other | Source: Intra-hospital | Attending: Physical Medicine & Rehabilitation | Admitting: Physical Medicine & Rehabilitation

## 2016-06-26 DIAGNOSIS — Z7982 Long term (current) use of aspirin: Secondary | ICD-10-CM

## 2016-06-26 DIAGNOSIS — M25539 Pain in unspecified wrist: Secondary | ICD-10-CM

## 2016-06-26 DIAGNOSIS — M25532 Pain in left wrist: Secondary | ICD-10-CM

## 2016-06-26 DIAGNOSIS — M11232 Other chondrocalcinosis, left wrist: Secondary | ICD-10-CM | POA: Diagnosis present

## 2016-06-26 DIAGNOSIS — E785 Hyperlipidemia, unspecified: Secondary | ICD-10-CM | POA: Diagnosis present

## 2016-06-26 DIAGNOSIS — I69351 Hemiplegia and hemiparesis following cerebral infarction affecting right dominant side: Secondary | ICD-10-CM

## 2016-06-26 DIAGNOSIS — R2689 Other abnormalities of gait and mobility: Principal | ICD-10-CM | POA: Diagnosis present

## 2016-06-26 DIAGNOSIS — S42202S Unspecified fracture of upper end of left humerus, sequela: Secondary | ICD-10-CM | POA: Diagnosis not present

## 2016-06-26 DIAGNOSIS — I639 Cerebral infarction, unspecified: Secondary | ICD-10-CM | POA: Diagnosis present

## 2016-06-26 DIAGNOSIS — I10 Essential (primary) hypertension: Secondary | ICD-10-CM

## 2016-06-26 DIAGNOSIS — Z79899 Other long term (current) drug therapy: Secondary | ICD-10-CM | POA: Diagnosis not present

## 2016-06-26 DIAGNOSIS — Z85820 Personal history of malignant melanoma of skin: Secondary | ICD-10-CM | POA: Diagnosis not present

## 2016-06-26 DIAGNOSIS — W19XXXD Unspecified fall, subsequent encounter: Secondary | ICD-10-CM | POA: Diagnosis present

## 2016-06-26 DIAGNOSIS — R339 Retention of urine, unspecified: Secondary | ICD-10-CM | POA: Diagnosis present

## 2016-06-26 DIAGNOSIS — S42392D Other fracture of shaft of left humerus, subsequent encounter for fracture with routine healing: Secondary | ICD-10-CM | POA: Diagnosis not present

## 2016-06-26 DIAGNOSIS — G8918 Other acute postprocedural pain: Secondary | ICD-10-CM | POA: Diagnosis not present

## 2016-06-26 DIAGNOSIS — S42202A Unspecified fracture of upper end of left humerus, initial encounter for closed fracture: Secondary | ICD-10-CM

## 2016-06-26 DIAGNOSIS — I6350 Cerebral infarction due to unspecified occlusion or stenosis of unspecified cerebral artery: Secondary | ICD-10-CM | POA: Diagnosis not present

## 2016-06-26 DIAGNOSIS — S42295S Other nondisplaced fracture of upper end of left humerus, sequela: Secondary | ICD-10-CM | POA: Diagnosis not present

## 2016-06-26 LAB — CBC
HCT: 39.7 % (ref 36.0–46.0)
HEMOGLOBIN: 13.1 g/dL (ref 12.0–15.0)
MCH: 30.4 pg (ref 26.0–34.0)
MCHC: 33 g/dL (ref 30.0–36.0)
MCV: 92.1 fL (ref 78.0–100.0)
Platelets: 265 10*3/uL (ref 150–400)
RBC: 4.31 MIL/uL (ref 3.87–5.11)
RDW: 12.4 % (ref 11.5–15.5)
WBC: 7.9 10*3/uL (ref 4.0–10.5)

## 2016-06-26 LAB — GLUCOSE, CAPILLARY: Glucose-Capillary: 99 mg/dL (ref 65–99)

## 2016-06-26 LAB — HEMOGLOBIN A1C
Hgb A1c MFr Bld: 5.4 % (ref 4.8–5.6)
MEAN PLASMA GLUCOSE: 108 mg/dL

## 2016-06-26 LAB — CREATININE, SERUM
Creatinine, Ser: 0.77 mg/dL (ref 0.44–1.00)
GFR calc Af Amer: >60 mL/min (ref >60)
GFR calc non Af Amer: >60 mL/min (ref >60)

## 2016-06-26 MED ORDER — ACETAMINOPHEN 325 MG PO TABS
650.0000 mg | ORAL_TABLET | Freq: Four times a day (QID) | ORAL | Status: DC | PRN
  Administered 2016-06-26 – 2016-07-09 (×26): 650 mg via ORAL
  Filled 2016-06-26 (×26): qty 2

## 2016-06-26 MED ORDER — SENNOSIDES-DOCUSATE SODIUM 8.6-50 MG PO TABS: 1.0000 | ORAL_TABLET | Freq: Every evening | ORAL | Status: DC | PRN

## 2016-06-26 MED ORDER — HYDROCODONE-ACETAMINOPHEN 5-325 MG PO TABS
1.0000 | ORAL_TABLET | ORAL | Status: DC | PRN
  Administered 2016-07-03 (×2): 1 via ORAL
  Filled 2016-06-26 (×3): qty 1

## 2016-06-26 MED ORDER — ATORVASTATIN CALCIUM 40 MG PO TABS: 40.0000 mg | ORAL_TABLET | Freq: Every day | ORAL | Status: DC

## 2016-06-26 MED ORDER — ENOXAPARIN SODIUM 40 MG/0.4ML ~~LOC~~ SOLN
40.0000 mg | SUBCUTANEOUS | Status: DC
  Administered 2016-06-27 – 2016-07-09 (×13): 40 mg via SUBCUTANEOUS
  Filled 2016-06-26 (×13): qty 0.4

## 2016-06-26 MED ORDER — ASPIRIN EC 81 MG PO TBEC
81.0000 mg | DELAYED_RELEASE_TABLET | Freq: Every day | ORAL | Status: DC
  Administered 2016-06-27 – 2016-07-10 (×14): 81 mg via ORAL
  Filled 2016-06-26 (×14): qty 1

## 2016-06-26 MED ORDER — ATORVASTATIN CALCIUM 40 MG PO TABS
40.0000 mg | ORAL_TABLET | Freq: Every day | ORAL | Status: DC
  Administered 2016-06-27 – 2016-07-09 (×13): 40 mg via ORAL
  Filled 2016-06-26 (×13): qty 1

## 2016-06-26 MED ORDER — ENOXAPARIN SODIUM 40 MG/0.4ML ~~LOC~~ SOLN: 40.0000 mg | SUBCUTANEOUS | Status: DC

## 2016-06-26 MED ORDER — CLOPIDOGREL BISULFATE 75 MG PO TABS: 75.0000 mg | ORAL_TABLET | Freq: Every day | ORAL | Status: DC

## 2016-06-26 MED ORDER — CLOPIDOGREL BISULFATE 75 MG PO TABS
75.0000 mg | ORAL_TABLET | Freq: Every day | ORAL | Status: DC
  Administered 2016-06-27 – 2016-07-10 (×14): 75 mg via ORAL
  Filled 2016-06-26 (×14): qty 1

## 2016-06-26 MED ORDER — ONDANSETRON HCL 4 MG/2ML IJ SOLN: 4.0000 mg | Freq: Four times a day (QID) | INTRAMUSCULAR | Status: DC | PRN

## 2016-06-26 MED ORDER — SORBITOL 70 % SOLN
30.0000 mL | Freq: Every day | Status: DC | PRN
  Administered 2016-07-08: 30 mL via ORAL
  Filled 2016-06-26: qty 30

## 2016-06-26 MED ORDER — ONDANSETRON HCL 4 MG PO TABS: 4.0000 mg | ORAL_TABLET | Freq: Four times a day (QID) | ORAL | Status: DC | PRN

## 2016-06-26 NOTE — Progress Notes (Signed)
Report given to Neoma Laming at [facility].

## 2016-06-26 NOTE — Progress Notes (Signed)
Occupational Therapy Treatment Patient Details Name: Melanie Hendricks MRN: [id number] DOB: 29-Sep-1935 Today's Date: 06/26/2016    History of present illness 80 y.o. female with medical history significant of skin melanoma, who presents with slurred speech, deviation of eyes to the right side, right facial droop, right-sided numbness. Patient fractured her left humerus 5 weeks ago and has kept her left upper extremity in a sling. MRI on 10/17 + for acute L pontine infarct.    OT comments  Pt. Making gains with skilled OT with focus of today's session bed mobility and stand pivot transfers in preparation for increasing safety and independence with ADLS.  Min A +2 for physical assist and safety.  Pt. Has strong family assistance and support.  Remains excellent candidate for CIR.    Follow Up Recommendations  CIR;Supervision/Assistance - 24 hour    Equipment Recommendations       Recommendations for Other Services Rehab consult    Precautions / Restrictions Precautions Precautions: Fall Restrictions LUE Weight Bearing: Non weight bearing Other Position/Activity Restrictions: No orders but kept pt NWB throughout session.       Mobility Bed Mobility Overal bed mobility: Needs Assistance Bed Mobility: Supine to Sit     Supine to sit: Mod assist     General bed mobility comments: pt. able to initiate moving  BLES towards eob.  required mod a to bring trunk upright while using R ue to pull up on bed rail. able to scoot hips towards eob with min/mod a.  educated on keeping weight through feet to maintain sitting balance.    Transfers Overall transfer level: Needs assistance Equipment used: 2 person hand held assist Transfers: Sit to/from [facility] Sit to Stand: Min assist;+2 physical assistance Stand pivot transfers: Min guard;+2 physical assistance       General transfer comment: cues for transfer preparation, hand placement.  Assist to help her come forward  and stability assist.  dtr in law assisted with sit/stand and stand/pivot to recliner.  initial cues to bring weigh off of heels.  able to take approx. 4 shuffled steps to the R to the recliner.  able to reach for arm rest with R UE and sit in recliner.      Balance                                   ADL Overall ADL's : Needs assistance/impaired                         Toilet Transfer: Minimal assistance;+2 for physical assistance;Cueing for safety;Cueing for sequencing;Stand-pivot Toilet Transfer Details (indicate cue type and reason): simulated with eob to recliner transfer.  dtr. in law will be main caregiver so she assisted with transfer/family education.   Toileting- Clothing Manipulation and Hygiene: Maximal assistance;Sit to/from stand Toileting - Clothing Manipulation Details (indicate cue type and reason): simulated during transfer in room     Functional mobility during ADLs: Minimal assistance;+2 for physical assistance General ADL Comments: dtr. and dtr. in law very engaged and active in assisting pt.        Vision                     Perception     Praxis      Cognition   Behavior During Therapy: [facility] for tasks assessed/performed Overall Cognitive Status: Within Functional Limits for tasks  assessed                       Extremity/Trunk Assessment               Exercises     Shoulder Instructions       General Comments      Pertinent Vitals/ Pain       Pain Assessment: 0-10 Pain Location: L wrist Pain Descriptors / Indicators: Aching Pain Intervention(s): Limited activity within patient's tolerance;Monitored during session;Ice applied  Home Living                                          Prior Functioning/Environment              Frequency  Min 2X/week        Progress Toward Goals  OT Goals(current goals can now be found in the care plan section)  Progress towards OT goals:  Progressing toward goals     Plan Discharge plan remains appropriate    Co-evaluation                 End of Session Equipment Utilized During Treatment: Gait belt   Activity Tolerance Patient tolerated treatment well   Patient Left in chair;with call bell/phone within reach;with nursing/sitter in room;with family/visitor present   Nurse Communication Mobility status;Other (comment) (discussed with CNA pts. transfer technique for safe stand pivot transfers) RN made aware of pt. Concerns about L  Wrist pain and reports of decreased ROM.  States she Will alert MD.          [id number] OT Time Calculation (min): 28 min  Charges: OT General Charges $OT Visit: 1 Procedure OT Treatments $Self Care/Home Management : 23-37 mins  Janice Coffin, COTA/L 06/26/2016, 9:29 AM

## 2016-06-26 NOTE — Interval H&P Note (Signed)
**Note Melanie Hendricks** Melanie Hendricks was admitted today to Inpatient Rehabilitation with the diagnosis of left pontine infarct.  The patient's history has been reviewed, patient examined, and there is no change in status.  Patient continues to be appropriate for intensive inpatient rehabilitation.  I have reviewed the patient's chart and labs.  Questions were answered to the patient's satisfaction. The PAPE has been reviewed and assessment remains appropriate.  Melanie Hendricks 06/26/2016, 11:52 PM

## 2016-06-26 NOTE — H&P (View-Only) (Signed)
Physical Medicine and Rehabilitation Admission H&P    Chief Complaint  Patient presents with  . Aphasia  . Facial Droop  : HPI: Melanie Hendricks is a 80 y.o. right handed female with history of skin melanoma and recent left humerus fracture five weeks ago after fall with sling and NWB.   Marland Kitchen Presented 06/23/2016 with slurred speech and right-sided weakness with facial droop. Per chart review patient lives with children independent prior to admission.Family works during the day. One level home. 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 Patient did not receive TPA. Carotid Dopplers with no obvious ICA stenosis. Echocardiogram With ejection fraction of 60% and grade 1 diastolic dysfunction. EEG was no seizure activity. Neurology consulted currently on aspirin and Plavix for CVA prophylaxis. Subcutaneous Lovenox for DVT prophylaxis. Tolerating a regular diet. Physical and occupational therapy evaluations completed 06/24/2016 with recommendations of physical medicine rehabilitation consult.Patient was admitted for comprehensive rehabilitation program  ROS Constitutional: Negative for chills and fever.  HENT: Negative for hearing loss.   Eyes: Negative for blurred vision and double vision.  Respiratory: Negative for cough and shortness of breath.   Cardiovascular: Negative for chest pain, palpitations and leg swelling.  Gastrointestinal: Positive for constipation. Negative for nausea and vomiting.  Genitourinary: Negative for dysuria and hematuria.  Musculoskeletal: Positive for myalgias.  Skin: Negative for rash.  Neurological: Positive for sensory change, speech change and weakness. Negative for headaches.  All other systems reviewed and are negative   Past Medical History:  Diagnosis Date  . Fx humeral neck   . Skin melanoma ([place])    Past  Surgical History:  Procedure Laterality Date  . TONSILLECTOMY     Family History  Problem Relation Age of Onset  . Stomach cancer Mother   . Thyroid cancer Father   . Heart attack Father   . Seizures Brother   . Heart failure Brother    Social History:  reports that she has never smoked. She has never used smokeless tobacco. She reports that she does not drink alcohol or use drugs. Allergies: No Known Allergies Medications Prior to Admission  Medication Sig Dispense Refill  . acetaminophen (TYLENOL) 325 MG tablet Take 325 mg by mouth every 6 (six) hours as needed for mild pain or headache.    Marland Kitchen aspirin EC 325 MG tablet Take 325 mg by mouth daily as needed for moderate pain.    Marland Kitchen HYDROcodone-acetaminophen (NORCO/VICODIN) 5-325 MG tablet Take 1 tablet by mouth every 4 (four) hours as needed. 15 tablet 0  . ondansetron (ZOFRAN ODT) 4 MG disintegrating tablet Take 1 tablet (4 mg total) by mouth every 8 (eight) hours as needed for nausea or vomiting. 20 tablet 0    Home: Home Living Family/patient expects to be discharged to:: Private residence Living Arrangements: Children Available Help at Discharge: Family, Available PRN/intermittently Type of Home: House Home Access: Stairs to enter [employer] of Steps: 2 small steps Home Layout: One level Bathroom Shower/Tub: Chiropodist: Standard Home Equipment: None  Lives With: Son (dtr-in-law)   Functional History: Prior Function Level of Independence: Independent  Functional Status:  Mobility: Bed Mobility Overal bed mobility: Needs Assistance Bed Mobility: Supine to Sit Rolling: Max assist Sidelying to sit: Max assist, +2 for safety/equipment, +2 for physical assistance Supine to sit: Mod assist General bed mobility comments: Pt up in chair upon  arrival Transfers Overall transfer level: Needs assistance Equipment used: 1 person hand held assist Transfers: Sit to/from Stand Sit to Stand: +2  safety/equipment, Min assist Stand pivot transfers: Min guard, +2 physical assistance General transfer comment: cues for hand placement and scooting forward; assist to power up into standing and to steady upon stand Ambulation/Gait Ambulation/Gait assistance: Max assist, +2 physical assistance Ambulation Distance (Feet): 10 Feet Assistive device: 2 person hand held assist Gait Pattern/deviations: Step-to pattern, Decreased stance time - left, Decreased step length - right, Decreased step length - left, Decreased weight shift to right General Gait Details: cues for sequencing, posture, and weight shifting to midline; assist to weight shift, maintain balance, and advance L LE at times; pt fatigued and required mod +2 to return to sitting and recliner brought up to pt's legs by daughter    ADL: ADL Overall ADL's : Needs assistance/impaired Eating/Feeding Details (indicate cue type and reason): Reports daughter-in law has been feeding today. Encouraged self feeding. Grooming: Moderate assistance, Sitting Upper Body Bathing: Moderate assistance, Sitting Lower Body Bathing: Maximal assistance, +2 for physical assistance, Sit to/from stand Upper Body Dressing : Moderate assistance, Sitting Upper Body Dressing Details (indicate cue type and reason): to don sling Lower Body Dressing: Maximal assistance, Sit to/from stand, +2 for physical assistance Toilet Transfer: Minimal assistance, +2 for physical assistance, Cueing for safety, Cueing for sequencing, Stand-pivot Toilet Transfer Details (indicate cue type and reason): simulated with eob to recliner transfer.  dtr. in law will be main caregiver so she assisted with transfer/family education.   Toileting- Clothing Manipulation and Hygiene: Maximal assistance, Sit to/from stand Toileting - Clothing Manipulation Details (indicate cue type and reason): simulated during transfer in room Functional mobility during ADLs: Minimal assistance, +2 for physical  assistance General ADL Comments: dtr. and dtr. in law very engaged and active in assisting pt.    Cognition: Cognition Overall Cognitive Status: Within Functional Limits for tasks assessed Arousal/Alertness: Awake/alert Orientation Level: Oriented X4 Attention: Sustained Sustained Attention: Appears intact Memory: Appears intact Awareness: Appears intact Problem Solving: Appears intact Safety/Judgment: Appears intact Cognition Arousal/Alertness: Awake/alert Behavior During Therapy: WFL for tasks assessed/performed, Flat affect Overall Cognitive Status: Within Functional Limits for tasks assessed  Physical Exam: Blood pressure (!) 162/62, pulse (!) 105, temperature 98 F (36.7 C), temperature source Oral, resp. rate 20, height 5\' 6"  (1.676 m), weight 109 kg (240 lb 6.4 oz), SpO2 100 %. Physical Exam Constitutional: She is oriented to person, place, and time. She appears well-developed.  Eyes: EOM are normal.  Neck: Normal range of motion. Neck supple. Thyromegaly present.  Cardiovascular: Normal rate and regular rhythm.   Respiratory: Effort normal and breath sounds normal. No respiratory distress.  GI: Soft. Bowel sounds are normal. She exhibits no distension.  Musculoskeletal:  LUE in sling  Neurological: She is alert and oriented to person, place, and time.  Follows commands .Good awareness of deficits. No dysarthria or CN findings. Motor: 4/5 right biceps, triceps, wrist and HI and 3/5 right HF, 3+KE and 4+ ADF/PF with decreased [place] upper and lower extremity. LUE limited by ortho/sling. LLE 5/5 prox to distal. Cognitively intact  Skin: Skin is warm.  Psychiatric: She has a normal mood and affect. Her behavior is normal. Judgment and thought content normal   Results for orders placed or performed during the hospital encounter of 06/23/16 (from the past 48 hour(s))  Glucose, capillary     Status: Abnormal   Collection Time: 06/25/16  6:05 AM  Result Value Ref  Range    Glucose-Capillary 100 (H) 65 - 99 mg/dL   Comment 1 Notify RN    Comment 2 Document in Chart   Glucose, capillary     Status: None   Collection Time: 06/26/16  5:24 AM  Result Value Ref Range   Glucose-Capillary 99 65 - 99 mg/dL   Comment 1 Notify RN    Comment 2 Document in Chart    No results found.     Medical Problem List and Plan: 1.  Right hemiplegia secondary to left pontine infarct  -admit to inpatient rehab 2.  DVT Prophylaxis/Anticoagulation: Subcutaneous Lovenox. Monitor platelet counts of any signs of bleeding 3. Pain Management: Hydrocodone as needed 4. Left humerus fracture 5 weeks ago after a fall. Shoulder sling, nonweightbearing. 5. Neuropsych: This patient is capable of making decisions on her own behalf. 6. Skin/Wound Care: Routine skin checks, pressure relief as appropriate 7. Fluids/Electrolytes/Nutrition: Routine I&O with follow-up chemistries 8. Hyperlipidemia. Lipitor 9. Left humerus fracture 5 weeks ago after a fall. Nonweightbearing. Continue sling  -LUE elevation/compression to help control edema    Post Admission Physician Evaluation: 1. Functional deficits secondary  to left pontine infarct, recent left humerus fx. 2. Patient is admitted to receive collaborative, interdisciplinary care between the physiatrist, rehab nursing staff, and therapy team. 3. Patient's level of medical complexity and substantial therapy needs in context of that medical necessity cannot be provided at a lesser intensity of care such as a SNF. 4. Patient has experienced substantial functional loss from his/her baseline which was documented above under the "Functional History" and "Functional Status" headings.  Judging by the patient's diagnosis, physical exam, and functional history, the patient has potential for functional progress which will result in measurable gains while on inpatient rehab.  These gains will be of substantial and practical use upon discharge  in facilitating  mobility and self-care at the household level. 5. Physiatrist will provide 24 hour management of medical needs as well as oversight of the therapy plan/treatment and provide guidance as appropriate regarding the interaction of the two. 6. 24 hour rehab nursing will assist with bladder management, bowel management, safety, skin/wound care, disease management, medication administration, pain management and patient education  and help integrate therapy concepts, techniques,education, etc. 7. PT will assess and treat for/with: Lower extremity strength, range of motion, stamina, balance, functional mobility, safety, adaptive techniques and equipment, NMR, pain mgt, ortho precautions.   Goals are: mod I to supervision. 8. OT will assess and treat for/with: ADL's, functional mobility, safety, upper extremity strength, adaptive techniques and equipment, NMR, ortho precautions, family education, pain mgt.   Goals are: mod I to min assist. Therapy may proceed with showering this patient. 9. SLP will assess and treat for/with: n/a.  Goals are: n/a. 10. Case Management and Social Worker will assess and treat for psychological issues and discharge planning. 11. Team conference will be held weekly to assess progress toward goals and to determine barriers to discharge. 12. Patient will receive at least 3 hours of therapy per day at least 5 days per week. 13. ELOS: 10-14 days       14. Prognosis:  excellent     Meredith Staggers, MD, [facility] 06/26/2016  06/26/2016

## 2016-06-26 NOTE — Progress Notes (Signed)
Rehab admissions - I have insurance approval and will admit patient to inpatient rehab today.  Call me for questions.  RC:[phone]

## 2016-06-26 NOTE — Progress Notes (Signed)
Physical Therapy Treatment Patient Details Name: Melanie Hendricks MRN: [id number] DOB: 01-23-1936 Today's Date: 06/26/2016    History of Present Illness 80 y.o. female with medical history significant of skin melanoma, who presents with slurred speech, deviation of eyes to the right side, right facial droop, right-sided numbness. Patient fractured her left humerus 5 weeks ago and has kept her left upper extremity in a sling. MRI on 10/17 + for acute L pontine infarct.     PT Comments    Patient required +2 for transfers and ambulation. Pt is very motivated to return to independence and family present and actively participating. Current plan remains appropriate.   Follow Up Recommendations  CIR     Equipment Recommendations  Other (comment) (TBA)    Recommendations for Other Services Rehab consult     Precautions / Restrictions Precautions Precautions: Fall Restrictions Weight Bearing Restrictions: No LUE Weight Bearing: Non weight bearing    Mobility  Bed Mobility               General bed mobility comments: Pt up in chair upon arrival  Transfers Overall transfer level: Needs assistance Equipment used: 1 person hand held assist Transfers: Sit to/from Stand Sit to Stand: +2 safety/equipment;Min assist         General transfer comment: cues for hand placement and scooting forward; assist to power up into standing and to steady upon stand  Ambulation/Gait Ambulation/Gait assistance: Max assist;+2 physical assistance Ambulation Distance (Feet): 10 Feet Assistive device: 2 person hand held assist Gait Pattern/deviations: Step-to pattern;Decreased stance time - left;Decreased step length - right;Decreased step length - left;Decreased weight shift to right     General Gait Details: cues for sequencing, posture, and weight shifting to midline; assist to weight shift, maintain balance, and advance L LE at times; pt fatigued and required mod +2 to return to sitting  and recliner brought up to pt's legs by daughter   Stairs            Wheelchair Mobility    Modified Rankin (Stroke Patients Only) Modified Rankin (Stroke Patients Only) Pre-Morbid Rankin Score: No symptoms Modified Rankin: Severe disability     Balance     Sitting balance-Leahy Scale: Fair Sitting balance - Comments: improved without assist and pt able to maintain midline orientation.  Scooted to EOB with guard assist      Standing balance-Leahy Scale: Poor                      Cognition Arousal/Alertness: Awake/alert Behavior During Therapy: WFL for tasks assessed/performed;Flat affect Overall Cognitive Status: Within Functional Limits for tasks assessed                      Exercises      General Comments General comments (skin integrity, edema, etc.): familly present and actively participating      Pertinent Vitals/Pain Pain Assessment: Faces Faces Pain Scale: Hurts little more Pain Location: L wrist and shoulder Pain Descriptors / Indicators: Aching;Sore;Guarding Pain Intervention(s): Limited activity within patient's tolerance;Monitored during session;Premedicated before session;Repositioned    Home Living         Home Access: Stairs to enter            Prior Function            PT Goals (current goals can now be found in the care plan section) Acute Rehab PT Goals Patient Stated Goal: get back to being independent PT Goal Formulation:  With patient Time For Goal Achievement: 07/08/16 Potential to Achieve Goals: Good Progress towards PT goals: Progressing toward goals    Frequency    Min 3X/week      PT Plan Current plan remains appropriate    Co-evaluation             End of Session Equipment Utilized During Treatment: Gait belt;Other (comment) (L UE sling) Activity Tolerance: Patient tolerated treatment well Patient left: in chair;with call bell/phone within reach;with chair alarm set;with  family/visitor present     Time: [id number] PT Time Calculation (min) (ACUTE ONLY): 24 min  Charges:  $Gait Training: 8-22 mins $Therapeutic Activity: 8-22 mins                    G Codes:      Salina April, PTA Pager: [phone]   06/26/2016, 1:42 PM

## 2016-06-26 NOTE — PMR Pre-admission (Signed)
PMR Admission Coordinator Pre-Admission Assessment  Patient: Melanie Hendricks is an 80 y.o., female MRN: [id number] DOB: [birth year] Height: '5\' 6"'  (167.6 cm) Weight: 109 kg (240 lb 6.4 oz)              Insurance Information HMO: Yes    PPO:       PCP:       IPA:       80/20:       OTHER:   PRIMARY:  [employer] medicare      Policy#: [id number]      Subscriber: Shanda Bumps CM Name: Sharlett Iles      Phone#: [phone]      Fax#: [phone] (has EPIC access) Pre-Cert#: [id number]      Employer: Retired Benefits:  Phone #: [phone]     Name:  Hanley Seamen. Date: 02/06/16     Deduct: $0      Out of Pocket Max: $4900 (met $399.00)      Life Max: None CIR: $345 days 1-5      SNF: $0 days 1-20; $160 days 21-51; $0 days 52-100 Outpatient: medical necessity     Co-Pay: $40 Home Health: 100%      Co-Pay: none DME: 80%     Co-Pay: 20% Providers: in network  Emergency Contact Information Contact Information    Name Relation Home Work [place] Son   [place] Daughter   [phone]   [place], Knisely Relative   [phone]     Current Medical History  Patient Admitting Diagnosis: L pontine infarct  History of Present Illness: A 80 y.o. right handed female with history of skin melanoma and recent left humerus fracture five weeks ago after fall with sling and NWB.   Marland Kitchen Presented 06/23/2016 with slurred speech and right-sided weakness with facial droop. Per chart review patient lives with children independent prior to admission. Family works during the day. One level home. 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 Patient did not receive TPA. Carotid Dopplers with no obvious ICA stenosis. Echocardiogram and EEG pending. Neurology consulted currently on aspirin and Plavix for CVA prophylaxis. Subcutaneous Lovenox for DVT  prophylaxis. Tolerating a regular diet. Physical and occupational therapy evaluations completed 06/24/2016 with recommendations of physical medicine rehabilitation consult.    Total: 6=NIH  Past Medical History  Past Medical History:  Diagnosis Date  . Fx humeral neck   . Skin melanoma ([place])     Family History  family history includes Heart attack in her father; Heart failure in her brother; Seizures in her brother; Stomach cancer in her mother; Thyroid cancer in her father.  Prior Rehab/Hospitalizations: No previous rehab.  Suffered a three part left humerus fracture 5 weeks ago, no surgery, has a sling in place.  Has the patient had major surgery during 100 days prior to admission? No  Current Medications   Current Facility-Administered Medications:  .   stroke: mapping our early stages of recovery book, , Does not apply, Once, Ivor Costa, MD .  0.9 %  sodium chloride infusion, , Intravenous, Continuous, Ivor Costa, MD, Last Rate: 75 mL/hr at 06/24/16 1739, 75 mL at 06/24/16 1739 .  acetaminophen (TYLENOL) tablet 650 mg, 650 mg, Oral, Q6H PRN, Ivor Costa, MD, 650 mg at 06/26/16 1105 .  aspirin EC tablet 81 mg, 81 mg, Oral, Daily, Donzetta Starch, NP, 81 mg  at 06/26/16 0914 .  atorvastatin (LIPITOR) tablet 40 mg, 40 mg, Oral, q1800, Donzetta Starch, NP, 40 mg at 06/25/16 1728 .  clopidogrel (PLAVIX) tablet 75 mg, 75 mg, Oral, Daily, Donzetta Starch, NP, 75 mg at 06/26/16 0914 .  enoxaparin (LOVENOX) injection 40 mg, 40 mg, Subcutaneous, Q24H, Ivor Costa, MD, 40 mg at 06/26/16 0913 .  hydrALAZINE (APRESOLINE) injection 5 mg, 5 mg, Intravenous, Q2H PRN, Ivor Costa, MD .  HYDROcodone-acetaminophen (NORCO/VICODIN) 5-325 MG per tablet 1 tablet, 1 tablet, Oral, Q4H PRN, Ivor Costa, MD .  ondansetron [facility]) injection 4 mg, 4 mg, Intravenous, Q8H PRN, Ivor Costa, MD .  senna-docusate (Senokot-S) tablet 1 tablet, 1 tablet, Oral, QHS PRN, Ivor Costa, MD .  zolpidem (AMBIEN) tablet 5 mg, 5 mg, Oral, QHS  PRN, Ivor Costa, MD  Patients Current Diet: Diet regular Room service appropriate? Yes; Fluid consistency: Thin Diet - low sodium heart healthy Diet - low sodium heart healthy  Precautions / Restrictions Precautions Precautions: Fall Restrictions Weight Bearing Restrictions: No LUE Weight Bearing: Non weight bearing Other Position/Activity Restrictions: No orders but kept pt NWB throughout session.   Has the patient had 2 or more falls or a fall with injury in the past year?Yes.  Had 1 fall which resulted in left humerus fracture 5 weeks ago.  Prior Activity Level Limited Community (1-2x/wk): Went out 1 X a week, was driving.  Home Assistive Devices / Equipment Home Assistive Devices/Equipment: None Home Equipment: None  Prior Device Use: Indicate devices/aids used by the patient prior to current illness, exacerbation or injury? None  Prior Functional Level Prior Function Level of Independence: Independent  Self Care: Did the patient need help bathing, dressing, using the toilet or eating?  Independent  Indoor Mobility: Did the patient need assistance with walking from room to room (with or without device)? Independent  Stairs: Did the patient need assistance with internal or external stairs (with or without device)? Independent  Functional Cognition: Did the patient need help planning regular tasks such as shopping or remembering to take medications? Independent  Current Functional Level Cognition  Arousal/Alertness: Awake/alert Overall Cognitive Status: Within Functional Limits for tasks assessed Orientation Level: Oriented X4 Attention: Sustained Sustained Attention: Appears intact Memory: Appears intact Awareness: Appears intact Problem Solving: Appears intact Safety/Judgment: Appears intact    Extremity Assessment (includes Sensation/Coordination)  Upper Extremity Assessment: RUE deficits/detail, LUE deficits/detail RUE Deficits / Details: Strength at least  3+/5. Poor gross/fine motor coordination. RUE Coordination: decreased fine motor, decreased gross motor LUE Deficits / Details: Pt able to wiggle fingers; limited evaluation due to immobilzation. LUE: Unable to fully assess due to immobilization  Lower Extremity Assessment: RLE deficits/detail, LLE deficits/detail RLE Deficits / Details: movement mildly uncoordinated, hip flexors 4-, quads 4, hams 4, df/pf 4-/5.  In standing, pt unable to control the knee in stance. RLE Coordination: decreased fine motor, decreased gross motor LLE Deficits / Details: 4+/5 strength grossly except 4/5 hip flexors, otherwise functional    ADLs  Overall ADL's : Needs assistance/impaired Eating/Feeding Details (indicate cue type and reason): Reports daughter-in law has been feeding today. Encouraged self feeding. Grooming: Moderate assistance, Sitting Upper Body Bathing: Moderate assistance, Sitting Lower Body Bathing: Maximal assistance, +2 for physical assistance, Sit to/from stand Upper Body Dressing : Moderate assistance, Sitting Upper Body Dressing Details (indicate cue type and reason): to don sling Lower Body Dressing: Maximal assistance, Sit to/from stand, +2 for physical assistance Toilet Transfer: Minimal assistance, +2 for physical assistance, Cueing for  safety, Cueing for sequencing, Stand-pivot Toilet Transfer Details (indicate cue type and reason): simulated with eob to recliner transfer.  dtr. in law will be main caregiver so she assisted with transfer/family education.   Toileting- Clothing Manipulation and Hygiene: Maximal assistance, Sit to/from stand Toileting - Clothing Manipulation Details (indicate cue type and reason): simulated during transfer in room Functional mobility during ADLs: Minimal assistance, +2 for physical assistance General ADL Comments: dtr. and dtr. in law very engaged and active in assisting pt.      Mobility  Overal bed mobility: Needs Assistance Bed Mobility: Supine to  Sit Rolling: Max assist Sidelying to sit: Max assist, +2 for safety/equipment, +2 for physical assistance Supine to sit: Mod assist General bed mobility comments: Pt up in chair upon arrival    Transfers  Overall transfer level: Needs assistance Equipment used: 1 person hand held assist Transfers: Sit to/from Stand Sit to Stand: +2 safety/equipment, Min assist Stand pivot transfers: Min guard, +2 physical assistance General transfer comment: cues for hand placement and scooting forward; assist to power up into standing and to steady upon stand    Ambulation / Gait / Stairs / Wheelchair Mobility  Ambulation/Gait Ambulation/Gait assistance: Max assist, +2 physical assistance Ambulation Distance (Feet): 10 Feet Assistive device: 2 person hand held assist Gait Pattern/deviations: Step-to pattern, Decreased stance time - left, Decreased step length - right, Decreased step length - left, Decreased weight shift to right General Gait Details: cues for sequencing, posture, and weight shifting to midline; assist to weight shift, maintain balance, and advance L LE at times; pt fatigued and required mod +2 to return to sitting and recliner brought up to pt's legs by daughter    Posture / Balance Dynamic Sitting Balance Sitting balance - Comments: improved without assist and pt able to maintain midline orientation.  Scooted to EOB with guard assist  Balance Overall balance assessment: Needs assistance Sitting-balance support: Single extremity supported Sitting balance-Leahy Scale: Fair Sitting balance - Comments: improved without assist and pt able to maintain midline orientation.  Scooted to EOB with guard assist  Postural control: Posterior lean, Right lateral lean Standing balance support: Single extremity supported Standing balance-Leahy Scale: Poor Standing balance comment: worked in standing at EOB on w/shift, midline balance, R knee control, stepping with R and L LE's  2 person assist, but  for safety not as much physical assist    Special needs/care consideration BiPAP/CPAP No CPM No Continuous Drip IV 0.9% NS 75 mL/hr Dialysis No      Life Vest No Oxygen No Special Bed No Trach Size No Wound Vac (area) No     Skin Had melanoma right arm and on left eye, treated by dermatologist                             Bowel mgmt: Last BM 06/23/16 Bladder mgmt: Foley catheter in place Diabetic mgmt No    Previous Home Environment Living Arrangements: Children  Lives With: Son (dtr-in-law) Available Help at Discharge: Family, Available PRN/intermittently Type of Home: House Home Layout: One level Home Access: Stairs to enter [employer] of Steps: 2 small steps Bathroom Shower/Tub: Chiropodist: Standard Home Care Services: No  Discharge Living Setting Plans for Discharge Living Setting: Patient's home, House, Lives with (comment) (Lives with son and daughter-in-law.) Type of Home at Discharge: Apartment Discharge Home Layout: One level Discharge Home Access: Stairs to enter Entrance Stairs-Number of Steps: 2 small  steps entry Does the patient have any problems obtaining your medications?: No  Social/Family/Support Systems Patient Roles: Parent (Has son, dtr and dtr-in-law.) Contact Information: Antionetta Ator - son - [phone] Anticipated Caregiver: son, dtr and dtr-in-law Anticipated Caregiver's Contact Information: Spring Mellette - [phone] Ability/Limitations of Caregiver: Son and dtr-in-law work but can check on patient.  Dtr from [facility] may stay with patient for a while after discharge home if needed.  She is currently visiting from [place] until 07/13/16. Caregiver Availability: Intermittent Discharge Plan Discussed with Primary Caregiver: Yes Is Caregiver In Agreement with Plan?: Yes Does Caregiver/Family have Issues with Lodging/Transportation while Pt is in Rehab?: No  Goals/Additional Needs Patient/Family Goal for Rehab: PT mod  I, OT mod I and supervision goals Expected length of stay: 9-14 days Cultural Considerations: None Dietary Needs: Regular diet, thin liquids Equipment Needs: TBD Pt/Family Agrees to Admission and willing to participate: Yes Program Orientation Provided & Reviewed with Pt/Caregiver Including Roles  & Responsibilities: Yes  Decrease burden of Care through IP rehab admission: N/A  Possible need for SNF placement upon discharge: Not anticipated  Patient Condition: This patient's condition remains as documented in the consult dated 06/25/16, in which the Rehabilitation Physician determined and documented that the patient's condition is appropriate for intensive rehabilitative care in an inpatient rehabilitation facility. Will admit to inpatient rehab today.   Preadmission Screen Completed By:  Retta Diones, 06/26/2016 5:25 PM ______________________________________________________________________   Discussed status with Dr. Naaman Plummer on 06/26/16 at 1725 and received telephone approval for admission today.  Admission Coordinator:  Retta Diones, time1725/Date10/20/17

## 2016-06-26 NOTE — Progress Notes (Signed)
STROKE TEAM PROGRESS NOTE   SUBJECTIVE (INTERVAL HISTORY) No family at bedside. Patient awaiting transfer to rehab when bed available. OBJECTIVE Temp:  [97.6 F (36.4 C)-98.5 F (36.9 C)] 97.6 F (36.4 C) (10/20 1043) Pulse Rate:  [86-105] 105 (10/20 1043) Cardiac Rhythm: Normal sinus rhythm (10/20 0700) Resp:  [18-20] 20 (10/20 1043) BP: (142-178)/(44-65) 159/54 (10/20 1043) SpO2:  [94 %-99 %] 98 % (10/20 1043)  CBC:   Recent Labs Lab 06/23/16 2121 06/23/16 2134  WBC 7.9  --   NEUTROABS 5.8  --   HGB 13.7 14.3  HCT 40.9 42.0  MCV 90.7  --   PLT 271  --     Basic Metabolic Panel:   Recent Labs Lab 06/23/16 2121 06/23/16 2134  NA 137 139  K 4.1 4.2  CL 106 108  CO2 21*  --   GLUCOSE 127* 124*  BUN 13 16  CREATININE 0.78 0.70  CALCIUM 9.7  --     Lipid Panel:     Component Value Date/Time   CHOL 242 (H) 06/24/2016 0406   TRIG 64 06/24/2016 0406   HDL 82 06/24/2016 0406   CHOLHDL 3.0 06/24/2016 0406   VLDL 13 06/24/2016 0406   LDLCALC 147 (H) 06/24/2016 0406   HgbA1c:  Lab Results  Component Value Date   HGBA1C 5.4 06/24/2016   Urine Drug Screen:     Component Value Date/Time   LABOPIA POSITIVE (A) 06/23/2016 0157   COCAINSCRNUR NONE DETECTED 06/23/2016 0157   LABBENZ NONE DETECTED 06/23/2016 0157   AMPHETMU NONE DETECTED 06/23/2016 0157   THCU NONE DETECTED 06/23/2016 0157   LABBARB NONE DETECTED 06/23/2016 0157      IMAGING  MRI HEAD  06/24/2016 1. 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 2. Mild chronic microvascular ischemic disease.   MRA HEAD  06/24/2016 1. Nonvisualization of the left vertebral artery and majority of the basilar artery, likely occluded. Right vertebral artery patent to the vertebrobasilar junction. PCAs are well perfused, predominantly fetal type in nature and supplied via prominent posterior communicating arteries. 2. Widely patent anterior circulation.   Ct Head Code Stroke W/o  Cm 06/23/2016 1. No acute intracranial abnormality. 2. ASPECTS is 10.   Carotid Doppler   There is no obvious evidence of hemodynamically significant internal carotid artery stenosis bilaterally. Vertebral arteries are patent with antegrade flow.  2D Echocardiogram  pending     PHYSICAL EXAM Pleasant elderly Caucasian lady not in distress. . Afebrile. Head is nontraumatic. Neck is supple without bruit.    Cardiac exam no murmur or gallop. Lungs are clear to auscultation. Distal pulses are well felt.   . Afebrile. Head is nontraumatic. Neck is supple without bruit.    Cardiac exam no murmur or gallop. Lungs are clear to auscultation. Distal pulses are well felt.Left upper extremity is in a sling due to recent fracture Neurological Exam ;  Awake  Alert oriented x 3. Normal speech and language.eye movements full without nystagmus.fundi were not visualized. Vision acuity and fields appear normal. Hearing is normal. Palatal movements are normal. Face symmetric. Tongue midline. Normal strength, tone, reflexes and coordination except left upper extremity testing limited due to fracture and being in a sling. Normal sensation. Gait deferred.  ASSESSMENT/PLAN [name] is a 80 y.o. female with no significant medical history presenting with slurred speech, numbness and tingling and eye deviation to the R. She did not receive IV t-PA due to delay in arrival.   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 Wall thickness was   increased in a pattern of moderate LVH. Systolic function was   normal. The estimated ejection fraction was in the range of 55%   to 60%. Wall motion was normal; there were no regional wall    motion abnormalities.  LDL 147  HgbA1c  5.4  Lovenox for VTE prophylaxis Diet regular Room service appropriate? Yes; Fluid consistency: Thin  aspirin 325 mg daily prior to admission, now on aspirin 325 mg daily. Given large vessel intracranial atherosclerosis, patient should be treated with aspirin 81 mg and clopidogrel 75 mg orally every day x 3 months for secondary stroke prevention. After 3 months, change to plavix alone. Long-term dual antiplatelets are contraindicated due to risk for intracerebral hemorrhage.   Patient counseled to be compliant with her antithrombotic medications  Ongoing aggressive stroke risk factor management  Patient enrolled in Stroke AF Resesarch Trial.  Therapy recommendations:  CIR. Consult request in place  Disposition:  pending   Follow up Dr. Leonie Man in 6 weeks. Order placed.  Elevated BP  No hx HTN  Stable from stroke standpoint, but elevated 140-160s  Permissive hypertension (OK if < 220/120) but gradually normalize in 5-7 days Long-term BP goal normotensive  Hyperlipidemia  Home meds:  No statin  LDL 146, goal < 70  Added statin  Continue statin at discharge  Other Stroke Risk Factors  Advanced age  Obesity, Body mass index is 38.8 kg/m., recommend weight loss, diet and exercise as appropriate   Other Active Problems  Humeral neck fx  Hospital day # [place] for Pager information 06/26/2016 12:35 PM  I have personally examined this patient, reviewed notes, independently viewed imaging studies, participated in medical decision making and plan of care.ROS completed by me personally and pertinent positives fully documented  I have made any additions or clarifications directly to the above note. Agree with note above.  Continue aspirin and Plavix for 3 months followed by aspirin alone. Greater than 50% time during this 15 minute visit was spent on counseling and coordination of care about stroke risk, prevention and treatment  Stroke team will sign off. Follow-up as an outpatient in stroke clinic and in research clinic for the stroke atrial fibrillation trial. Antony Contras, [place] Pager: [phone] 06/26/2016 12:35 PM   To contact Stroke Continuity provider, please refer to [email or web address] After hours, contact General Neurology

## 2016-06-26 NOTE — Progress Notes (Signed)
PROGRESS NOTE    Melanie Hendricks  [id number] DOB: 07-03-1936 DOA: 06/23/2016 PCP: Pcp Not In System    Brief Narrative:  Melanie Hendricks is a 80 y.o. female with medical history significant of skin melanoma, who presents with slurred speech, deviation of eyes to the right side, right facial droop, right-sided numbness.  Per her son, pt laid down for a nap around 1500 and woke up around 1600 with slurred speech, right side gaze, right side numbness. She is also mildly disoriented. Patient states that she also has right foot heaviness. No hearing loss. She has nausea, and vomited once. No abdominal pain or diarrhea. Patient denies chest pain, shortness breath, fever, chills, symptoms of UTI. Per EMS, pt was hypertensive with Bp 201/93, sinus tach, 121, RR 20, CBG 128. Pt had left humeral neck fracture which did not require surgery. She still has mild pain and is currently taking Vicodin and Tylenol for pain.  Pt was found to have WBC 7.9, INR 0.7, negative troponin, pending a urinalysis, electrolytes and renal function okay, temperature normal, tachycardia with heart rate ~110, negative CT head for acute intracranial abnormalities. Patient is placed on telemetry bed for observation. Neurology, Dr. Nicole Kindred was consulted.   Assessment & Plan:   Active Problems:   Stroke (cerebrum) (HCC)   Blood pressure elevated without history of HTN   Stroke-like symptoms   Closed fracture of neck of humerus with routine healing   Stroke (cerebrum): pt's symptoms are very concerning for stroke. CT head is negative for acute intracranial abnormalities. Neurology, Dr. Nicole Kindred was consulted, recommended stroke workup and EEG.  - neurology consulted- appreciate their recommendations - HgbA1c pending- still not back - fasting lipid panel with LDL of 147 - MRI, MRA of the brain without contrast showed: Acute ischemic nonhemorrhagic left paramedian pontine infarct. Mild chronic microvascular ischemic  disease. Nonvisualization of the left vertebral artery and majority of the basilar artery, likely occluded. Right vertebral artery patent to the vertebrobasilar junction. PCAs are well perfused, predominantly fetal type in nature and supplied via prominent posterior communicating arteries. Widely patent anterior circulation. - PT consult, OT consult (can assess patient as bedrest d/c'ed) - Echocardiogram: The cavity size was normal. Wall thickness was increased in a pattern of moderate LVH. Systolic function was normal. The estimated ejection fraction was in the range of 55% to 60%. Wall motion was normal; there were no regional wall motion abnormalities. Doppler parameters are consistent with abnormal left ventricular relaxation (grade 1 diastolic   dysfunction). - Carotid dopplers: There is no obvious evidence of hemodynamically significant, internal carotid artery stenosis bilaterally. Vertebral arteries are patent with antegrade flow. - Continue Aspirin  - Risk factor modification - Telemetry monitoring - EEG, routine adult study pending  Blood pressure elevated without history of HTN: her Bp is 174/74. Patient may have undiagnosed up attention.  -IV hydralazine for SBP>220 (allow permissive hypertension)  Fx humeral neck: -When necessary Vicodin, Tylenol for pain - no pain currently  DVT ppx: SQ Lovenox Code Status: Full code Family Communication: Yes, patient's daughter and daughter-in-law at bed side Disposition Plan: discharge to either SNF or [place] rehab   Consultants:   Neurology  PT  OT  Procedures:   Echocardiogram pending  Antimicrobials:   none    Subjective: Patient seen and evaluated.  Daughter and daughter in law are bedside.  Patient voices she would like to keep catheter in place at inpatient rehab (if that is where she is to discharge) as she  is limited by pain getting out of the bed to use bedside commode.  She denies any chest pain or chest pressure.   Noted to be slightly tachycardic.  Patient states that she slept well overnight and is eating well.  Family willing to discharge to either inpatient rehab or SNF for rehab.  Objective: Vitals:   06/26/16 0519 06/26/16 0822 06/26/16 1043 06/26/16 1347  BP: (!) 149/44 (!) 142/56 (!) 159/54 (!) 149/49  Pulse: 86 98 (!) 105 (!) 102  Resp: 18 18 20 20   Temp: 97.8 F (36.6 C) 98 F (36.7 C) 97.6 F (36.4 C) 98 F (36.7 C)  TempSrc: Oral Oral Oral Oral  SpO2: 98% 99% 98% 99%  Weight:      Height:        Intake/Output Summary (Last 24 hours) at 06/26/16 1652 Last data filed at 06/26/16 1441  Gross per 24 hour  Intake            797.5 ml  Output             1875 ml  Net          -1077.5 ml   Filed Weights   06/23/16 2038 06/24/16 0333  Weight: 90.7 kg (200 lb) 109 kg (240 lb 6.4 oz)    Examination:  General exam: Appears calm and comfortable  Respiratory system: Clear to auscultation. Respiratory effort normal. Cardiovascular system: S1 & S2 heard, RRR. No JVD, murmurs, rubs, gallops or clicks. No pedal edema. Gastrointestinal system: Abdomen is nondistended, soft and nontender. No organomegaly or masses felt. Normal bowel sounds heard. Central nervous system: Alert and oriented. Slight right facial droop- no tongue deviation, no nystagmus, PERRL, EOMI Extremities: Symmetric 5 x 5 power except for left upper extremity secondary to recent fracture Skin: No rashes, lesions or ulcers Psychiatry: Judgement and insight appear normal. Mood & affect appropriate.     Data Reviewed: I have personally reviewed following labs and imaging studies  CBC:  Recent Labs Lab 06/23/16 2121 06/23/16 2134  WBC 7.9  --   NEUTROABS 5.8  --   HGB 13.7 14.3  HCT 40.9 42.0  MCV 90.7  --   PLT 271  --    Basic Metabolic Panel:  Recent Labs Lab 06/23/16 2121 06/23/16 2134  NA 137 139  K 4.1 4.2  CL 106 108  CO2 21*  --   GLUCOSE 127* 124*  BUN 13 16  CREATININE 0.78 0.70  CALCIUM  9.7  --    GFR: Estimated Creatinine Clearance: 73.6 mL/min (by C-G formula based on SCr of 0.7 mg/dL). Liver Function Tests:  Recent Labs Lab 06/23/16 2121  AST 22  ALT 26  ALKPHOS 73  BILITOT 0.4  PROT 7.1  ALBUMIN 4.0   No results for input(s): LIPASE, AMYLASE in the last 168 hours. No results for input(s): AMMONIA in the last 168 hours. Coagulation Profile:  Recent Labs Lab 06/23/16 2121  INR 0.97   Cardiac Enzymes: No results for input(s): CKTOTAL, CKMB, CKMBINDEX, TROPONINI in the last 168 hours. BNP (last 3 results) No results for input(s): PROBNP in the last 8760 hours. HbA1C:  Recent Labs  06/24/16 0406  HGBA1C 5.4   CBG:  Recent Labs Lab 06/24/16 1137 06/25/16 0605 06/26/16 0524  GLUCAP 118* 100* 99   Lipid Profile:  Recent Labs  06/24/16 0406  CHOL 242*  HDL 82  LDLCALC 147*  TRIG 64  CHOLHDL 3.0   Thyroid Function Tests: No results for  input(s): TSH, T4TOTAL, FREET4, T3FREE, THYROIDAB in the last 72 hours. Anemia Panel: No results for input(s): VITAMINB12, FOLATE, FERRITIN, TIBC, IRON, RETICCTPCT in the last 72 hours. Sepsis Labs: No results for input(s): PROCALCITON, LATICACIDVEN in the last 168 hours.  No results found for this or any previous visit (from the past 240 hour(s)).       Radiology Studies: No results found.      Scheduled Meds: .  stroke: mapping our early stages of recovery book   Does not apply Once  . aspirin EC  81 mg Oral Daily  . atorvastatin  40 mg Oral q1800  . clopidogrel  75 mg Oral Daily  . enoxaparin (LOVENOX) injection  40 mg Subcutaneous Q24H   Continuous Infusions: . sodium chloride 75 mL (06/24/16 1739)     LOS: 2 days    Time spent: 35 minutes    Newman Pies, MD [facility] Pager [phone]  If 7PM-7AM, please contact night-coverage [email or web address] Password [email or web address] 06/26/2016, 4:52 PM

## 2016-06-27 ENCOUNTER — Inpatient Hospital Stay (HOSPITAL_COMMUNITY): Payer: Medicare Other | Admitting: Occupational Therapy

## 2016-06-27 ENCOUNTER — Inpatient Hospital Stay (HOSPITAL_COMMUNITY): Payer: Medicare Other | Admitting: Physical Therapy

## 2016-06-27 DIAGNOSIS — S42202A Unspecified fracture of upper end of left humerus, initial encounter for closed fracture: Secondary | ICD-10-CM

## 2016-06-27 DIAGNOSIS — R339 Retention of urine, unspecified: Secondary | ICD-10-CM

## 2016-06-27 DIAGNOSIS — S42295S Other nondisplaced fracture of upper end of left humerus, sequela: Secondary | ICD-10-CM

## 2016-06-27 DIAGNOSIS — I10 Essential (primary) hypertension: Secondary | ICD-10-CM

## 2016-06-27 MED ORDER — LISINOPRIL 2.5 MG PO TABS
2.5000 mg | ORAL_TABLET | Freq: Every day | ORAL | Status: DC
  Administered 2016-06-27 – 2016-06-28 (×2): 2.5 mg via ORAL
  Filled 2016-06-27 (×2): qty 1

## 2016-06-27 NOTE — Progress Notes (Signed)
Patient arrived to Rehab around 1920 to Rm 4W09 with her 3 children present. Explained Rehab routine and safety precautions. Pt and family verbalized understanding at this time. Montanna Mcbain, Dione Plover

## 2016-06-27 NOTE — Evaluation (Signed)
Physical Therapy Assessment and Plan  Patient Details  Name: Melanie Hendricks MRN: [id number] Date of Birth: 07-06-1936  PT Diagnosis: Ataxic gait, Coordination disorder, Hemiplegia dominant and Muscle weakness Rehab Potential: Good ELOS: 14-17 days    Today's Date: 06/27/2016 PT Individual Time: 1300-1400 PT Individual Time Calculation (min): 60 min     Problem List: Patient Active Problem List   Diagnosis Date Noted  . Benign essential HTN   . Urinary retention   . Closed fracture of left proximal humerus   . Left pontine cerebrovascular accident ([place]) 06/26/2016  . Stroke (cerebrum) ([place]) 06/23/2016  . Blood pressure elevated without history of HTN 06/23/2016  . Left pontine stroke ([place]) 06/23/2016  . Stroke-like symptoms   . Eye movement disorder   . Closed fracture of neck of humerus with routine healing     Past Medical History:  Past Medical History:  Diagnosis Date  . Fx humeral neck   . Skin melanoma ([place])    Past Surgical History:  Past Surgical History:  Procedure Laterality Date  . TONSILLECTOMY      Assessment & Plan Clinical Impression: Patient is a 80 y.o.right handed femalewith history of skin melanoma and recent left humerus fracture five weeks ago after fall with sling and NWB. Marland Kitchen Presented 06/23/2016 with slurred speech and right-sided weakness with facial droop. Per chart review patient lives with childrenindependent prior to admission.Family works during the day.One level home. 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 Patient did not receive TPA. Carotid Dopplers with no obvious ICA stenosis. Echocardiogram With ejection fraction of 60% and grade 1 diastolic dysfunction. EEG was no seizure activity. Neurology consulted currently on aspirin and Plavix for CVA prophylaxis. Patient transferred to CIR on  06/26/2016 .   Patient currently requires max with mobility secondary to muscle weakness, ataxia and decreased coordination and decreased standing balance, decreased postural control, hemiplegia, decreased balance strategies and difficulty maintaining precautions.  Prior to hospitalization, patient was modified independent  with mobility and lived with Son (and daughter in Sports coach) in a House home.  Home access is 2 small stepsStairs to enter.  Patient will benefit from skilled PT intervention to maximize safe functional mobility, minimize fall risk and decrease caregiver burden for planned discharge home with 24 hour supervision.  Anticipate patient will benefit from follow up [place] at discharge.  PT - End of Session Activity Tolerance: Tolerates 30+ min activity with multiple rests PT Assessment Rehab Potential (ACUTE/IP ONLY): Good Barriers to Discharge: Inaccessible home environment;Decreased caregiver support PT Patient demonstrates impairments in the following area(s): Balance;Motor;Endurance;Safety PT Transfers Functional Problem(s): Bed Mobility;Bed to Chair;Car;Furniture;Floor PT Locomotion Functional Problem(s): Ambulation;Wheelchair Mobility;Stairs PT Plan PT Intensity: Minimum of 1-2 x/day ,45 to 90 minutes PT Frequency: 5 out of 7 days PT Duration Estimated Length of Stay: 14-17 days  PT Treatment/Interventions: Ambulation/gait training;Balance/vestibular training;Community reintegration;Discharge planning;Disease management/prevention;DME/adaptive equipment instruction;Functional mobility training;Neuromuscular re-education;Pain management;Patient/family education;Psychosocial support;Skin care/wound management;Splinting/orthotics;Therapeutic Exercise;Therapeutic Activities;Stair training;Wheelchair propulsion/positioning;Visual/perceptual remediation/compensation;UE/LE Coordination activities;UE/LE Strength taining/ROM PT Transfers Anticipated Outcome(s): Supervision Assist with LRAD PT  Locomotion Anticipated Outcome(s): Min Assist-SUppervision assist with LRAD  PT Recommendation Follow Up Recommendations: Home health PT Patient destination: Home Equipment Recommended: Wheelchair (measurements);Wheelchair cushion (measurements);Quad cane;To be determined Patient received sitting in recliner and agreeable to PT. PT instructed patient in evaluation and initiated treatment intervention; see below for results. PT educated patient and daughter in PT POC, estimated  Length of stay, goals of rehab and rehab potential. Gait training, WC mobility, and transfers as listed below. Additional gait training with Hemi walker x 5 ft. Decreased posterior lean noted, but weakness from earlier walk prevented increased distance.   Patient returned to room and left sitting in recliner with call bell in reach.   Skilled Therapeutic Intervention   PT Evaluation Precautions/Restrictions Precautions Precautions: Fall Restrictions Weight Bearing Restrictions: Yes LUE Weight Bearing: Non weight bearing General   Vital Signs Pain Pain Assessment Pain Assessment: No/denies pain Pain Score: 0-No pain Home Living/Prior Functioning Home Living Available Help at Discharge: Family;Available PRN/intermittently Type of Home: House Home Access: Stairs to enter [employer] of Steps: 2 small steps Home Layout: One level Bathroom Shower/Tub: Chiropodist: Standard Bathroom Accessibility: Yes  Lives With: Son (and daughter in Sports coach) Prior Function Level of Independence: Independent with basic ADLs;Independent with homemaking with ambulation;Independent with transfers  Able to Take Stairs?: Yes Driving: Yes Vocation: Retired Tax adviser Overall Cognitive Status: Within Functional Limits for tasks assessed Arousal/Alertness: Awake/alert Memory: Appears intact Sensation Sensation Light Touch: Impaired by gross assessment;Appears Intact (intack  BLE. ) Proprioception: Appears Intact Coordination Gross Motor Movements are Fluid and Coordinated: No Fine Motor Movements are Fluid and Coordinated: No Coordination and Movement Description: ataxia in RUE and RLE Motor  Motor Motor: Ataxia Motor - Skilled Clinical Observations: decreased coordination on the RLE with functional movement.   Mobility Bed Mobility Bed Mobility: Supine to Sit Rolling Right: 5: Supervision Rolling Left: 5: Supervision (slight pain in shoulder) Supine to Sit: 4: Min assist Transfers Sit to Stand: 3: Mod assist Sit to Stand Details: Verbal cues for safe use of DME/AE;Manual facilitation for weight shifting;Verbal cues for precautions/safety Locomotion  Ambulation Ambulation: Yes Ambulation/Gait Assistance: 3: Mod assist;2: Max assist Ambulation Distance (Feet): 15 Feet Assistive device: 1 person hand held assist Ambulation/Gait Assistance Details: Verbal cues for gait pattern;Verbal cues for technique;Verbal cues for precautions/safety;Manual facilitation for weight shifting Gait Gait: Yes Gait Pattern: Ataxic;Right steppage Stairs / Additional Locomotion Stairs: No Wheelchair Mobility Wheelchair Mobility: Yes Wheelchair Propulsion: Right upper extremity;Both lower extermities Wheelchair Parts Management: Needs assistance Distance: 148f   Trunk/Postural Assessment  Cervical Assessment Cervical Assessment: Exceptions to [facility](forward head) Thoracic Assessment Thoracic Assessment: Exceptions to [facility](rounded shoulders) Lumbar Assessment Lumbar Assessment: Exceptions to [facility](posterior pelvic tilt) Postural Control Postural Control: Deficits on evaluation  Balance Balance Balance Assessed: Yes Dynamic Sitting Balance Dynamic Sitting - Balance Support: Feet supported;Right upper extremity supported Dynamic Sitting - Level of Assistance: 5: Stand by assistance Dynamic Standing Balance Dynamic Standing - Balance Support: No upper extremity  supported;During functional activity Dynamic Standing - Level of Assistance: 3: Mod assist;2: Max assist Extremity Assessment      RLE Assessment RLE Assessment: Within Functional Limits (4/5 throughout) LLE Assessment LLE Assessment: Within Functional Limits (4/5 throughout)   See Function Navigator for Current Functional Status.   Refer to Care Plan for Long Term Goals  Recommendations for other services: None  Discharge Criteria: Patient will be discharged from PT if patient refuses treatment 3 consecutive times without medical reason, if treatment goals not met, if there is a change in medical status, if patient makes no progress towards goals or if patient is discharged from hospital.  The above assessment, treatment plan, treatment alternatives and goals were discussed and mutually agreed upon: by patient  ALorie Phenix10/21/2017, 3:05 PM

## 2016-06-27 NOTE — Progress Notes (Signed)
[place] PHYSICAL MEDICINE & REHABILITATION     PROGRESS NOTE  Subjective/Complaints:  Pt sitting up in bed this AM, daughter at bedside.  Pt slept well overnight.  She did have some left shoulder pain.  ROS: +Left shoulder pain.  Denies CP, SOB, N/V/D.  Objective: Vital Signs: Blood pressure (!) 173/40, pulse 86, temperature 98.4 F (36.9 C), temperature source Oral, resp. rate 17, height 5\' 6"  (1.676 m), weight 108.3 kg (238 lb 12.1 oz), SpO2 96 %. No results found.  Recent Labs  06/26/16 2040  WBC 7.9  HGB 13.1  HCT 39.7  PLT 265    Recent Labs  06/26/16 2040  CREATININE 0.77   CBG (last 3)   Recent Labs  06/24/16 1137 06/25/16 0605 06/26/16 0524  GLUCAP 118* 100* 99    Wt Readings from Last 3 Encounters:  06/26/16 108.3 kg (238 lb 12.1 oz)  06/24/16 109 kg (240 lb 6.4 oz)  05/14/16 90.7 kg (200 lb)    Physical Exam:  BP (!) 173/40 (BP Location: Right Arm)   Pulse 86   Temp 98.4 F (36.9 C) (Oral)   Resp 17   Ht 5\' 6"  (1.676 m)   Wt 108.3 kg (238 lb 12.1 oz)   SpO2 96%   BMI 38.54 kg/m  Constitutional: She appears well-developed. NAD. Vital signs reviewed. Eyes: EOMare normal. No discharge.  Cardiovascular: Normal rateand regular rhythm.  Respiratory: Effort normaland breath sounds normal. No respiratory distress.  GI: Soft. Bowel sounds are normal. She exhibits no distension.  Musculoskeletal: LUE in sling Neurological: She is alertand oriented.  Follows commands.  Good awareness of deficits.  No dysarthria or CN findings.  Motor: 4/5 right biceps, triceps, wrist and HI  3/5 right HF, 3+KE and 4+ ADF/PF  LUE limited by ortho/sling, ~2+/5.  LLE 5/5 prox to distal.  Musculoskeletal: LUE edema. Skin: Skin is warm and dry.  Psychiatric: She has a normal mood and affect. Her behavior is normal. Judgmentand thought contentnormal  Assessment/Plan: 1. Functional deficits secondary to left pontine infarct which require 3+ hours per day of  interdisciplinary therapy in a comprehensive inpatient rehab setting. Physiatrist is providing close team supervision and 24 hour management of active medical problems listed below. Physiatrist and rehab team continue to assess barriers to discharge/monitor patient progress toward functional and medical goals.  Function:  Bathing Bathing position      Bathing parts      Bathing assist        Upper Body Dressing/Undressing Upper body dressing                    Upper body assist        Lower Body Dressing/Undressing Lower body dressing                                  Lower body assist        Toileting Toileting          Toileting assist     Transfers Chair/bed transfer             Locomotion Ambulation           Wheelchair          Cognition Comprehension Comprehension assist level: Understands basic 90% of the time/cues < 10% of the time  Expression Expression assist level: Expresses basic 90% of the time/requires cueing < 10% of the time.  Social Interaction Social Interaction assist level: Interacts appropriately 90% of the time - Needs monitoring or encouragement for participation or interaction.  Problem Solving Problem solving assist level: Solves basic 90% of the time/requires cueing < 10% of the time  Memory Memory assist level: Recognizes or recalls 90% of the time/requires cueing < 10% of the time     Medical Problem List and Plan: 1.  Right hemiplegia secondary to left pontine infarct             -Begin CIR 2.  DVT Prophylaxis/Anticoagulation: Subcutaneous Lovenox. Monitor platelet counts of any signs of bleeding 3. Pain Management: Hydrocodone as needed 4. Left humerus fracture 5 weeks ago after a fall. Shoulder sling, nonweightbearing. 5. Neuropsych: This patient is capable of making decisions on her own behalf. 6. Skin/Wound Care: Routine skin checks, pressure relief as appropriate 7. Fluids/Electrolytes/Nutrition:  Routine I&O  Cr. WNL on 10/20 8. Hyperlipidemia. Lipitor 9. Left humerus fracture 5 weeks ago after a fall. Nonweightbearing. Continue sling             -LUE elevation/compression to help control edema 10. HTN  Lisinopril 2.5 started on 10/21 11. Urinary retention  Foley in place, pt refusing d/c today, will start voiding trial soon  LOS (Days) 1 A FACE TO FACE EVALUATION WAS PERFORMED  Tia Gelb Lorie Phenix 06/27/2016 11:21 AM

## 2016-06-27 NOTE — Evaluation (Signed)
Occupational Therapy Assessment and Plan  Patient Details  Name: Melanie Hendricks MRN: [id number] Date of Birth: 02/06/1936  OT Diagnosis: abnormal posture, hemiplegia affecting dominant side, pain in joint and coordination disorder Rehab Potential: Rehab Potential (ACUTE ONLY): Good ELOS:  14-17 days   Today's Date: 06/27/2016 OT Individual Time: 6578-4696 and 1500-1602 OT Individual Time Calculation (min): 55 min and 62 min      Problem List: Patient Active Problem List   Diagnosis Date Noted  . Left pontine cerebrovascular accident ([place]) 06/26/2016  . Stroke (cerebrum) ([place]) 06/23/2016  . Blood pressure elevated without history of HTN 06/23/2016  . Left pontine stroke ([place]) 06/23/2016  . Stroke-like symptoms   . Eye movement disorder   . Closed fracture of neck of humerus with routine healing     Past Medical History:  Past Medical History:  Diagnosis Date  . Fx humeral neck   . Skin melanoma ([place])    Past Surgical History:  Past Surgical History:  Procedure Laterality Date  . TONSILLECTOMY      Assessment & Plan Clinical Impression: Patient is a 80 y.o. year old female with history of skin melanoma and recent left humerus fracture five weeks ago after fall with sling and NWB. Marland Kitchen Presented 06/23/2016 with slurred speech and right-sided weakness with facial droop. Per chart review patient lives with childrenindependent prior to admission.Family works during the day.One level home. 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 Patient did not receive TPA. Carotid Dopplers with no obvious ICA stenosis. Echocardiogram With ejection fraction of 60% and grade 1 diastolic dysfunction. EEG was no seizure activity. Neurology consulted currently on aspirin and Plavix for CVA prophylaxis. Subcutaneous Lovenox for DVT prophylaxis. Tolerating  a regular diet. Physical and occupational therapy evaluations completed 06/24/2016 with recommendations of physical medicine rehabilitation consult. .  Patient transferred to CIR on 06/26/2016 .    Patient currently requires max with basic self-care skills secondary to muscle weakness, decreased cardiorespiratoy endurance and decreased sitting balance, decreased standing balance, decreased postural control, hemiplegia, decreased balance strategies and difficulty maintaining precautions.  Prior to hospitalization, patient could complete ADLs and IADLs with independent .  Patient will benefit from skilled intervention to decrease level of assist with basic self-care skills prior to discharge home with care partner.  Anticipate patient will require intermittent supervision and minimal physical assistance and follow up outpatient.  OT - End of Session Activity Tolerance: Decreased this session Endurance Deficit: Yes Endurance Deficit Description: multiple rest breaks with functional tasks OT Assessment Rehab Potential (ACUTE ONLY): Good Barriers to Discharge: Other (comment) Barriers to Discharge Comments: none known at this time OT Patient demonstrates impairments in the following area(s): Balance;Vision;Cognition;Endurance;Motor;Pain;Safety OT Basic ADL's Functional Problem(s): Grooming;Bathing;Dressing;Toileting OT Advanced ADL's Functional Problem(s): Simple Meal Preparation;Laundry OT Transfers Functional Problem(s): Toilet;Tub/Shower OT Additional Impairment(s): None OT Plan OT Intensity: Minimum of 1-2 x/day, 45 to 90 minutes OT Frequency: 5 out of 7 days OT Duration/Estimated Length of Stay: 10-14 days OT Treatment/Interventions: Medical illustrator training;Community reintegration;Discharge planning;Functional mobility training;DME/adaptive equipment instruction;Therapeutic Activities;Psychosocial support;UE/LE Coordination activities;Patient/family education;Pain management;Neuromuscular  re-education;Self Care/advanced ADL retraining;Therapeutic Exercise;UE/LE Strength taining/ROM OT Self Feeding Anticipated Outcome(s): n/a OT Basic Self-Care Anticipated Outcome(s): mod I - supervision OT Toileting Anticipated Outcome(s): mod I  OT Bathroom Transfers Anticipated Outcome(s): Mod I - toilet, supervision - shower OT Recommendation Recommendations for Other Services: Other (comment) (none) Patient destination: Home Follow  Up Recommendations: Other (comment) (intermittent supervision) Equipment Recommended: To be determined   Skilled Therapeutic Intervention Session 1: Upon entering the room, pt supine in bed with daughter, Spring, present. Pt performed supine >sit with min A to EOB. Sit <>stand with mod lifting assistance. Pt with posterior lean in standing. Stand pivot transfer with mod A to wheelchair. Pt verbalizing need for BM and OT propelled wheelchair into bathroom. Stand pivot with use of armrest and grab bar with mod A. Pt having successful BM but needing assistance for hygiene and clothing management. OT educated pt and caregiver on OT purpose, POC, and goals with them verbalizing understanding and agreement. Pt returned to sit in recliner chair with call bell within reach.   Session 2: Upon entering the room, pt seated in recliner chair and transferred with mod A into wheelchair. OT propelled pt into bathroom for transfer onto TTB for bathing at shower level. OT provided education to pt regarding not using L UE at this time and to not weight bear during functional tasks as pt was pushing through L UE multiple times this session and needing verbal cues to not do so. Pt standing with lifting assistance for pt to wash buttocks. Standing balance with min A in shower. Pt returning to wheelchair and pt donning gown as she does not currently have clothing at this time. OT educated pt and caregiver on pt wearing sling during therapeutic intervention in order to insure safety, ensure  NWB,  and to decrease pain. Pt transferred back into recliner chair at end of session with call bell and all needed items within reach.   OT Evaluation Precautions/Restrictions  Precautions Precautions: Fall Restrictions Weight Bearing Restrictions: Yes LUE Weight Bearing: Non weight bearing Vital Signs Therapy Vitals Temp: 98.4 F (36.9 C) Temp Source: Oral Pulse Rate: 86 Resp: 17 BP: (!) 173/40 Patient Position (if appropriate): Lying Oxygen Therapy SpO2: 96 % O2 Device: Not Delivered Pain Pain Assessment Pain Assessment: 0-10 Pain Score: 0-No pain Home Living/Prior Functioning Home Living Available Help at Discharge: Family, Available PRN/intermittently Type of Home: House Home Access: Stairs to enter Technical brewer of Steps: 2 small steps Home Layout: One level Bathroom Shower/Tub: Chiropodist: Standard Bathroom Accessibility: Yes  Lives With: Son (and daughter in Sports [employer]) Prior Function Level of Independence: Independent with basic ADLs, Independent with homemaking with ambulation, Independent with transfers  Able to Take Stairs?: Yes Driving: Yes Vocation: Retired Vision/Perception  Vision- History Baseline Vision/History: Wears glasses Wears Glasses: At all times Patient Visual Report: Blurring of vision Vision- Assessment Vision Assessment?: Vision impaired- to be further tested in functional context  Cognition Overall Cognitive Status: Within Functional Limits for tasks assessed Arousal/Alertness: Awake/alert Orientation Level: Person;Place;Situation Person: Oriented Place: Oriented Situation: Oriented Year: 2017 Month: October Day of Week: Correct Memory: Appears intact Immediate Memory Recall: Sock;Blue;Bed Memory Recall: Sock;Blue;Bed Memory Recall Sock: Without Cue Memory Recall Blue: Without Cue Memory Recall Bed: Without Cue Sensation Sensation Light Touch: Impaired by gross assessment Stereognosis: Not  tested Hot/Cold: Not tested Proprioception: Appears Intact Coordination Gross Motor Movements are Fluid and Coordinated: No Fine Motor Movements are Fluid and Coordinated: No Mobility  Bed Mobility Bed Mobility: Supine to Sit Supine to Sit: 4: Min assist Transfers Transfers: Sit to Stand Sit to Stand: 3: Mod assist  Trunk/Postural Assessment  Cervical Assessment Cervical Assessment: Exceptions to [facility] (forward head) Thoracic Assessment Thoracic Assessment: Exceptions to [facility] (rounded shoulders) Lumbar Assessment Lumbar Assessment: Exceptions to [facility] (posterior pelvic tilt) Postural Control Postural  Control: Deficits on evaluation  Balance Balance Balance Assessed: Yes Dynamic Sitting Balance Dynamic Sitting - Balance Support: Feet supported;Right upper extremity supported Dynamic Sitting - Level of Assistance: 5: Stand by assistance Dynamic Standing Balance Dynamic Standing - Balance Support: No upper extremity supported;During functional activity Dynamic Standing - Level of Assistance: 3: Mod assist;2: Max assist Extremity/Trunk Assessment RUE Assessment RUE Assessment: Within Functional Limits (4/5 throughout) LUE Assessment LUE Assessment: Not tested (fx - NWB)   See Function Navigator for Current Functional Status.   Refer to Care Plan for Long Term Goals  Recommendations for other services: None  Discharge Criteria: Patient will be discharged from OT if patient refuses treatment 3 consecutive times without medical reason, if treatment goals not met, if there is a change in medical status, if patient makes no progress towards goals or if patient is discharged from hospital.  The above assessment, treatment plan, treatment alternatives and goals were discussed and mutually agreed upon: by patient and by family  Phineas Semen 06/27/2016, 8:46 AM

## 2016-06-28 DIAGNOSIS — G8918 Other acute postprocedural pain: Secondary | ICD-10-CM

## 2016-06-28 DIAGNOSIS — S42202S Unspecified fracture of upper end of left humerus, sequela: Secondary | ICD-10-CM

## 2016-06-28 MED ORDER — BISACODYL 10 MG RE SUPP
10.0000 mg | Freq: Every day | RECTAL | Status: DC | PRN
  Filled 2016-06-28: qty 1

## 2016-06-28 MED ORDER — LISINOPRIL 5 MG PO TABS
5.0000 mg | ORAL_TABLET | Freq: Every day | ORAL | Status: DC
  Administered 2016-06-29 – 2016-07-10 (×12): 5 mg via ORAL
  Filled 2016-06-28 (×12): qty 1

## 2016-06-28 MED ORDER — LISINOPRIL 2.5 MG PO TABS
2.5000 mg | ORAL_TABLET | Freq: Once | ORAL | Status: AC
  Administered 2016-06-28: 2.5 mg via ORAL
  Filled 2016-06-28: qty 1

## 2016-06-28 NOTE — Progress Notes (Signed)
[facility] PHYSICAL MEDICINE & REHABILITATION     PROGRESS NOTE  Subjective/Complaints:  Pt sitting up in bed this AM.  She states she feels she had a good day in therapies yesterday.  When informed pt about d/cing foley and risks of infection, pt's daughter states she wants to keep the foley in and that she has "heard this 37 times". She will consider removal later today.   ROS:  Stress incontinence.  Denies CP, SOB, N/V/D.  Objective: Vital Signs: Blood pressure (!) 161/45, pulse 90, temperature 98.6 F (37 C), temperature source Oral, resp. rate 17, height 5\' 6"  (1.676 m), weight 108.3 kg (238 lb 12.1 oz), SpO2 98 %. No results found.  Recent Labs  06/26/16 2040  WBC 7.9  HGB 13.1  HCT 39.7  PLT 265    Recent Labs  06/26/16 2040  CREATININE 0.77   CBG (last 3)   Recent Labs  06/26/16 0524  GLUCAP 99    Wt Readings from Last 3 Encounters:  06/26/16 108.3 kg (238 lb 12.1 oz)  06/24/16 109 kg (240 lb 6.4 oz)  05/14/16 90.7 kg (200 lb)    Physical Exam:  BP (!) 161/45 (BP Location: Right Arm)   Pulse 90   Temp 98.6 F (37 C) (Oral)   Resp 17   Ht 5\' 6"  (1.676 m)   Wt 108.3 kg (238 lb 12.1 oz)   SpO2 98%   BMI 38.54 kg/m  Constitutional: She appears well-developed. NAD. Vital signs reviewed. Eyes: EOMare normal. No discharge.  Cardiovascular: Normal rateand regular rhythm.  Respiratory: Effort normaland breath sounds normal. No respiratory distress.  GI: Soft. Bowel sounds are normal. She exhibits no distension.  Musculoskeletal: Edema and tenderness LUE. Neurological: She is alertand oriented.  Follows commands.  Good awareness of deficits.  Motor: 4/5 right biceps, triceps, wrist and HI  3/5 right HF, 3+KE and 4+ ADF/PF  LUE limited by pain, ~2+/5.  LLE 5/5 prox to distal.  Musculoskeletal: LUE edema. Skin: Skin is warm and dry.  Psychiatric: She has a normal mood and affect. Her behavior is normal. Judgmentand thought  contentnormal  Assessment/Plan: 1. Functional deficits secondary to left pontine infarct which require 3+ hours per day of interdisciplinary therapy in a comprehensive inpatient rehab setting. Physiatrist is providing close team supervision and 24 hour management of active medical problems listed below. Physiatrist and rehab team continue to assess barriers to discharge/monitor patient progress toward functional and medical goals.  Function:  Bathing Bathing position   Position: Shower  Bathing parts Body parts bathed by patient: Left arm, Chest, Abdomen, Front perineal area, Right upper leg, Left upper leg Body parts bathed by helper: Buttocks, Right arm, Right lower leg, Left lower leg, Back  Bathing assist Assist Level:  (mod A)      Upper Body Dressing/Undressing Upper body dressing   What is the patient wearing?: Hospital gown                Upper body assist Assist Level: Set up, Touching or steadying assistance(Pt > 75%)   Set up : To obtain clothing/put away  Lower Body Dressing/Undressing Lower body dressing   What is the patient wearing?: Non-skid slipper socks, Hospital Gown           Non-skid slipper socks- Performed by helper: Don/doff right sock, Don/doff left sock                  Lower body assist Assist for lower body dressing:  Touching or steadying assistance (Pt > 75%)      Toileting Toileting   Toileting steps completed by patient: Adjust clothing prior to toileting Toileting steps completed by helper: Performs perineal hygiene, Adjust clothing after toileting    Toileting assist Assist level:  (max A)   Transfers Chair/bed transfer   Chair/bed transfer method: Stand pivot Chair/bed transfer assist level: Moderate assist (Pt 50 - 74%/lift or lower) Chair/bed transfer assistive device: Armrests     Locomotion Ambulation     Max distance: 34ft Assist level: Maximal assist (Pt 25 - 49%)   Wheelchair   Type: Manual Max  wheelchair distance: 151ft  Assist Level: Touching or steadying assistance (Pt > 75%)  Cognition Comprehension Comprehension assist level: Follows basic conversation/direction with extra time/assistive device  Expression Expression assist level: Expresses basic 90% of the time/requires cueing < 10% of the time.  Social Interaction Social Interaction assist level: Interacts appropriately 90% of the time - Needs monitoring or encouragement for participation or interaction.  Problem Solving Problem solving assist level: Solves basic 90% of the time/requires cueing < 10% of the time  Memory Memory assist level: Recognizes or recalls 90% of the time/requires cueing < 10% of the time     Medical Problem List and Plan: 1.  Right hemiplegia secondary to left pontine infarct             -Cont CIR 2.  DVT Prophylaxis/Anticoagulation: Subcutaneous Lovenox. Monitor platelet counts of any signs of bleeding 3. Pain Management: Hydrocodone as needed 4. Left humerus fracture 5 weeks ago after a fall. Shoulder sling, nonweightbearing. 5. Neuropsych: This patient is capable of making decisions on her own behalf. 6. Skin/Wound Care: Routine skin checks, pressure relief as appropriate 7. Fluids/Electrolytes/Nutrition: Routine I&O  Cr. WNL on 10/20 8. Hyperlipidemia. Lipitor 9. Left humerus fracture 5 weeks ago after a fall. Nonweightbearing. Continue sling             -LUE elevation/compression to help control edema 10. HTN  Lisinopril 2.5 started on 10/21, increased on 10/22 11. Urinary retention  Pt's daughter refused d/c foley yesterday, today, pt's daughter states she will think about it later today, order placed to d/c foley  PVRs/bladder scan ordered  LOS (Days) 2 A FACE TO FACE EVALUATION WAS PERFORMED  Ankit Lorie Phenix 06/28/2016 9:46 AM

## 2016-06-28 NOTE — Plan of Care (Signed)
Problem: RH BLADDER ELIMINATION Goal: RH STG MANAGE BLADDER WITH EQUIPMENT WITH ASSISTANCE STG Manage Bladder With Equipment With Max Assistance  Outcome: Progressing Foley d/c today; pt voided

## 2016-06-29 ENCOUNTER — Inpatient Hospital Stay (HOSPITAL_COMMUNITY): Payer: Medicare Other

## 2016-06-29 ENCOUNTER — Inpatient Hospital Stay (HOSPITAL_COMMUNITY): Payer: Medicare Other | Admitting: Occupational Therapy

## 2016-06-29 LAB — COMPREHENSIVE METABOLIC PANEL
ALBUMIN: 3.7 g/dL (ref 3.5–5.0)
ALT: 31 U/L (ref 14–54)
ANION GAP: 10 (ref 5–15)
AST: 31 U/L (ref 15–41)
Alkaline Phosphatase: 63 U/L (ref 38–126)
BILIRUBIN TOTAL: 0.6 mg/dL (ref 0.3–1.2)
BUN: 13 mg/dL (ref 6–20)
CO2: 22 mmol/L (ref 22–32)
Calcium: 9.7 mg/dL (ref 8.9–10.3)
Chloride: 108 mmol/L (ref 101–111)
Creatinine, Ser: 0.89 mg/dL (ref 0.44–1.00)
GFR calc Af Amer: >60 mL/min (ref >60)
GFR calc non Af Amer: >60 mL/min (ref >60)
GLUCOSE: 176 mg/dL — AB (ref 65–99)
POTASSIUM: 3.7 mmol/L (ref 3.5–5.1)
Sodium: 140 mmol/L (ref 135–145)
TOTAL PROTEIN: 7.3 g/dL (ref 6.5–8.1)

## 2016-06-29 LAB — CBC WITH DIFFERENTIAL/PLATELET
BASOS PCT: 1 %
Basophils Absolute: 0 10*3/uL (ref 0.0–0.1)
EOS ABS: 0.3 10*3/uL (ref 0.0–0.7)
EOS PCT: 5 %
HEMATOCRIT: 42.1 % (ref 36.0–46.0)
Hemoglobin: 13.9 g/dL (ref 12.0–15.0)
Lymphocytes Relative: 30 %
Lymphs Abs: 1.7 10*3/uL (ref 0.7–4.0)
MCH: 30.5 pg (ref 26.0–34.0)
MCHC: 33 g/dL (ref 30.0–36.0)
MCV: 92.3 fL (ref 78.0–100.0)
MONO ABS: 0.4 10*3/uL (ref 0.1–1.0)
MONOS PCT: 7 %
NEUTROS ABS: 3.2 10*3/uL (ref 1.7–7.7)
Neutrophils Relative %: 57 %
Platelets: 299 10*3/uL (ref 150–400)
RBC: 4.56 MIL/uL (ref 3.87–5.11)
RDW: 12.6 % (ref 11.5–15.5)
WBC: 5.6 10*3/uL (ref 4.0–10.5)

## 2016-06-29 NOTE — Care Management Note (Signed)
[facility] Individual Statement of Services  Patient Name:  Melanie Hendricks  Date:  06/29/2016  Welcome to the [facility].  Our goal is to provide you with an individualized program based on your diagnosis and situation, designed to meet your specific needs.  With this comprehensive rehabilitation program, you will be expected to participate in at least 3 hours of rehabilitation therapies Monday-Friday, with modified therapy programming on the weekends.  Your rehabilitation program will include the following services:  Physical Therapy (PT), Occupational Therapy (OT), Speech Therapy (ST), 24 hour per day rehabilitation nursing, Therapeutic Recreaction (TR), Case Management (Social Worker), Rehabilitation Medicine, Nutrition Services and Pharmacy Services  Weekly team conferences will be held on Wednesday to discuss your progress.  Your Social Worker will talk with you frequently to get your input and to update you on team discussions.  Team conferences with you and your family in attendance may also be held.  Expected length of stay: 14-17 days  Overall anticipated outcome: supervision with cueing and min assist with ambulation  Depending on your progress and recovery, your program may change. Your Social Worker will coordinate services and will keep you informed of any changes. Your Social Worker's name and contact numbers are listed  below.  The following services may also be recommended but are not provided by the [facility] will be made to provide these services after discharge if needed.  Arrangements include referral to agencies that provide these services.  Your insurance has been verified to be:  [facility] Medicare Your primary doctor is:  None  Pertinent information will be shared with your doctor and your  insurance company.  Social Worker:  Ovidio Kin, [place] or ([phone]  Information discussed with and copy given to patient by: Elease Hashimoto, 06/29/2016, 9:21 AM

## 2016-06-29 NOTE — Progress Notes (Addendum)
Physical Therapy Session Note  Patient Details  Name: Melanie Hendricks MRN: [id number] Date of Birth: [birth year]  Today's Date: 06/29/2016 PT Individual Time: 0900-1000 ; [id number] PT Individual Time Calculation (min): 60 min , 41 min  Short Term Goals: Week 1:  PT Short Term Goal 1 (Week 1): patient will transfer with mod Assist to R and L consistently.  PT Short Term Goal 2 (Week 1): patient will performed sit<>stand with min Assist.  PT Short Term Goal 3 (Week 1): Patient will ambulate 67ft with mod Assist from PT.  PT Short Term Goal 4 (Week 1): Patient will perform WC mobility >124ft with supervision Assist.   Skilled Therapeutic Interventions/Progress Updates: dtr [place] attended therapy. tx 1:  W/c propulsion with  pillow behind back to assist with forward lean/feet on floor, RUE and bil LEs x 150' with supervision.  Stand pivot transfer to R with min assist, HW, mod cues for hand placement. Neuromuscular re-education for alternating reciprocal movements x 3 extremities, x 7 minutes at level 4, and scooting in/out of seat using wt shifting and reciprocal pelvic movements. Education regarding heads/hips relationship.  Gait with grocery cart, sling on LUE, NWBing LUE, with mod assist for steering cart, mod VCs for upright posture, R stance stabilty, forward gaze.  Pt left resting in w/c with all needs within reach.   tx 2:  Simulated car transfer with HW.  up/down curb with R rail.  Up/down 4 3" high steps, R rail x+1 to ascend step to method, +2 to descend step to method backwards, due to R knee buckling with each step. Pt exhausted; transferred back to bed and left resting in bed with alarm set, all needs within reach, and dtr present.      Therapy Documentation Precautions:  Precautions Precautions: Fall Restrictions Weight Bearing Restrictions: Yes LUE Weight Bearing: Non weight bearing   Pain: 2/10 L upper arm, premedicated AM; no pain PM    See Function Navigator for  Current Functional Status.   Therapy/Group: Individual Therapy  Chibuike Fleek 06/29/2016, 11:11 AM

## 2016-06-29 NOTE — Progress Notes (Signed)
Patient information reviewed and entered into eRehab system by Lanice Folden, RN, CRRN, PPS Coordinator.  Information including medical coding and functional independence measure will be reviewed and updated through discharge.     Per nursing patient was given "Data Collection Information Summary for Patients in Inpatient Rehabilitation Facilities with attached "Privacy Act Statement-Health Care Records" upon admission.  

## 2016-06-29 NOTE — Progress Notes (Signed)
Eugenia M Logue, RN Rehab Admission Coordinator Signed Physical Medicine and Rehabilitation  PMR Pre-admission Date of Service: 06/26/2016 10:43 AM  Related encounter: ED to Hosp-Admission (Discharged) from 06/23/2016 in [facility]       []Hide copied text PMR Admission Coordinator Pre-Admission Assessment  Patient: Melanie Hendricks is an 80 y.o., female MRN: [id number] DOB: 08/19/1936 Height: 5' 6" (167.6 cm) Weight: 109 kg (240 lb 6.4 oz)                                                                                                                                                  Insurance Information HMO: Yes    PPO:       PCP:       IPA:       80/20:       OTHER:   PRIMARY:  [employer] medicare      Policy#: [id number]      Subscriber: Marlen Osborn CM Name: Emily Davis      Phone#: [phone]       Fax#: [phone] (has EPIC access) Pre-Cert#: [id number]      Employer: Retired Benefits:  Phone #: [phone]     Name:  Online Eff. Date: 02/06/16     Deduct: $0      Out of Pocket Max: $4900 (met $399.00)      Life Max: None CIR: $345 days 1-5      SNF: $0 days 1-20; $160 days 21-51; $0 days 52-100 Outpatient: medical necessity     Co-Pay: $40 Home Health: 100%      Co-Pay: none DME: 80%     Co-Pay: 20% Providers: in network  Emergency Contact Information        Contact Information    Name Relation Home Work Mobile   Townshend,Drew Son   [phone]   Lamboy,Spring Daughter   [phone]   Tieu,Hannah Relative   [phone]     Current Medical History  Patient Admitting Diagnosis: L pontine infarct  History of Present Illness: A 80 y.o.right handed femalewith history of skin melanoma and recent left humerus fracture five weeks ago after fall with sling and NWB. . Presented 06/23/2016 with slurred speech and right-sided weakness with facial droop. Per chart review patient lives with childrenindependent prior to  admission. Family works during the day.One level home. 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 Patient did not receive TPA. Carotid Dopplers with no obvious ICA stenosis. Echocardiogram and EEG pending. Neurology consulted currently on aspirin and Plavix for CVA prophylaxis. Subcutaneous Lovenox for DVT prophylaxis. Tolerating a regular diet. Physical and occupational therapy evaluations completed 06/24/2016 with recommendations of physical medicine rehabilitation consult.    Total: 6=NIH  Past Medical   History      Past Medical History:  Diagnosis Date  . Fx humeral neck   . Skin melanoma (HCC)     Family History  family history includes Heart attack in her father; Heart failure in her brother; Seizures in her brother; Stomach cancer in her mother; Thyroid cancer in her father.  Prior Rehab/Hospitalizations: No previous rehab.  Suffered a three part left humerus fracture 5 weeks ago, no surgery, has a sling in place.  Has the patient had major surgery during 100 days prior to admission? No  Current Medications   Current Facility-Administered Medications:  .   stroke: mapping our early stages of recovery book, , Does not apply, Once, Xilin Niu, MD .  0.9 %  sodium chloride infusion, , Intravenous, Continuous, Xilin Niu, MD, Last Rate: 75 mL/hr at 06/24/16 1739, 75 mL at 06/24/16 1739 .  acetaminophen (TYLENOL) tablet 650 mg, 650 mg, Oral, Q6H PRN, Xilin Niu, MD, 650 mg at 06/26/16 1105 .  aspirin EC tablet 81 mg, 81 mg, Oral, Daily, Sharon L Biby, NP, 81 mg at 06/26/16 0914 .  atorvastatin (LIPITOR) tablet 40 mg, 40 mg, Oral, q1800, Sharon L Biby, NP, 40 mg at 06/25/16 1728 .  clopidogrel (PLAVIX) tablet 75 mg, 75 mg, Oral, Daily, Sharon L Biby, NP, 75 mg at 06/26/16 0914 .  enoxaparin (LOVENOX) injection 40 mg, 40 mg,  Subcutaneous, Q24H, Xilin Niu, MD, 40 mg at 06/26/16 0913 .  hydrALAZINE (APRESOLINE) injection 5 mg, 5 mg, Intravenous, Q2H PRN, Xilin Niu, MD .  HYDROcodone-acetaminophen (NORCO/VICODIN) 5-325 MG per tablet 1 tablet, 1 tablet, Oral, Q4H PRN, Xilin Niu, MD .  ondansetron (ZOFRAN) injection 4 mg, 4 mg, Intravenous, Q8H PRN, Xilin Niu, MD .  senna-docusate (Senokot-S) tablet 1 tablet, 1 tablet, Oral, QHS PRN, Xilin Niu, MD .  zolpidem (AMBIEN) tablet 5 mg, 5 mg, Oral, QHS PRN, Xilin Niu, MD  Patients Current Diet: Diet regular Room service appropriate? Yes; Fluid consistency: Thin Diet - low sodium heart healthy Diet - low sodium heart healthy  Precautions / Restrictions Precautions Precautions: Fall Restrictions Weight Bearing Restrictions: No LUE Weight Bearing: Non weight bearing Other Position/Activity Restrictions: No orders but kept pt NWB throughout session.   Has the patient had 2 or more falls or a fall with injury in the past year?Yes.  Had 1 fall which resulted in left humerus fracture 5 weeks ago.  Prior Activity Level Limited Community (1-2x/wk): Went out 1 X a week, was driving.  Home Assistive Devices / Equipment Home Assistive Devices/Equipment: None Home Equipment: None  Prior Device Use: Indicate devices/aids used by the patient prior to current illness, exacerbation or injury? None  Prior Functional Level Prior Function Level of Independence: Independent  Self Care: Did the patient need help bathing, dressing, using the toilet or eating?  Independent  Indoor Mobility: Did the patient need assistance with walking from room to room (with or without device)? Independent  Stairs: Did the patient need assistance with internal or external stairs (with or without device)? Independent  Functional Cognition: Did the patient need help planning regular tasks such as shopping or remembering to take medications? Independent  Current Functional  Level Cognition Arousal/Alertness: Awake/alert Overall Cognitive Status: Within Functional Limits for tasks assessed Orientation Level: Oriented X4 Attention: Sustained Sustained Attention: Appears intact Memory: Appears intact Awareness: Appears intact Problem Solving: Appears intact Safety/Judgment: Appears intact    Extremity Assessment (includes Sensation/Coordination) Upper Extremity Assessment: RUE deficits/detail, LUE deficits/detail RUE Deficits / Details:   Strength at least 3+/5. Poor gross/fine motor coordination. RUE Coordination: decreased fine motor, decreased gross motor LUE Deficits / Details: Pt able to wiggle fingers; limited evaluation due to immobilzation. LUE: Unable to fully assess due to immobilization  Lower Extremity Assessment: RLE deficits/detail, LLE deficits/detail RLE Deficits / Details: movement mildly uncoordinated, hip flexors 4-, quads 4, hams 4, df/pf 4-/5.  In standing, pt unable to control the knee in stance. RLE Coordination: decreased fine motor, decreased gross motor LLE Deficits / Details: 4+/5 strength grossly except 4/5 hip flexors, otherwise functional   ADLs Overall ADL's : Needs assistance/impaired Eating/Feeding Details (indicate cue type and reason): Reports daughter-in law has been feeding today. Encouraged self feeding. Grooming: Moderate assistance, Sitting Upper Body Bathing: Moderate assistance, Sitting Lower Body Bathing: Maximal assistance, +2 for physical assistance, Sit to/from stand Upper Body Dressing : Moderate assistance, Sitting Upper Body Dressing Details (indicate cue type and reason): to don sling Lower Body Dressing: Maximal assistance, Sit to/from stand, +2 for physical assistance Toilet Transfer: Minimal assistance, +2 for physical assistance, Cueing for safety, Cueing for sequencing, Stand-pivot Toilet Transfer Details (indicate cue type and reason): simulated with eob to recliner transfer.  dtr. in law will be main  caregiver so she assisted with transfer/family education.   Toileting- Clothing Manipulation and Hygiene: Maximal assistance, Sit to/from stand Toileting - Clothing Manipulation Details (indicate cue type and reason): simulated during transfer in room Functional mobility during ADLs: Minimal assistance, +2 for physical assistance General ADL Comments: dtr. and dtr. in law very engaged and active in assisting pt.     Mobility Overal bed mobility: Needs Assistance Bed Mobility: Supine to Sit Rolling: Max assist Sidelying to sit: Max assist, +2 for safety/equipment, +2 for physical assistance Supine to sit: Mod assist General bed mobility comments: Pt up in chair upon arrival   Transfers Overall transfer level: Needs assistance Equipment used: 1 person hand held assist Transfers: Sit to/from Stand Sit to Stand: +2 safety/equipment, Min assist Stand pivot transfers: Min guard, +2 physical assistance General transfer comment: cues for hand placement and scooting forward; assist to power up into standing and to steady upon stand   Ambulation / Gait / Stairs / Wheelchair Mobility Ambulation/Gait Ambulation/Gait assistance: Max assist, +2 physical assistance Ambulation Distance (Feet): 10 Feet Assistive device: 2 person hand held assist Gait Pattern/deviations: Step-to pattern, Decreased stance time - left, Decreased step length - right, Decreased step length - left, Decreased weight shift to right General Gait Details: cues for sequencing, posture, and weight shifting to midline; assist to weight shift, maintain balance, and advance L LE at times; pt fatigued and required mod +2 to return to sitting and recliner brought up to pt's legs by daughter   Posture / Balance Dynamic Sitting Balance Sitting balance - Comments: improved without assist and pt able to maintain midline orientation.  Scooted to EOB with guard assist  Balance Overall balance assessment: Needs assistance Sitting-balance support:  Single extremity supported Sitting balance-Leahy Scale: Fair Sitting balance - Comments: improved without assist and pt able to maintain midline orientation.  Scooted to EOB with guard assist  Postural control: Posterior lean, Right lateral lean Standing balance support: Single extremity supported Standing balance-Leahy Scale: Poor Standing balance comment: worked in standing at EOB on w/shift, midline balance, R knee control, stepping with R and L LE's  2 person assist, but for safety not as much physical assist   Special needs/care consideration BiPAP/CPAP No CPM No Continuous Drip IV 0.9% NS 75 mL/hr Dialysis   No      Life Vest No Oxygen No Special Bed No Trach Size No Wound Vac (area) No     Skin Had melanoma right arm and on left eye, treated by dermatologist                             Bowel mgmt: Last BM 06/23/16 Bladder mgmt: Foley catheter in place Diabetic mgmt No   Previous Home Environment Living Arrangements: Children  Lives With: Son (dtr-in-law) Available Help at Discharge: Family, Available PRN/intermittently Type of Home: House Home Layout: One level Home Access: Stairs to enter Entrance Stairs-Number of Steps: 2 small steps Bathroom Shower/Tub: Tub/shower unit Bathroom Toilet: Standard Home Care Services: No  Discharge Living Setting Plans for Discharge Living Setting: Patient's home, House, Lives with (comment) (Lives with son and daughter-in-law.) Type of Home at Discharge: Apartment Discharge Home Layout: One level Discharge Home Access: Stairs to enter Entrance Stairs-Number of Steps: 2 small steps entry Does the patient have any problems obtaining your medications?: No  Social/Family/Support Systems Patient Roles: Parent (Has son, dtr and dtr-in-law.) Contact Information: Drew Dakin - son - [phone] Anticipated Caregiver: son, dtr and dtr-in-law Anticipated Caregiver's Contact Information: Spring Selmer - [phone] Ability/Limitations  of Caregiver: Son and dtr-in-law work but can check on patient.  Dtr from [place] may stay with patient for a while after discharge home if needed.  She is currently visiting from [place] until 07/13/16. Caregiver Availability: Intermittent Discharge Plan Discussed with Primary Caregiver: Yes Is Caregiver In Agreement with Plan?: Yes Does Caregiver/Family have Issues with Lodging/Transportation while Pt is in Rehab?: No  Goals/Additional Needs Patient/Family Goal for Rehab: PT mod I, OT mod I and supervision goals Expected length of stay: 9-14 days Cultural Considerations: None Dietary Needs: Regular diet, thin liquids Equipment Needs: TBD Pt/Family Agrees to Admission and willing to participate: Yes Program Orientation Provided & Reviewed with Pt/Caregiver Including Roles  & Responsibilities: Yes  Decrease burden of Care through IP rehab admission: N/A  Possible need for SNF placement upon discharge: Not anticipated  Patient Condition: This patient's condition remains as documented in the consult dated 06/25/16, in which the Rehabilitation Physician determined and documented that the patient's condition is appropriate for intensive rehabilitative care in an inpatient rehabilitation facility. Will admit to inpatient rehab today.   Preadmission Screen Completed By:  Logue, Eugenia M, 06/26/2016 5:25 PM ______________________________________________________________________   Discussed status with Dr. Swartz on 06/26/16 at 1725 and received telephone approval for admission today.  Admission Coordinator:  Logue, Eugenia M, time1725/Date10/20/17       Cosigned by: Zachary T Swartz, MD at 06/26/2016 5:39 PM  Revision History                    

## 2016-06-29 NOTE — Progress Notes (Signed)
   PATIENT ID: Melanie Hendricks       Subjective: Patient is nearly 6 weeks out from left proximal humerus fracture treating conservatively in a sling. Admitted to ED 10/17 for "minor stroke". Now on rehab floor for R LE weakness. Reports no shoulder pain at rest/in sling, some discomfort with use in rehab. Has been trying to be non weight bearing. Some wrist discomfort increased since being in sling.  Objective:  Vitals:   06/29/16 0845 06/29/16 1409  BP: (!) 166/52 (!) 130/50  Pulse: (!) 102 91  Resp:  18  Temp:  97.3 F (36.3 C)     L UE nontender at fracture site, L wrist nontender to palpation L humerus moves as single unit in ROM testing   Labs:   Recent Labs  06/26/16 2040 06/29/16 0904  HGB 13.1 13.9   Recent Labs  06/26/16 2040 06/29/16 0904  WBC 7.9 5.6  RBC 4.31 4.56  HCT 39.7 42.1  PLT 265 299   Recent Labs  06/26/16 2040 06/29/16 0904  NA  --  140  K  --  3.7  CL  --  108  CO2  --  22  BUN  --  13  CREATININE 0.77 0.89  GLUCOSE  --  176*  CALCIUM  --  9.7    Assessment and Plan: Almost 6 wks s/p left prox humerus fracture Will put in xray orders today to eval for healing Can come out of sling, especially while in bed Will check on xrays tomorrow- if healed- can start with light weightbearing Will xray left write to rule out fracture although most likely secondary to sling use/strain from fall and slight stiffness

## 2016-06-29 NOTE — Progress Notes (Signed)
[place] PHYSICAL MEDICINE & REHABILITATION     PROGRESS NOTE  Subjective/Complaints:  Foley removed, Pt is voiding, discussed Left wrist pain with OT , and daughter as well as pt. Pt feels she likely injured wrist at same time as fall which caused humeral fx ~6wks ago.  Pain at dorsal wrist, at one point there was some redness and swelling but this has subsided  ROS:  Stress incontinence.  Denies CP, SOB, N/V/D.  Objective: Vital Signs: Blood pressure (!) 166/52, pulse (!) 102, temperature 97.9 F (36.6 C), temperature source Oral, resp. rate 18, height 5\' 6"  (1.676 m), weight 108.3 kg (238 lb 12.1 oz), SpO2 98 %. No results found.  Recent Labs  06/26/16 2040 06/29/16 0904  WBC 7.9 5.6  HGB 13.1 13.9  HCT 39.7 42.1  PLT 265 299    Recent Labs  06/26/16 2040 06/29/16 0904  NA  --  140  K  --  3.7  CL  --  108  GLUCOSE  --  176*  BUN  --  13  CREATININE 0.77 0.89  CALCIUM  --  9.7   CBG (last 3)  No results for input(s): GLUCAP in the last 72 hours.  Wt Readings from Last 3 Encounters:  06/26/16 108.3 kg (238 lb 12.1 oz)  06/24/16 109 kg (240 lb 6.4 oz)  05/14/16 90.7 kg (200 lb)    Physical Exam:  BP (!) 166/52 (BP Location: Right Arm)   Pulse (!) 102   Temp 97.9 F (36.6 C) (Oral)   Resp 18   Ht 5\' 6"  (1.676 m)   Wt 108.3 kg (238 lb 12.1 oz)   SpO2 98%   BMI 38.54 kg/m  Constitutional: She appears well-developed. NAD. Vital signs reviewed. Eyes: EOMare normal. No discharge.  Cardiovascular: Normal rateand regular rhythm.  Respiratory: Effort normaland breath sounds normal. No respiratory distress.  GI: Soft. Bowel sounds are normal. She exhibits no distension.  Musculoskeletal: Edema and tenderness LUE. Neurological: She is alertand oriented.  Follows commands.  Good awareness of deficits.  Motor: 4/5 right biceps, triceps, wrist and HI  4/5 right HF, 3+KE and 4+ ADF/PF  LUE limited by pain, ~2+/5.  LLE 5/5 prox to distal.   Musculoskeletal: LUE edema mild, left wrist has no erythema or jt effusion, dorsal pain Skin: Skin is warm and dry.  Psychiatric: She has a normal mood and affect. Her behavior is normal. Judgmentand thought contentnormal  Assessment/Plan: 1. Functional deficits secondary to left pontine infarct which require 3+ hours per day of interdisciplinary therapy in a comprehensive inpatient rehab setting. Physiatrist is providing close team supervision and 24 hour management of active medical problems listed below. Physiatrist and rehab team continue to assess barriers to discharge/monitor patient progress toward functional and medical goals.  Function:  Bathing Bathing position   Position: Shower  Bathing parts Body parts bathed by patient: Left arm, Chest, Abdomen, Front perineal area, Right upper leg, Left upper leg Body parts bathed by helper: Buttocks, Right arm, Right lower leg, Left lower leg, Back  Bathing assist Assist Level:  (mod A)      Upper Body Dressing/Undressing Upper body dressing   What is the patient wearing?: Hospital gown                Upper body assist Assist Level: Set up, Touching or steadying assistance(Pt > 75%)   Set up : To obtain clothing/put away  Lower Body Dressing/Undressing Lower body dressing   What is the  patient wearing?: Non-skid slipper socks, Hospital Gown           Non-skid slipper socks- Performed by helper: Don/doff right sock, Don/doff left sock                  Lower body assist Assist for lower body dressing: Touching or steadying assistance (Pt > 75%)      Toileting Toileting   Toileting steps completed by patient: Adjust clothing prior to toileting Toileting steps completed by helper: Performs perineal hygiene, Adjust clothing after toileting    Toileting assist Assist level:  (max A)   Transfers Chair/bed transfer   Chair/bed transfer method: Stand pivot Chair/bed transfer assist level: Moderate assist (Pt 50  - 74%/lift or lower) Chair/bed transfer assistive device: Armrests     Locomotion Ambulation     Max distance: 12ft Assist level: Maximal assist (Pt 25 - 49%)   Wheelchair   Type: Manual Max wheelchair distance: 140ft  Assist Level: Touching or steadying assistance (Pt > 75%)  Cognition Comprehension Comprehension assist level: Understands basic 90% of the time/cues < 10% of the time  Expression Expression assist level: Expresses basic 90% of the time/requires cueing < 10% of the time.  Social Interaction Social Interaction assist level: Interacts appropriately 90% of the time - Needs monitoring or encouragement for participation or interaction.  Problem Solving Problem solving assist level: Solves basic 90% of the time/requires cueing < 10% of the time  Memory Memory assist level: Recognizes or recalls 90% of the time/requires cueing < 10% of the time     Medical Problem List and Plan: 1.  Right hemiplegia secondary to left pontine infarct             -Cont CIR PT, OT 2.  DVT Prophylaxis/Anticoagulation: Subcutaneous Lovenox. Monitor platelet counts of any signs of bleeding 3. Pain Management: Hydrocodone as needed 4. Left humerus fracture 5 weeks ago after a fall. Shoulder sling, nonweightbearing. 5. Neuropsych: This patient is capable of making decisions on her own behalf. 6. Skin/Wound Care: Routine skin checks, pressure relief as appropriate 7. Fluids/Electrolytes/Nutrition: Routine I&O  Cr. WNL on 10/20 8. Hyperlipidemia. Lipitor 9. Left humerus fracture 5 weeks ago after a fall. Nonweightbearing. Continue sling             -LUE elevation/compression to help control edema, will reorder Humeral film on THurs if still in house and ask Ortho to review 10. HTN  Lisinopril 2.5 started on 10/21, increased on 10/23 to 5mg  Vitals:   06/29/16 0315 06/29/16 0845  BP: (!) 138/41 (!) 166/52  Pulse: 86 (!) 102  Resp: 18   Temp: 97.9 F (36.6 C)    11. Urinary  retention  resolved 12.  Left wrist pain- dorsal trauma ~5wks ago, check wrist films LOS (Days) 3 A FACE TO FACE EVALUATION WAS PERFORMED  Alysia Penna E 06/29/2016 10:56 AM

## 2016-06-29 NOTE — Progress Notes (Signed)
Occupational Therapy Session Note  Patient Details  Name: Melanie Hendricks MRN: [id number] Date of Birth: 05/19/1936  Today's Date: 06/29/2016 OT Individual Time: [id number] OT Individual Time Calculation (min): 43 min     Short Term Goals: Week 1:  OT Short Term Goal 1 (Week 1): Pt will demonstrate the ability to don and doff shoulder sling independently. OT Short Term Goal 2 (Week 1): Pt will perform shower transfer with min A in order to decrease level of assistance with self care.  OT Short Term Goal 3 (Week 1): Pt will perform LB dressing with mod A in order to decrease level of assist with self care.  OT Short Term Goal 4 (Week 1): Pt will perform demonstrate ability to maintain NWB on L UE without verbal cues for 2 consecutive sessions.    Skilled Therapeutic Interventions/Progress Updates:    Pt worked on functional transfers using the [employer].  Min assist for simulated tub transfer and transfer from recliner to wheelchair and back as well as transfer to regular bed.  Pt's daughter present as well.  Discussed expected DME needs of tub bench, as pt will be using a tub shower, as well 3:1 for use over the regular toilet.  Pt left in recliner at end of session with daughter present.    Therapy Documentation Precautions:  Precautions Precautions: Fall Required Braces or Orthoses: Sling Restrictions Weight Bearing Restrictions: Yes LUE Weight Bearing: Non weight bearing  Pain: Pain Assessment Pain Assessment: No/denies pain Faces Pain Scale: Hurts a little bit Pain Type: Acute pain Pain Location: Hand Pain Orientation: Left Pain Descriptors / Indicators: Discomfort Pain Onset: With Activity Pain Intervention(s): Repositioned Multiple Pain Sites: No ADL: See Function Navigator for Current Functional Status.   Therapy/Group: Individual Therapy  Zaelyn Noack OTR/L 06/29/2016, 2:06 PM

## 2016-06-29 NOTE — Progress Notes (Signed)
Social Work Assessment and Plan Social Work Assessment and Plan  Patient Details  Name: Melanie Hendricks MRN: [id number] Date of Birth: 1936/05/28  Today's Date: 06/29/2016  Problem List:  Patient Active Problem List   Diagnosis Date Noted  . Post-operative pain   . Benign essential HTN   . Urinary retention   . Closed fracture of left proximal humerus   . Left pontine cerebrovascular accident ([place]) 06/26/2016  . Stroke (cerebrum) ([place]) 06/23/2016  . Blood pressure elevated without history of HTN 06/23/2016  . Left pontine stroke ([place]) 06/23/2016  . Stroke-like symptoms   . Eye movement disorder   . Closed fracture of neck of humerus with routine healing    Past Medical History:  Past Medical History:  Diagnosis Date  . Fx humeral neck   . Skin melanoma ([place])    Past Surgical History:  Past Surgical History:  Procedure Laterality Date  . TONSILLECTOMY     Social History:  reports that she has never smoked. She has never used smokeless tobacco. She reports that she does not drink alcohol or use drugs.  Family / Support Systems Marital Status: Divorced Patient Roles: Parent Children: Stephens November  [id number]  Spring-daughter [phone] [place] here until 11/06 Other Supports: Hannah-daughter in-law [phone] Anticipated Caregiver: son and daughter in-law Ability/Limitations of Caregiver: Both work but can check on while working. Daughter from [place] is here until 11/06 Caregiver Availability: Evenings only Family Dynamics: Close knit with all three of her children-another son is in [place]. They will try to work out a plan to provide what pt needs, but do need to work. Pt is hoping she will get mod/i level and be self sufficent before leaving the rehab unit.  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    Abuse/Neglect Physical Abuse: Denies Verbal Abuse: Denies Sexual Abuse: Denies Exploitation of patient/patient's resources: Denies Self-Neglect: Denies  Emotional Status Pt's affect, behavior adn adjustment status: Pt is motivated to improve and regain her independence, she does not want to burden her children. She has always been independent and able to take care of herself and she is hopeful she can again. She has dealt with a broken arm before so she is adapted to this. She is getting more movement in her affected leg back which encourages her. Recent Psychosocial Issues: recently fractured humerous which she is NWB 5 weeks ago Pyschiatric History: No history deferred depression screen due to coping appropriately and able to verbalize her concerns and challenges. Will continue to montior and have neuro-psych see if team feels necessary. Pt is till adjusting to the rehab unit. Substance Abuse History: No issues  Patient / Family Perceptions, Expectations & Goals Pt/Family understanding of illness & functional limitations: Pt and daughter are able to explain her stroke and deficits. She does talk with the MD daily and feels her questions and concerns are being addressed. Her daughter has been staying with her for support. Premorbid pt/family roles/activities: Mom, retiree, friend, church member, etc Anticipated changes in roles/activities/participation: resume when able too Pt/family expectations/goals: Pt states: " I want to be able to take care of myself before I go home."  Daughter states: " I hope she does well she always has in the past, I will do what I can."  Community Resources [employer]: None Premorbid Home Care/DME Agencies: None Transportation available at discharge: Family Resource referrals  recommended: Support group (specify)  Discharge Planning Living Arrangements: Children Support Systems: Children, Water engineer, Other relatives, Social worker community Type of Residence: Private residence Insurance Resources: Multimedia programmer (specify) Primary school teacher) Financial Resources: Fish farm manager, Family Support Financial Screen Referred: No Living Expenses: Lives with family Money Management: Patient, Family Does the patient have any problems obtaining your medications?: No Home Management: Pateint helped with son and daughter in-law Patient/Family Preliminary Plans: Return home with son and daughter in-law and daughter will be here a short time before returning to [place]. Pt may require care, daughter is unsure what they will do if this is the case. Both son and daughter in-law work and can check on during the day but not be there, Social Work Anticipated Follow Up Needs: HH/OP, Support Group  Clinical Impression Pleasant female who is willing to work hard and recover from her stroke and fractured humerous. She feels she can adapt and achieve the goals she has set her sights on achieving while here. They do not have a plan [past 11/6 when daughter goes back to [place], pt will be alone during the day while son and daughter in-law work. Will see how pt progresses in therapies and come up with a safe discharge plan. Family is always used to pt being independent may need to see if daughter can stay longer or son or daughter in-law can take some time off.   Elease Hashimoto 06/29/2016, 1:27 PM

## 2016-06-29 NOTE — Progress Notes (Signed)
Occupational Therapy Session Note  Patient Details  Name: Melanie Hendricks MRN: [id number] Date of Birth: 1935/09/19  Today's Date: 06/29/2016 OT Individual Time: 1001-1101 OT Individual Time Calculation (min): 60 min     Short Term Goals: Week 1:  OT Short Term Goal 1 (Week 1): Pt will demonstrate the ability to don and doff shoulder sling independently. OT Short Term Goal 2 (Week 1): Pt will perform shower transfer with min A in order to decrease level of assistance with self care.  OT Short Term Goal 3 (Week 1): Pt will perform LB dressing with mod A in order to decrease level of assist with self care.  OT Short Term Goal 4 (Week 1): Pt will perform demonstrate ability to maintain NWB on L UE without verbal cues for 2 consecutive sessions.    Skilled Therapeutic Interventions/Progress Updates:    Pt completed shower and dressing during session.  Mod instructional cueing to refrain from using the LUE for washing the right upper arm and for attempting to hold the grab bar in the shower or the sink with the LUE when washing peri area or drying as well as when attempting to pull underpants and pants over hips.  She was able to donn button up shirt but not able to donn her underpants without min assist for threading the RLE and for pulling up over hips on the left side.  She did don her skirt over her feet but also needed assist to pull them up on the left side.  Therapist assisted with TEDs and shoes secondary to decreased time.  Pt left in wheelchair with daughter spring to assist with fixing her hair.   Therapy Documentation Precautions:  Precautions Precautions: Fall Required Braces or Orthoses: Sling Restrictions Weight Bearing Restrictions: Yes LUE Weight Bearing: Non weight bearing  Pain: Pain Assessment Pain Assessment: Faces Faces Pain Scale: Hurts a little bit Pain Type: Acute pain Pain Location: Hand Pain Orientation: Left Pain Descriptors / Indicators: Discomfort Pain  Onset: With Activity Pain Intervention(s): Repositioned Multiple Pain Sites: No ADL: See Function Navigator for Current Functional Status.   Therapy/Group: Individual Therapy  Etoy Mcdonnell OTR/L 06/29/2016, 12:39 PM

## 2016-06-29 NOTE — Progress Notes (Signed)
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 right handed female with history of skin melanoma and recent left humerus fracture five weeks ago after fall with sling and NWB.   Marland Kitchen Presented 06/23/2016 with slurred speech and right-sided weakness with facial droop. Per chart review patient lives with children independent prior to admission.Family works during the day. One level home. 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 Patient did not receive TPA. Carotid Dopplers with no obvious ICA stenosis. Echocardiogram and EEG pending. Neurology consulted currently on aspirin and Plavix for CVA prophylaxis. Subcutaneous Lovenox for DVT prophylaxis. Tolerating a regular diet. Physical and occupational therapy evaluations completed 06/24/2016 with recommendations of physical medicine rehabilitation consult.   Review of Systems  Constitutional: Negative for chills and fever.  HENT: Negative for hearing loss.   Eyes: Negative for blurred vision and double vision.  Respiratory: Negative for cough and shortness of breath.   Cardiovascular: Negative for chest pain, palpitations and leg swelling.  Gastrointestinal: Positive for constipation. Negative for nausea and vomiting.  Genitourinary: Negative for dysuria and hematuria.  Musculoskeletal: Positive for myalgias.    Skin: Negative for rash.  Neurological: Positive for sensory change, speech change and weakness. Negative for headaches.  All other systems reviewed and are negative.      Past Medical History:  Diagnosis Date  . Fx humeral neck   . Skin melanoma ([place])         Past Surgical History:  Procedure Laterality Date  . TONSILLECTOMY          Family History  Problem Relation Age of Onset  . Stomach cancer Mother   . Thyroid cancer Father   . Heart attack Father   . Seizures Brother   . Heart failure Brother    Social History:  reports that she has never smoked. She has never used smokeless tobacco. She reports that she does not drink alcohol or use drugs. Allergies: No Known Allergies       Medications Prior to Admission  Medication Sig Dispense Refill  . acetaminophen (TYLENOL) 325 MG tablet Take 325 mg by mouth every 6 (six) hours as needed for mild pain or headache.    Marland Kitchen aspirin EC 325 MG tablet Take 325 mg by mouth daily as needed for moderate pain.    Marland Kitchen HYDROcodone-acetaminophen (NORCO/VICODIN) 5-325 MG tablet Take 1 tablet by mouth every 4 (four) hours as needed. 15 tablet 0  . ondansetron (ZOFRAN ODT) 4 MG disintegrating tablet Take 1 tablet (4 mg total) by mouth every 8 (eight) hours as needed for nausea or vomiting. 20 tablet 0    Home: Home Living Family/patient expects to be discharged to:: Private residence Living Arrangements: Children Available Help at Discharge: Family, Available PRN/intermittently Type of Home: House Home Layout: One level Bathroom Shower/Tub: Chiropodist: Standard Home Equipment: None  Lives With: Son (dtr-in-law)  Functional History: Prior Function Level of  Independence: Independent Functional Status:  Mobility: Bed Mobility Overal bed mobility: Needs Assistance, +2 for physical assistance Bed Mobility: Rolling, Sidelying to Sit Rolling: Max assist Sidelying to sit: Max assist, +2 for physical  assistance Transfers Overall transfer level: Needs assistance Equipment used: 2 person hand held assist Transfers: Sit to/from Stand, Stand Pivot Transfers Sit to Stand: Max assist, +2 physical assistance Stand pivot transfers: Max assist, +2 physical assistance General transfer comment: Strong posterior lean in standing. Ambulation/Gait General Gait Details: not able    ADL: ADL Overall ADL's : Needs assistance/impaired Eating/Feeding Details (indicate cue type and reason): Reports daughter-in law has been feeding today. Encouraged self feeding. Grooming: Moderate assistance, Sitting Upper Body Bathing: Moderate assistance, Sitting Lower Body Bathing: Maximal assistance, +2 for physical assistance, Sit to/from stand Upper Body Dressing : Moderate assistance, Sitting Upper Body Dressing Details (indicate cue type and reason): to don sling Lower Body Dressing: Maximal assistance, Sit to/from stand, +2 for physical assistance Toilet Transfer: Maximal assistance, +2 for physical assistance, Stand-pivot, BSC Toilet Transfer Details (indicate cue type and reason): Simulated by stand pivot from bed to chair Functional mobility during ADLs: Maximal assistance, +2 for physical assistance (for stand pivot only)  Cognition: Cognition Overall Cognitive Status: Within Functional Limits for tasks assessed Arousal/Alertness: Awake/alert Orientation Level: Oriented X4 Attention: Sustained Sustained Attention: Appears intact Memory: Appears intact Awareness: Appears intact Problem Solving: Appears intact Safety/Judgment: Appears intact Cognition Arousal/Alertness: Awake/alert Behavior During Therapy: WFL for tasks assessed/performed, Flat affect Overall Cognitive Status: Within Functional Limits for tasks assessed  Blood pressure (!) 160/64, pulse 98, temperature 98.6 F (37 C), temperature source Oral, resp. rate 18, height 5\' 6"  (1.676 m), weight 109 kg (240 lb 6.4 oz), SpO2 98  %. Physical Exam  Constitutional: She is oriented to person, place, and time. She appears well-developed.  Eyes: EOM are normal.  Neck: Normal range of motion. Neck supple. Thyromegaly present.  Cardiovascular: Normal rate and regular rhythm.   Respiratory: Effort normal and breath sounds normal. No respiratory distress.  GI: Soft. Bowel sounds are normal. She exhibits no distension.  Musculoskeletal:  LUE in sling  Neurological: She is alert and oriented to person, place, and time.  Follows commands .Good awareness of deficits. No dysarthria or CN findings. Mild right hemiparesis 4/5 RUE and 3 HF, 3+KE and 4+ ADF/PF with decreased Gardner upper and lower extremity. LUE limited by ortho/sling. LLE 5/5 prox to distal. Cognitively intact  Skin: Skin is warm.  Psychiatric: She has a normal mood and affect. Her behavior is normal. Judgment and thought content normal.    Lab Results Last 24 Hours       Results for orders placed or performed during the hospital encounter of 06/23/16 (from the past 24 hour(s))  Glucose, capillary     Status: Abnormal   Collection Time: 06/24/16 11:37 AM  Result Value Ref Range   Glucose-Capillary 118 (H) 65 - 99 mg/dL  Glucose, capillary     Status: Abnormal   Collection Time: 06/25/16  6:05 AM  Result Value Ref Range   Glucose-Capillary 100 (H) 65 - 99 mg/dL   Comment 1 Notify RN    Comment 2 Document in Chart       Imaging Results (Last 48 hours)  Mr Brain Wo Contrast  Result Date: 06/24/2016 CLINICAL DATA:  Initial evaluation for slurred speech. Left-sided numbness. EXAM: MRI HEAD WITHOUT CONTRAST MRA HEAD WITHOUT CONTRAST TECHNIQUE: Multiplanar, multiecho pulse sequences of the brain and surrounding structures were obtained without  intravenous contrast. Angiographic images of the head were obtained using MRA technique without contrast. COMPARISON:  Prior CT from earlier the same day. FINDINGS: MRI HEAD FINDINGS Brain: Cerebral volume within  normal limits for age. Patchy and confluent T2/FLAIR hyperintensity within the periventricular and deep white matter both cerebral hemispheres with, most consistent with chronic microvascular ischemic disease. This is fairly mild for age. There is patchy diffusion abnormality within the left paramedian pons, compatible with acute ischemic infarct (series 5, image 16). No associated mass effect or hemorrhage. No other acute infarct identified. Gray-white matter differentiation otherwise maintained. No evidence for acute or chronic intracranial hemorrhage. The no other areas of chronic infarction identified. No mass lesion, midline shift, or mass effect. No hydrocephalus. No extra-axial fluid collection. Major dural sinuses are grossly patent. Vascular: Abnormal flow void within the left vertebral artery and basilar artery, which may be occluded. Major intracranial vascular flow voids otherwise maintained. Skull and upper cervical spine: Craniocervical junction within normal limits. Visualized upper cervical spine unremarkable. Osseous mineralization normal. No acute scalp soft tissue abnormality. Sinuses/Orbits: Globes and orbital soft tissues within normal limits. Mild scattered mucosal thickening within the ethmoidal air cells. Paranasal sinuses are otherwise clear. Trace opacity within the mastoid air cells bilaterally. Inner ear structures within normal limits. MRA HEAD FINDINGS ANTERIOR CIRCULATION: Distal cervical segments of the internal carotid arteries are patent with antegrade flow. Petrous, cavernous, and supraclinoid segments patent without flow-limiting stenosis. A1 segments patent. Anterior communicating artery within normal limits. Anterior cerebral arteries well opacified to their distal aspects. M1 segments patent without stenosis or occlusion. MCA bifurcations within normal limits. Distal MCA branches well perfused and symmetric. POSTERIOR CIRCULATION: Right vertebral artery patent to the  vertebrobasilar junction. Right posterior inferior cerebral artery patent proximally. Left vertebral artery is not visualize, likely occluded. There is apparent occlusion of the basilar artery at the level of the vertebrobasilar junction. There is some patchy distal reconstitution (series 4, image 75). Superior cerebral arteries are patent distally. P1 segments are hypoplastic bilaterally, particularly on the right, with prominent bilateral posterior communicating arteries. PCAs are well perfused and opacified to their distal aspects. No aneurysm or vascular malformation. IMPRESSION: MRI HEAD IMPRESSION: 1. 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 2. Mild chronic microvascular ischemic disease. MRA HEAD IMPRESSION: 1. Nonvisualization of the left vertebral artery and majority of the basilar artery, likely occluded. Right vertebral artery patent to the vertebrobasilar junction. PCAs are well perfused, predominantly fetal type in nature and supplied via prominent posterior communicating arteries. 2. Widely patent anterior circulation. Results were called by telephone at the time of interpretation on 06/24/2016 at 12:24 am to Dr. Nicole Kindred, who verbally acknowledged these results. Electronically Signed   By: Jeannine Boga M.D.   On: 06/24/2016 00:30   Mr Jodene Nam Head/brain [id number] Cm  Result Date: 06/24/2016 CLINICAL DATA:  Initial evaluation for slurred speech. Left-sided numbness. EXAM: MRI HEAD WITHOUT CONTRAST MRA HEAD WITHOUT CONTRAST TECHNIQUE: Multiplanar, multiecho pulse sequences of the brain and surrounding structures were obtained without intravenous contrast. Angiographic images of the head were obtained using MRA technique without contrast. COMPARISON:  Prior CT from earlier the same day. FINDINGS: MRI HEAD FINDINGS Brain: Cerebral volume within normal limits for age. Patchy and confluent T2/FLAIR hyperintensity within the periventricular and deep white matter both cerebral  hemispheres with, most consistent with chronic microvascular ischemic disease. This is fairly mild for age. There is patchy diffusion abnormality within the left paramedian pons, compatible with acute ischemic infarct (series 5, image  16). No associated mass effect or hemorrhage. No other acute infarct identified. Gray-white matter differentiation otherwise maintained. No evidence for acute or chronic intracranial hemorrhage. The no other areas of chronic infarction identified. No mass lesion, midline shift, or mass effect. No hydrocephalus. No extra-axial fluid collection. Major dural sinuses are grossly patent. Vascular: Abnormal flow void within the left vertebral artery and basilar artery, which may be occluded. Major intracranial vascular flow voids otherwise maintained. Skull and upper cervical spine: Craniocervical junction within normal limits. Visualized upper cervical spine unremarkable. Osseous mineralization normal. No acute scalp soft tissue abnormality. Sinuses/Orbits: Globes and orbital soft tissues within normal limits. Mild scattered mucosal thickening within the ethmoidal air cells. Paranasal sinuses are otherwise clear. Trace opacity within the mastoid air cells bilaterally. Inner ear structures within normal limits. MRA HEAD FINDINGS ANTERIOR CIRCULATION: Distal cervical segments of the internal carotid arteries are patent with antegrade flow. Petrous, cavernous, and supraclinoid segments patent without flow-limiting stenosis. A1 segments patent. Anterior communicating artery within normal limits. Anterior cerebral arteries well opacified to their distal aspects. M1 segments patent without stenosis or occlusion. MCA bifurcations within normal limits. Distal MCA branches well perfused and symmetric. POSTERIOR CIRCULATION: Right vertebral artery patent to the vertebrobasilar junction. Right posterior inferior cerebral artery patent proximally. Left vertebral artery is not visualize, likely  occluded. There is apparent occlusion of the basilar artery at the level of the vertebrobasilar junction. There is some patchy distal reconstitution (series 4, image 75). Superior cerebral arteries are patent distally. P1 segments are hypoplastic bilaterally, particularly on the right, with prominent bilateral posterior communicating arteries. PCAs are well perfused and opacified to their distal aspects. No aneurysm or vascular malformation. IMPRESSION: MRI HEAD IMPRESSION: 1. 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 2. Mild chronic microvascular ischemic disease. MRA HEAD IMPRESSION: 1. Nonvisualization of the left vertebral artery and majority of the basilar artery, likely occluded. Right vertebral artery patent to the vertebrobasilar junction. PCAs are well perfused, predominantly fetal type in nature and supplied via prominent posterior communicating arteries. 2. Widely patent anterior circulation. Results were called by telephone at the time of interpretation on 06/24/2016 at 12:24 am to Dr. Nicole Kindred, who verbally acknowledged these results. Electronically Signed   By: Jeannine Boga M.D.   On: 06/24/2016 00:30   Ct Head Code Stroke W/o Cm  Result Date: 06/23/2016 CLINICAL DATA:  Code stroke. Left-sided weakness, slurred speech and facial droop EXAM: CT HEAD WITHOUT CONTRAST TECHNIQUE: Contiguous axial images were obtained from the base of the skull through the vertex without intravenous contrast. COMPARISON:  None. FINDINGS: Brain: There is no acute hemorrhage. No evidence of acute cortical infarct. No midline shift or mass effect. There is periventricular hypoattenuation compatible with chronic microvascular disease. No hydrocephalus. Vascular: No hyperdense vessel sign. Skull: Normal. Negative for fracture or focal lesion. Sinuses/Orbits: No fluid levels or advanced mucosal thickening. Normal orbits. Other: Visualized extracranial soft tissues are normal. ASPECTS ([name]  Stroke Program Early CT Score) - Ganglionic level infarction (caudate, lentiform nuclei, internal capsule, insula, M1-M3 cortex): 7 - Supraganglionic infarction (M4-M6 cortex): 3 Total score (0-10 with 10 being normal): 10 IMPRESSION: 1. No acute intracranial abnormality. 2. ASPECTS is 10. These results were called by telephone at the time of interpretation on 06/23/2016 at 9:12 pm to [place], PA, who verbally acknowledged these results. Electronically Signed   By: Ulyses Jarred M.D.   On: 06/23/2016 21:12     Assessment/Plan: Diagnosis: left pontine infarct, recent left humeral fracture 1. Does the  need for close, 24 hr/day medical supervision in concert with the patient's rehab needs make it unreasonable for this patient to be served in a less intensive setting? Yes 2. Co-Morbidities requiring supervision/potential complications: HTN 3. Due to bladder management, bowel management, safety, skin/wound care, disease management, medication administration, pain management and patient education, does the patient require 24 hr/day rehab nursing? Yes 4. Does the patient require coordinated care of a physician, rehab nurse, PT (1-2 hrs/day, 5 days/week) and OT (1-2 hrs/day, 5 days/week) to address physical and functional deficits in the context of the above medical diagnosis(es)? Yes Addressing deficits in the following areas: balance, endurance, locomotion, strength, transferring, bowel/bladder control, bathing, dressing, feeding, grooming, toileting and psychosocial support 5. Can the patient actively participate in an intensive therapy program of at least 3 hrs of therapy per day at least 5 days per week? Yes 6. The potential for patient to make measurable gains while on inpatient rehab is excellent 7. Anticipated functional outcomes upon discharge from inpatient rehab are modified independent  with PT, modified independent and supervision with OT, n/a with SLP. 8. Estimated rehab length of stay to  reach the above functional goals is: 9-14 days 9. Does the patient have adequate social supports and living environment to accommodate these discharge functional goals? Yes 10. Anticipated D/C setting: Home 11. Anticipated post D/C treatments: HH therapy and Outpatient therapy 12. Overall Rehab/Functional Prognosis: excellent  RECOMMENDATIONS: This patient's condition is appropriate for continued rehabilitative care in the following setting: CIR Patient has agreed to participate in recommended program. Yes Note that insurance prior authorization may be required for reimbursement for recommended care.  Comment: Rehab Admissions Coordinator to follow up.  Thanks,  Melanie Staggers, MD, [facility]     06/25/2016    Revision History                        Routing History

## 2016-06-29 NOTE — IPOC Note (Addendum)
Overall Plan of Care [facility]) Patient Details Name: Melanie Hendricks MRN: [id number] DOB: August 16, 1936  Admitting Diagnosis: Stroke  Hospital Problems: Principal Problem:   Left pontine cerebrovascular accident [facility]) Active Problems:   Benign essential HTN   Urinary retention   Closed fracture of left proximal humerus   Post-operative pain     Functional Problem List: Nursing Bladder, Bowel, Endurance, Medication Management, Motor, Pain, Safety, Skin Integrity  PT Balance, Motor, Endurance, Safety  OT Balance, Vision, Cognition, Endurance, Motor, Pain, Safety  SLP    TR         Basic ADL's: OT Grooming, Bathing, Dressing, Toileting     Advanced  ADL's: OT Simple Meal Preparation, Laundry     Transfers: PT Bed Mobility, Bed to Chair, Car, [employer], Futures trader, Metallurgist: PT Ambulation, Emergency planning/management officer, Stairs     Additional Impairments: OT None  SLP        TR      Anticipated Outcomes Item Anticipated Outcome  Self Feeding n/a  Swallowing      Basic self-care  mod I - min A  Toileting  supervision   Bathroom Transfers supervision  Bowel/Bladder  Manage with MOD I assistance  Transfers  Supervision Assist with LRAD  Locomotion  Min Assist-SUppervision assist with LRAD   Communication     Cognition     Pain  Pain less than 3  Safety/Judgment  Remain free from skin breakdown while on Rehab   Therapy Plan: PT Intensity: Minimum of 1-2 x/day ,45 to 90 minutes PT Frequency: 5 out of 7 days PT Duration Estimated Length of Stay: 14-17 days  OT Intensity: Minimum of 1-2 x/day, 45 to 90 minutes OT Frequency: 5 out of 7 days OT Duration/Estimated Length of Stay: 14-17 days         Team Interventions: Nursing Interventions Patient/Family Education, Bladder Management, Bowel Management, Disease Management/Prevention, Pain Management, Medication Management, Skin Care/Wound Management, Discharge Planning  PT interventions  Ambulation/gait training, Balance/vestibular training, Community reintegration, Discharge planning, Disease management/prevention, DME/adaptive equipment instruction, Functional mobility training, Neuromuscular re-education, Pain management, Patient/family education, Psychosocial support, Skin care/wound management, Splinting/orthotics, Therapeutic Exercise, Therapeutic Activities, Stair training, Wheelchair propulsion/positioning, Visual/perceptual remediation/compensation, UE/LE Coordination activities, UE/LE Strength taining/ROM  OT Interventions Training and development officer, Academic librarian, Discharge planning, Functional mobility training, DME/adaptive equipment instruction, Therapeutic Activities, Psychosocial support, UE/LE Coordination activities, Patient/family education, Pain management, Neuromuscular re-education, Self Care/advanced ADL retraining, Therapeutic Exercise, UE/LE Strength taining/ROM  SLP Interventions    TR Interventions    SW/CM Interventions  Pt/Family Education, Psychosocial Assessment & Discharge Planning    Team Discharge Planning: Destination: PT-Home ,OT- Home , SLP-  Projected Follow-up: PT-Home health PT, OT-  Other (comment) (intermittent supervision), SLP-  Projected Equipment Needs: PT-Wheelchair (measurements), Wheelchair cushion (measurements), Quad cane, To be determined, OT- To be determined, SLP-  Equipment Details: PT- , OT-  Patient/family involved in discharge planning: PT- Patient, Family member/caregiver,  OT-Patient, Family member/caregiver, SLP-   MD ELOS: 10-14d Medical Rehab Prognosis:  Good Assessment: 80 y.o.right handed femalewith history of skin melanoma and recent left humerus fracture five weeks ago after fall with sling and NWB. Marland Kitchen Presented 06/23/2016 with slurred speech and right-sided weakness with facial droop. Per chart review patient lives with childrenindependent prior to admission.Family works during the day.One level home.  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 Patient  did not receive TPA. Carotid Dopplers with no obvious ICA stenosis. Echocardiogram With ejection fraction of 60% and grade 1 diastolic dysfunction. EEG was no seizure activity. Neurology consulted currently on aspirin and Plavix for CVA prophylaxis   Now requiring 24/7 Rehab RN,MD, as well as CIR level PT, OT and SLP.  Treatment team will focus on ADLs and mobility with goals set at Sup

## 2016-06-30 ENCOUNTER — Inpatient Hospital Stay (HOSPITAL_COMMUNITY): Payer: Medicare Other | Admitting: Occupational Therapy

## 2016-06-30 ENCOUNTER — Inpatient Hospital Stay (HOSPITAL_COMMUNITY): Payer: Medicare Other

## 2016-06-30 ENCOUNTER — Inpatient Hospital Stay (HOSPITAL_COMMUNITY): Payer: Medicare Other | Admitting: Physical Therapy

## 2016-06-30 NOTE — Progress Notes (Signed)
Physical Therapy Session Note  Patient Details  Name: Melanie Hendricks MRN: [id number] Date of Birth: Dec 04, 1935  Today's Date: 06/30/2016 PT Individual Time: [id number] PT Individual Time Calculation (min): 54 min    Short Term Goals: Week 1:  PT Short Term Goal 1 (Week 1): patient will transfer with mod Assist to R and L consistently.  PT Short Term Goal 2 (Week 1): patient will performed sit<>stand with min Assist.  PT Short Term Goal 3 (Week 1): Patient will ambulate 30ft with mod Assist from PT.  PT Short Term Goal 4 (Week 1): Patient will perform WC mobility >151ft with supervision Assist.   Skilled Therapeutic Interventions/Progress Updates:    Pt received in w/c & agreeable to tx. Pt noted 5/10 LUE pain with movement but denied pain at rest; pt premedicated. Pt's daughter-in-law, Jarrett Soho, present for session. Pt propelled w/c x 100 ft with BUE for strengthening; therapist provided cuing for technique with fair demo by pt. Pt completed car transfer from lowest simulated seat height with cuing for hand placement, RW & steady assist overall. Jarrett Soho reports pt will be transported home in her car, which is compact & low to the ground; requested she measure seat height of her car. Jarrett Soho requested to be checked off to assist pt to bathroom. In room, Jarrett Soho was able to manage w/c parts with instructional cuing, transport pt in/out of bathroom in w/c, and provide steady assist as pt used grab bar to stand pivot w/c<>toilet and manage clothing; pt with continent void. Safety plan updated to reflect Jarrett Soho is now checked off to assist pt with stand pivot transfers w/c<>toilet and RN made aware. Gait training with RW x 150 ft + 150 ft with min assist and max cuing for increased heel strike with fair demo by pt. Pt ambulates with increased gait speed, decreased foot clearance RLE, minimal heel strike BLE and foot slap. Provided demonstration for pt's current versus normalized gait pattern. Utilized cybex  kinetron in sitting up to 20 cm/sec focusing on BLE neuro re-ed & strengthening. At end of session pt left sitting in w/c with Jarrett Soho present, all needs within reach & set up with meal tray.   Therapy Documentation Precautions:  Precautions Precautions: Fall Precaution Comments: d/c sling LUE 10/24 Required Braces or Orthoses: Sling Restrictions Weight Bearing Restrictions: No LUE Weight Bearing: Weight bearing as tolerated Other Position/Activity Restrictions: D/c sling and WBAT and ROM 10/24   See Function Navigator for Current Functional Status.   Therapy/Group: Individual Therapy  Waunita Schooner 06/30/2016, 5:09 PM

## 2016-06-30 NOTE — Progress Notes (Signed)
Occupational Therapy Session Note  Patient Details  Name: Melanie Hendricks MRN: [id number] Date of Birth: 12-29-1935  Today's Date: 06/30/2016 OT Individual Time: [id number] OT Individual Time Calculation (min): 62 min     Short Term Goals: Week 1:  OT Short Term Goal 1 (Week 1): Pt will demonstrate the ability to don and doff shoulder sling independently. OT Short Term Goal 2 (Week 1): Pt will perform shower transfer with min A in order to decrease level of assistance with self care.  OT Short Term Goal 3 (Week 1): Pt will perform LB dressing with mod A in order to decrease level of assist with self care.  OT Short Term Goal 4 (Week 1): Pt will perform demonstrate ability to maintain NWB on L UE without verbal cues for 2 consecutive sessions.    Skilled Therapeutic Interventions/Progress Updates:    Pt completed bathing and dressing during session.  Daughter and daughter-in-law present for session.  Pt at time of session had not been cleared for weightbearing and active use of the LUE during session, so needed mod instructional cueing to refrain from using the LUE for stabilizing balance or pulling pants over hips.  Pt needed min assist for all transfers using the hemiwalker with the LUE.  Min assist for donning button up shirt with mod instructional cueing for correct sequence of starting with the LUE first.  Min assist also needed for donning underpants and pants over hips.  Completed session with therapist assist to donn TEDs and shoes.  Pt left in wheelchair to complete grooming tasks at the sink not completed earlier.  Therapy Documentation Precautions:  Precautions Precautions: Fall Precaution Comments: d/c sling LUE 10/24 Required Braces or Orthoses: Sling Restrictions Weight Bearing Restrictions: No LUE Weight Bearing: Weight bearing as tolerated Other Position/Activity Restrictions: D/c sling and WBAT and ROM 10/24  Pain: Pain Assessment Pain Assessment: No/denies  pain ADL: See Function Navigator for Current Functional Status.   Therapy/Group: Individual Therapy  Cledith Kamiya OTR/L 06/30/2016, 12:13 PM

## 2016-06-30 NOTE — Progress Notes (Signed)
Physical Therapy Session Note  Patient Details  Name: Melanie Hendricks MRN: [id number] Date of Birth: [birth year]  Today's Date: 06/30/2016 PT Individual Time: 0930-1030 PT Individual Time Calculation (min): 60 min    Short Term Goals: Week 1:  PT Short Term Goal 1 (Week 1): patient will transfer with mod Assist to R and L consistently.  PT Short Term Goal 2 (Week 1): patient will performed sit<>stand with min Assist.  PT Short Term Goal 3 (Week 1): Patient will ambulate 20ft with mod Assist from PT.  PT Short Term Goal 4 (Week 1): Patient will perform WC mobility >161ft with supervision Assist.   Skilled Therapeutic Interventions/Progress Updates:    New orders regarding ROM and WBAT status for LUE from surgeon. Sling discharged.  W/c mobility with BUE and BLE down to therapy gym for general introduction to motion of LUE with supervision and extra time. Gait training with RW with overall min assist x 220' with cues for R foot clearance, positioning of body inside of RW, and R knee control - no buckling noted. Neuro re-ed for sit <> stands, toe taps for coordination and single limb stance (10  reps x 2 sets RLE and 10 reps LLE and 7 reps (before knee buckling)), heel/toe raises (heel raises in standing and toe raises in seated postion x 10 reps each), and Nustep x 11 min on level 4 for reciprocal movement pattern retraining (and general strengthening and ROM). Pt instructed in transfers throughout session with verbal cues for hand placement and overall min assist. Family present during session and supportive. Ice pack applied to L shoulder at end of session. Educated on keeping on for 20 min and then taking it off.     Therapy Documentation Precautions:  Precautions Precautions: Fall Precaution Comments: d/c sling LUE 10/24 Required Braces or Orthoses: Sling Restrictions Weight Bearing Restrictions: No LUE Weight Bearing: Weight bearing as tolerated Other Position/Activity  Restrictions: D/c sling and WBAT and ROM 10/24  Pain:  No complaints of pain - some soreness with use of LUE during mobility now that orders have been discontinued.    See Function Navigator for Current Functional Status.   Therapy/Group: Individual Therapy  Canary Brim Ivory Broad, PT, DPT  06/30/2016, 11:06 AM

## 2016-06-30 NOTE — Progress Notes (Signed)
Occupational Therapy Session Note  Patient Details  Name: Melanie Hendricks MRN: [id number] Date of Birth: 05-16-1936  Today's Date: 06/30/2016 OT Individual Time: 1130-1200 make up time OT Individual Time Calculation (min): 30 min     Short Term Goals: Week 1:  OT Short Term Goal 1 (Week 1): Pt will demonstrate the ability to don and doff shoulder sling independently. OT Short Term Goal 2 (Week 1): Pt will perform shower transfer with min A in order to decrease level of assistance with self care.  OT Short Term Goal 3 (Week 1): Pt will perform LB dressing with mod A in order to decrease level of assist with self care.  OT Short Term Goal 4 (Week 1): Pt will perform demonstrate ability to maintain NWB on L UE without verbal cues for 2 consecutive sessions.    Skilled Therapeutic Interventions/Progress Updates:    Upon entering the room, pt seated in wheelchair with daughter present in room. Pt with 3/10 c/o pain in L shoulder this session but agreeable to OT intervention. Pt ambulating with RW 100' to day room with steady assistance. Pt practicing ambulation on carpeted surface as that is what she will have at home. Pt performing sit >supine with supervision and min verbal cues for technique onto mat. OT performing stretching to L shoulder within all planes with pt verbalizing 5/10 pain at her limits. Pt verbalizing L shoulder feeling much better at end of session and noted increased functional movement. Pt returned to room at end of session in same manner as above. Call bell and all needed items within reach upon exiting the room.   Therapy Documentation Precautions:  Precautions Precautions: Fall Precaution Comments: d/c sling LUE 10/24 Required Braces or Orthoses: Sling Restrictions Weight Bearing Restrictions: No LUE Weight Bearing: Weight bearing as tolerated Other Position/Activity Restrictions: D/c sling and WBAT and ROM 10/24  Pain: Pain Assessment Pain Assessment: No/denies  pain  See Function Navigator for Current Functional Status.   Therapy/Group: Individual Therapy  Phineas Semen 06/30/2016, 12:57 PM

## 2016-06-30 NOTE — Progress Notes (Signed)
L humerus xrays reviewed by Dr. Tamera Punt, given results and exam she is clinically healed. Okay to d/c L shoulder sling and start ROM and WBAT. Fu with Dr. Tamera Punt in 6 weeks.

## 2016-06-30 NOTE — Progress Notes (Signed)
Occupational Therapy Session Note  Patient Details  Name: Melanie Hendricks MRN: [id number] Date of Birth: [birth year]  Today's Date: 06/30/2016 OT Individual Time: [id number] OT Individual Time Calculation (min): 30 min     Short Term Goals: Week 1:  OT Short Term Goal 1 (Week 1): Pt will demonstrate the ability to don and doff shoulder sling independently. OT Short Term Goal 2 (Week 1): Pt will perform shower transfer with min A in order to decrease level of assistance with self care.  OT Short Term Goal 3 (Week 1): Pt will perform LB dressing with mod A in order to decrease level of assist with self care.  OT Short Term Goal 4 (Week 1): Pt will perform demonstrate ability to maintain NWB on L UE without verbal cues for 2 consecutive sessions.    Skilled Therapeutic Interventions/Progress Updates:    Pt transferred to the wheelchair with min assist using the RW.  Min instructional cueing to incorporate LUE functional weightbearing into the arm with sit to stand.  Pt worked on standing balance sit to stand while using the LUE for tossing bean bags and reaching for them.  Mod assist for reaching up to 70 degrees shoulder flexion while standing with use of the RW.  Removed RW for last set having pt attempt to reach down to the floor to pick up bean bag with mod assist for 4 intervals.  Returned to room at end of session with daughter-in-law present.     Therapy Documentation Precautions:  Precautions Precautions: Fall Precaution Comments: d/c sling LUE 10/24 Required Braces or Orthoses: Sling Restrictions Weight Bearing Restrictions: No LUE Weight Bearing: Weight bearing as tolerated Other Position/Activity Restrictions: D/c sling and WBAT and ROM 10/24  Pain: Pain Assessment Pain Assessment: Faces Faces Pain Scale: Hurts a little bit Pain Type: Acute pain Pain Location: Shoulder Pain Orientation: Left Pain Descriptors / Indicators: Discomfort Pain Onset: With Activity Pain  Intervention(s): Repositioned ADL: See Function Navigator for Current Functional Status.   Therapy/Group: Individual Therapy  Amaris Delafuente OTR/L 06/30/2016, 4:08 PM

## 2016-07-01 ENCOUNTER — Inpatient Hospital Stay (HOSPITAL_COMMUNITY): Payer: Medicare Other

## 2016-07-01 ENCOUNTER — Inpatient Hospital Stay (HOSPITAL_COMMUNITY): Payer: Medicare Other | Admitting: Occupational Therapy

## 2016-07-01 ENCOUNTER — Inpatient Hospital Stay (HOSPITAL_COMMUNITY): Payer: Medicare Other | Admitting: Physical Therapy

## 2016-07-01 NOTE — Progress Notes (Signed)
Occupational Therapy Session Note  Patient Details  Name: Melanie Hendricks MRN: [id number] Date of Birth: 09-Nov-1935  Today's Date: 07/01/2016 OT Individual Time: 1101-1200 OT Individual Time Calculation (min): 59 min     Short Term Goals: Week 1:  OT Short Term Goal 1 (Week 1): Pt will demonstrate the ability to don and doff shoulder sling independently. OT Short Term Goal 2 (Week 1): Pt will perform shower transfer with min A in order to decrease level of assistance with self care.  OT Short Term Goal 3 (Week 1): Pt will perform LB dressing with mod A in order to decrease level of assist with self care.  OT Short Term Goal 4 (Week 1): Pt will perform demonstrate ability to maintain NWB on L UE without verbal cues for 2 consecutive sessions.    Skilled Therapeutic Interventions/Progress Updates:    Pt worked on toilet transfers from wheelchair to elevated toilet with rail and use of RW.  She was able to complete with min assist overall with daughter being educated on safe performance as well.  Had her practice stand pivots from the wheelchair after being rolled into the bathroom as well as ambulation with the RW.  Completed stand pivots to 3:1 with hand held assist in case of urgency where she could not get to the bathroom.  Took pt to the therapy gym for second half of session.  She was able to transfer to the therapy mat onto her right side.  Therapist completed scapular mobilizations in sidelying with pt transitioning to supine for AAROM external rotation with slight abduction and shoulder flexion exercises.  Educated pt's daughter on AAROM exercises to be completed as well.  Returned back to room at end of session and pt left in wheelchair with daughter present.  Provided ice to left arm as well.   Therapy Documentation Precautions:  Precautions Precautions: Fall Precaution Comments: d/c sling LUE 10/24 Required Braces or Orthoses: Sling Restrictions Weight Bearing Restrictions:  Yes LUE Weight Bearing: Weight bearing as tolerated Other Position/Activity Restrictions: D/c sling and WBAT and ROM 10/24  Pain: Pain Assessment Pain Score: 2  ADL: See Function Navigator for Current Functional Status.   Therapy/Group: Individual Therapy  Lindzy Rupert OTR/L 07/01/2016, 12:24 PM

## 2016-07-01 NOTE — Progress Notes (Addendum)
Physical Therapy Note  Patient Details  Name: Melanie Hendricks MRN: [id number] Date of Birth: 11-11-1935 Today's Date: 07/01/2016  0920-1030, 70 min individual tx Pain: 2/10, premedicated  W/c propulsion using bil UEs x 100', supervision.  Neuromuscular re-education seated in w/c before gait: 2 x 10 each marching R/L, heel raises, toe raises, long arc quad knee ext with isometric holds.  Also  bil shoulder adduction 1 x 15 with 2# bar.   On NuStep for alternating reciprocal movement and trunk rotation x 4 extremities , limited excursion to tolerance of LUE, at level 4 x 5 minutes, without use of LUE at level 4, x 4 minutes. Trunk/head righting via trunk shortening/lengthening/rotating in high sitting, reaching out of BOS to R, wtithin BOS to L; reciprocal scooting for pelvic protraction/retraction. Gait on level tile with RW, pt demonstrating distractibility and mild R inattention. Gait up/down curb using railing and RW due to pain not allowing her to keep LUE on RW, mod/max assist, max cues for technique; on mulched area with mod assist.  dtr [place] observed.  Pt left resting in w/c with all needs within reach.[name] 07/01/2016, 7:55 AM

## 2016-07-01 NOTE — Patient Care Conference (Signed)
Inpatient RehabilitationTeam Conference and Plan of Care Update Date: 07/01/2016   Time: 10:55 Am    Patient Name: Melanie Hendricks      Medical Record Number: [id number]  Date of Birth: 1936/01/09 Sex: Female         Room/Bed: 4W09C/4W09C-01 Payor Info: Payor: [employer] MEDICARE / Plan: [employer] MEDICARE / Product Type: *No Product type* /    Admitting Diagnosis: Stroke  Admit Date/Time:  06/26/2016  6:57 PM Admission Comments: No comment available   Primary Diagnosis:  Left pontine cerebrovascular accident [facility]) Principal Problem: Left pontine cerebrovascular accident [facility])  Patient Active Problem List   Diagnosis Date Noted  . Post-operative pain   . Benign essential HTN   . Urinary retention   . Closed fracture of left proximal humerus   . Left pontine cerebrovascular accident ([place]) 06/26/2016  . Stroke (cerebrum) ([place]) 06/23/2016  . Blood pressure elevated without history of HTN 06/23/2016  . Left pontine stroke ([place]) 06/23/2016  . Stroke-like symptoms   . Eye movement disorder   . Closed fracture of neck of humerus with routine healing     Expected Discharge Date: Expected Discharge Date: 07/15/16  Team Members Present: Physician leading conference: Dr. Alysia Penna Social Worker Present: Ovidio Kin, LCSW Nurse Present: Other (comment) Drucie Opitz) PT Present: Barrie Folk, PT OT Present: Clyda Greener, OT SLP Present: Windell Moulding, SLP PPS Coordinator present : Daiva Nakayama, RN, CRRN     Current Status/Progress Goal Weekly Team Focus  Medical   Left arm sling removed, restricted ROM, wrist xray normal  improve LUE ROM and Strength to assist with ADL and mobility  upgraded wt bearing prec for left UE   Bowel/Bladder   pt continent of bowel and bladder. increased urgancy  minimal assist  contiue poc with b/b   Swallow/Nutrition/ Hydration             ADL's   min assist for UB selfcare, min assist for LB bathing, with mod assist for dressing,    Min assist for transfers.  Limitations in the left shoulder but now with slight AROM 0-50 degrees for flexion, 0-75 AAROM  supervision level goals   selfcare retraining, transfer training, therapeutic exercise, pt/family education   Mobility   min assist overall on level terrain for trasnfers and gait x 150'  will upgrade LTGs to: supervision overall including gait x 150', 4 steps 1 rails; keeping w/c in controlled env supervision x 150'  neuro re-ed, pt and family ed, balance, mobility and locomotion   Communication             Safety/Cognition/ Behavioral Observations            Pain   pain to left shoulder and hand. tylenol 650mg  PRN q 6hrs  3 or less  assess pain and medicate as needed   Skin   scattered bruising. reddend peri area with cream applied. abrasion to right buttock  free of skin breakdown min assist  assess skin q shift      *See Care Plan and progress notes for long and short-term goals.  Barriers to Discharge: will need assist post d/c, daughter can only assist for a few days post d/c    Possible Resolutions to Barriers:  cont rehab, possible     Discharge Planning/Teaching Needs:  Home with son and daughter in-law who both work during the day-daughter here form [place] until 11/6. Both daughter and daughter in-law have been through therapies with pt.  Team Discussion:  Goals supervision level, now able to WB through her arm-sling has been discharged 10/24. L-shoulder still causing her pain and limiting her in therapies. Wrist x-ray normal pain due to postioning. Pt hopeful will reach higher level due to feels good about her recovery. Daughter here observing in therapies. Discuss options with family.  Revisions to Treatment Plan:  DC 11/8   Continued Need for Acute Rehabilitation Level of Care: The patient requires daily medical management by a physician with specialized training in physical medicine and rehabilitation for the following conditions: Daily direction  of a multidisciplinary physical rehabilitation program to ensure safe treatment while eliciting the highest outcome that is of practical value to the patient.: Yes Daily medical management of patient stability for increased activity during participation in an intensive rehabilitation regime.: Yes Daily analysis of laboratory values and/or radiology reports with any subsequent need for medication adjustment of medical intervention for : Neurological problems;Other  Mikalah Skyles, Gardiner Rhyme 07/01/2016, 1:51 PM

## 2016-07-01 NOTE — Progress Notes (Signed)
Physical Therapy Session Note  Patient Details  Name: Melanie Hendricks MRN: [id number] Date of Birth: Aug 12, 1936  Today's Date: 07/01/2016 PT Individual Time: [id number] PT Individual Time Calculation (min): 60 min    Short Term Goals: Week 1:  PT Short Term Goal 1 (Week 1): patient will transfer with mod Assist to R and L consistently.  PT Short Term Goal 2 (Week 1): patient will performed sit<>stand with min Assist.  PT Short Term Goal 3 (Week 1): Patient will ambulate 66ft with mod Assist from PT.  PT Short Term Goal 4 (Week 1): Patient will perform WC mobility >175ft with supervision Assist.   Skilled Therapeutic Interventions/Progress Updates:     Patient received sitting in recliner and agreeable. Patient performed stand pivot transfer with min assist from PT with min cues for proper UE placement for safety. Pt instructed in [place] mobility with BUE and BLE propulsion technique with supervision Assist from PT for improved obstacle navigation. Gait training with RW and min Assist. x 120ft with min cues for improved heel toe stepping and AD management in turns.   Neuromuscular re-education: Squat to pick to 5 horse shoe off ground with LUE and place on basket ball goal with RUE.  Lateral weight shifting with mod cues trunk opening on the L and improved trunk closure on the R side.   Standing on wedge. 2 x 2 minutes progressing from 2 UE support to no UE support.   Standing on foam  With cross body reaches with no UE Support.  PT provided mod-min Assist for safety and increased use of ankle strategy to prevent LOB.   Variable gait training includign tandem walking, side stepping and backward walking 4 x 10 ft in each direction with min assist from PT with min cues for proper technique.   Patient returned to room and left sitting in [facility] with call bell in reach.    Therapy Documentation Precautions:  Precautions Precautions: Fall Precaution Comments: d/c sling LUE 10/24 Required  Braces or Orthoses: Sling Restrictions Weight Bearing Restrictions: Yes LUE Weight Bearing: Weight bearing as tolerated Other Position/Activity Restrictions: D/c sling and WBAT and ROM 10/24   Pain: Pain Assessment Pain Score: 2  Pain Type: Acute pain Pain Location: Shoulder Pain Orientation: Left Pain Descriptors / Indicators: Aching;Discomfort Pain Intervention(s): Medication (See eMAR)  See Function Navigator for Current Functional Status.   Therapy/Group: Individual Therapy  Lorie Phenix 07/01/2016, 5:47 PM

## 2016-07-01 NOTE — Progress Notes (Addendum)
Social Work Patient ID: Melanie Hendricks, female   DOB: May 31, 1936, 80 y.o.   MRN: [id number]  Met with pt and daughter to discuss team conference goals supervision level and target discharge 11/8. Pt feels now she can bear weight on her arm she will do well here and get to the level she wants to be at. Daughter to go back to [facility] 11/6-Monday. Pt will talk with son and daughter in-law and this worker Will discuss options for home. Will see if one can take some time off of work to be with and discuss other options as well. Pt feels much better now she can weight bear it does hurt but she is willing try to do as much as she can for herself. This is her main goal to be self sufficient.

## 2016-07-01 NOTE — Progress Notes (Signed)
[place] PHYSICAL MEDICINE & REHABILITATION     PROGRESS NOTE  Subjective/Complaints:  Appreciate orthopedic note. She is now able to utilize left upper extremity on the walker rather than using a hemiwalker in the right hand.  ROS:  Stress incontinence.  Denies CP, SOB, N/V/D.  Objective: Vital Signs: Blood pressure (!) 170/56, pulse (!) 114, temperature 98.1 F (36.7 C), temperature source Oral, resp. rate 16, height _0  (1.676 m), weight 108.3 kg (238 lb 12.1 oz), SpO2 98 %. Dg Wrist Complete Left  Result Date: 06/29/2016 CLINICAL DATA:  Fall 6 weeks ago with fractured left humerus, left wrist pain, subsequent encounter. EXAM: LEFT WRIST - COMPLETE 3+ VIEW COMPARISON:  Left humerus 05/14/2016. FINDINGS: No acute or healing fracture. Chondrocalcinosis. Mild degenerative changes at the first carpometacarpal joint. IMPRESSION: 1. No definite acute or healing fracture. 2. Chondrocalcinosis. Electronically Signed   By: Lorin Picket M.D.   On: 06/29/2016 17:15   Dg Humerus Left  Result Date: 06/29/2016 CLINICAL DATA:  Left humerus fracture, subsequent encounter. EXAM: LEFT HUMERUS - 2+ VIEW COMPARISON:  05/14/2016. FINDINGS: There is some blurring of the fracture lines involving the left humeral neck and greater tuberosity. Minimal callus formation. Fracture fragments are unchanged in alignment. Glenohumeral and acromioclavicular joints are intact. IMPRESSION: Healing fracture involving the left humeral neck and greater tuberosity. Unchanged alignment. Electronically Signed   By: Lorin Picket M.D.   On: 06/29/2016 17:17    Recent Labs  06/29/16 0904  WBC 5.6  HGB 13.9  HCT 42.1  PLT 299    Recent Labs  06/29/16 0904  NA 140  K 3.7  CL 108  GLUCOSE 176*  BUN 13  CREATININE 0.89  CALCIUM 9.7   CBG (last 3)  No results for input(s): GLUCAP in the last 72 hours.  Wt Readings from Last 3 Encounters:  06/26/16 108.3 kg (238 lb 12.1 oz)  06/24/16 109 kg (240 lb 6.4 oz)   05/14/16 90.7 kg (200 lb)    Physical Exam:  BP (!) 170/56 (BP Location: Right Arm)   Pulse (!) 114   Temp 98.1 F (36.7 C) (Oral)   Resp 16   Ht _1  (1.676 m)   Wt 108.3 kg (238 lb 12.1 oz)   SpO2 98%   BMI 38.54 kg/m  Constitutional: She appears well-developed. NAD. Vital signs reviewed. Eyes: EOMare normal. No discharge.  Cardiovascular: Normal rateand regular rhythm.  Respiratory: Effort normaland breath sounds normal. No respiratory distress.  GI: Soft. Bowel sounds are normal. She exhibits no distension.  Musculoskeletal: Edema and tenderness LUE. Neurological: She is alertand oriented.  Follows commands.  Good awareness of deficits.  Motor: 4/5 right biceps, triceps, wrist and HI  4/5 right HF, 3+KE and 4+ ADF/PF  LUE limited by pain, ~2+/5.  LLE 5/5 prox to distal.  Musculoskeletal: LUE edema mild, left wrist has no erythema or jt effusion, dorsal pain Skin: Skin is warm and dry.  Psychiatric: She has a normal mood and affect. Her behavior is normal. Judgmentand thought contentnormal  Assessment/Plan: 1. Functional deficits secondary to left pontine infarct which require 3+ hours per day of interdisciplinary therapy in a comprehensive inpatient rehab setting. Physiatrist is providing close team supervision and 24 hour management of active medical problems listed below. Physiatrist and rehab team continue to assess barriers to discharge/monitor patient progress toward functional and medical goals.  Function:  Bathing Bathing position   Position: Shower  Bathing parts Body parts bathed by patient: Left arm,  Chest, Abdomen, Front perineal area, Buttocks, Right upper leg, Left upper leg, Right lower leg, Left lower leg, Right arm, Back Body parts bathed by helper: Back, Right arm  Bathing assist Assist Level: Touching or steadying assistance(Pt > 75%)      Upper Body Dressing/Undressing Upper body dressing   What is the patient wearing?: Button up  shirt         Button up shirt - Perfomed by patient: Thread/unthread left sleeve, Pull shirt around back, Button/unbutton shirt Button up shirt - Perfomed by helper: Thread/unthread right sleeve    Upper body assist Assist Level: Set up, Touching or steadying assistance(Pt > 75%)   Set up : To obtain clothing/put away  Lower Body Dressing/Undressing Lower body dressing   What is the patient wearing?: Pants, Socks, [employer], Shoes Underwear - Performed by patient: Thread/unthread right underwear leg, Thread/unthread left underwear leg Underwear - Performed by helper: Pull underwear up/down Pants- Performed by patient: Thread/unthread right pants leg, Thread/unthread left pants leg Pants- Performed by helper: Pull pants up/down   Non-skid slipper socks- Performed by helper: Don/doff right sock, Don/doff left sock       Shoes - Performed by helper: Don/doff right shoe, Don/doff left shoe, Fasten right, Fasten left       TED Hose - Performed by helper: Don/doff right TED hose, Don/doff left TED hose  Lower body assist Assist for lower body dressing: Touching or steadying assistance (Pt > 75%)      Toileting Toileting   Toileting steps completed by patient: Adjust clothing prior to toileting, Adjust clothing after toileting, Performs perineal hygiene Toileting steps completed by helper: Adjust clothing after toileting Toileting Assistive Devices: Grab bar or rail  Toileting assist Assist level: Touching or steadying assistance (Pt.75%)   Transfers Chair/bed transfer   Chair/bed transfer method: Ambulatory Chair/bed transfer assist level: Touching or steadying assistance (Pt > 75%) Chair/bed transfer assistive device: Armrests, Medical sales representative     Max distance: 160 Assist level: Touching or steadying assistance (Pt > 75%)   Wheelchair   Type: Manual Max wheelchair distance: 100 Assist Level: Supervision or verbal cues  Cognition Comprehension  Comprehension assist level: Follows basic conversation/direction with extra time/assistive device  Expression Expression assist level: Expresses basic needs/ideas: With extra time/assistive device  Social Interaction Social Interaction assist level: Interacts appropriately with others with medication or extra time (anti-anxiety, antidepressant).  Problem Solving Problem solving assist level: Solves basic 90% of the time/requires cueing < 10% of the time  Memory Memory assist level: Recognizes or recalls 75 - 89% of the time/requires cueing 10 - 24% of the time     Medical Problem List and Plan: 1.  Right hemiplegia secondary to left pontine infarct             -Cont CIR PT, OT Team conference today please see physician documentation under team conference tab, met with team face-to-face to discuss problems,progress, and goals. Formulized individual treatment plan based on medical history, underlying problem and comorbidities. 2.  DVT Prophylaxis/Anticoagulation: Subcutaneous Lovenox. Monitor platelet counts of any signs of bleeding 3. Pain Management: Hydrocodone as needed 4. Left humerus fracture 5 weeks ago after a fall. Shoulder sling, nonweightbearing. 5. Neuropsych: This patient is capable of making decisions on her own behalf. 6. Skin/Wound Care: Routine skin checks, pressure relief as appropriate 7. Fluids/Electrolytes/Nutrition: Routine I&O  Cr. WNL on 10/20 8. Hyperlipidemia. Lipitor 9. Left humerus fracture 5 weeks ago after a fall. Weightbearing as  tolerated. Continue sling             -LUE elevation/compression to help control edema,More aggressive range of motion 10. HTN  Lisinopril 2.5 started on 10/21, increased on 10/23 to 29m Vitals:   06/30/16 1300 07/01/16 0436  BP: (!) 141/42 (!) 170/56  Pulse: 93 (!) 114  Resp: 16 16  Temp: 98.1 F (36.7 C) 98.1 F (36.7 C)   11. Urinary retention  resolved 12.  Left wrist pain- dorsal trauma ~5wks ago, Evidence of  chondrocalcinosis but no active pseudogout. Current symptoms are most likely due to immobility and stiffness LOS (Days) 5 A FACE TO FACE EVALUATION WAS PERFORMED  KCharlett Blake10/25/2017 12:19 PM

## 2016-07-02 ENCOUNTER — Inpatient Hospital Stay (HOSPITAL_COMMUNITY): Payer: Medicare Other | Admitting: Occupational Therapy

## 2016-07-02 ENCOUNTER — Inpatient Hospital Stay (HOSPITAL_COMMUNITY): Payer: Medicare Other

## 2016-07-02 MED ORDER — TRAMADOL HCL 50 MG PO TABS
50.0000 mg | ORAL_TABLET | Freq: Four times a day (QID) | ORAL | Status: DC | PRN
  Filled 2016-07-02: qty 1

## 2016-07-02 NOTE — Progress Notes (Signed)
Occupational Therapy Session Note  Patient Details  Name: Melanie Hendricks MRN: [id number] Date of Birth: 1936/07/29  Today's Date: 07/02/2016 OT Individual Time: [id number] OT Individual Time Calculation (min): 57 min     Short Term Goals: Week 1:  OT Short Term Goal 1 (Week 1): Pt will demonstrate the ability to don and doff shoulder sling independently. OT Short Term Goal 2 (Week 1): Pt will perform shower transfer with min A in order to decrease level of assistance with self care.  OT Short Term Goal 3 (Week 1): Pt will perform LB dressing with mod A in order to decrease level of assist with self care.  OT Short Term Goal 4 (Week 1): Pt will perform demonstrate ability to maintain NWB on L UE without verbal cues for 2 consecutive sessions.    Skilled Therapeutic Interventions/Progress Updates:    Pt completed toileting to begin session.  Min assist for transfer with use of the RW for support and daughter-in-law present.  She was able to complete transfer to walk-in shower with min guard assist and min instructional cueing for technique for using the RW.  She was able to complete all bathing with min guard assist.  Long handle sponge needed for washing her left underarm and LEs.  Incorporated LUE functional use for all bathing but still with limited shoulder AROM.  Dressing completed sit to stand from the wheelchair with overall min assist.  Min instructional cueing for sequencing of the hemi dressing techniques for donning LB clothing.   Pt left in wheelchair to complete grooming tasks with daughter-in-law.   Therapy Documentation Precautions:  Precautions Precautions: Fall Precaution Comments: d/c sling LUE 10/24 Required Braces or Orthoses: Sling Restrictions Weight Bearing Restrictions: Yes LUE Weight Bearing: Weight bearing as tolerated Other Position/Activity Restrictions: D/c sling and WBAT and ROM 10/24  Pain: Pain Assessment Pain Assessment: 0-10 Pain Score: 5  Pain  Type: Acute pain Pain Location: Shoulder Pain Orientation: Left Pain Descriptors / Indicators: Aching Pain Frequency: Intermittent Pain Onset: With Activity Patients Stated Pain Goal: 2 Pain Intervention(s): Medication (See eMAR) Multiple Pain Sites: No ADL: See Function Navigator for Current Functional Status.   Therapy/Group: Individual Therapy  Etienne Millward OTR/L 07/02/2016, 4:21 PM

## 2016-07-02 NOTE — Progress Notes (Signed)
Physical Therapy Note  Patient Details  Name: Melanie Hendricks MRN: [id number] Date of Birth: 28-Oct-1935 Today's Date: 07/02/2016  [id number], 75 min individual tx Pain: 1/10, UE, premedicated   Dtr in law [place] and dtr [place] observed tx.  Gait in room to/from toilet and sink, min guard to supervision.  neuromuscular re-education for coordination/warm up before gait: 10 x 1 bil marching, R/L long arc quad knee ext, ankle DF, performed in sitting.  NuStep at level 4 using 4 extremities for alternating reciprocal movements and LUE flexibility, x 4 min, with bil LEs only at level 5 x 3 minutes.  Gait up/down 6" high curb with RW, min assist, mod cues for techniques.  Patient demonstrates increased fall risk as noted by score of  30 /56 on Berg Balance Scale.  (<36= high risk for falls, close to 100%; 37-45 significant >80%; 46-51 moderate >50%; 52-55 lower >25%) Pt left resting in w/c and all needs within reach.  Pt progressing wel since she is now allowed WBAT LUE and is using RW.  LTGs upgraded.    Aryah Doering 07/02/2016, 7:53 AM

## 2016-07-02 NOTE — Progress Notes (Signed)
Occupational Therapy Session Note  Patient Details  Name: Melanie Hendricks MRN: [id number] Date of Birth: [birth year]  Today's Date: 07/02/2016 OT Individual Time: 1002-1102 OT Individual Time Calculation (min): 60 min     Short Term Goals: Week 1:  OT Short Term Goal 1 (Week 1): Pt will demonstrate the ability to don and doff shoulder sling independently. OT Short Term Goal 2 (Week 1): Pt will perform shower transfer with min A in order to decrease level of assistance with self care.  OT Short Term Goal 3 (Week 1): Pt will perform LB dressing with mod A in order to decrease level of assist with self care.  OT Short Term Goal 4 (Week 1): Pt will perform demonstrate ability to maintain NWB on L UE without verbal cues for 2 consecutive sessions.    Skilled Therapeutic Interventions/Progress Updates:    Pt worked on [employer] and stretching during session.  Educated pt's daughter in law on AAROM exercises for shoulder flexion, external rotation, and abduction.  Pt placed in supine to work on AAROM shoulder flexion, external rotation, and abduction.  Therapist applied gentle distraction at glenohumeral joint while completing stretches to the previously mentioned ROMs.  Pt completed AAROM shoulder flexion to approximately 100 degrees with end range pain and approximately 80 degrees abduction with end range pain.  Pt completed dowel rod shoulder flexion for 2 sets of 10 reps while in supine and therapist facilitating occasional min assist.  Pt worked on table slides in sitting for shoulder flexion and abduction as well.  Returned to room at end of session via wheelchair.  Pt left in recliner with ice applied to the left arm at end of session.  Therapy Documentation Precautions:  Precautions Precautions: Fall Precaution Comments: d/c sling LUE 10/24 Required Braces or Orthoses: Sling Restrictions Weight Bearing Restrictions: No LUE Weight Bearing: Weight bearing as tolerated Other  Position/Activity Restrictions: D/c sling and WBAT and ROM 10/24  Pain: Pain Assessment Pain Assessment: No/denies pain Pain Score: 2  ADL: See Function Navigator for Current Functional Status.   Therapy/Group: Individual Therapy  [name] OTR/L 07/02/2016, 12:34 PM

## 2016-07-02 NOTE — Progress Notes (Signed)
[place] PHYSICAL MEDICINE & REHABILITATION     PROGRESS NOTE  Subjective/Complaints:  Patient participating in physical therapy in the gym right now. She has some tightness in the right knee and right foot area. No pain. Slept well.  ROS:  Stress incontinence.  Denies CP, SOB, N/V/D.  Objective: Vital Signs: Blood pressure (!) 140/50, pulse 94, temperature 97.7 F (36.5 C), temperature source Oral, resp. rate 18, height 5\' 6"  (1.676 m), weight 108.3 kg (238 lb 12.1 oz), SpO2 96 %. No results found.  Recent Labs  06/29/16 0904  WBC 5.6  HGB 13.9  HCT 42.1  PLT 299    Recent Labs  06/29/16 0904  NA 140  K 3.7  CL 108  GLUCOSE 176*  BUN 13  CREATININE 0.89  CALCIUM 9.7   CBG (last 3)  No results for input(s): GLUCAP in the last 72 hours.  Wt Readings from Last 3 Encounters:  06/26/16 108.3 kg (238 lb 12.1 oz)  06/24/16 109 kg (240 lb 6.4 oz)  05/14/16 90.7 kg (200 lb)    Physical Exam:  BP (!) 140/50 (BP Location: Right Arm)   Pulse 94   Temp 97.7 F (36.5 C) (Oral)   Resp 18   Ht 5\' 6"  (1.676 m)   Wt 108.3 kg (238 lb 12.1 oz)   SpO2 96%   BMI 38.54 kg/m  Constitutional: She appears well-developed. NAD. Vital signs reviewed. Eyes: EOMare normal. No discharge.  Cardiovascular: Normal rateand regular rhythm.  Respiratory: Effort normaland breath sounds normal. No respiratory distress.  GI: Soft. Bowel sounds are normal. She exhibits no distension.  MusculoskeletalRight knee without evidence of effusion. Right ankle without evidence of effusion. No pain with range of motion. No limitation in range of motion. Left shoulder limited range of motion  Neurological: She is alertand oriented.  Follows commands.  Good awareness of deficits.  Motor: 4/5 right biceps, triceps, wrist and HI  4/5 right HF, 3+KE and 4+ ADF/PF  LUE limited by pain, ~2+/5.  LLE 5/5 prox to distal.  Musculoskeletal: LUE edema mild, left wrist has no erythema or jt effusion,  dorsal pain Skin: Skin is warm and dry.  Psychiatric: She has a normal mood and affect. Her behavior is normal. Judgmentand thought contentnormal  Assessment/Plan: 1. Functional deficits secondary to left pontine infarct which require 3+ hours per day of interdisciplinary therapy in a comprehensive inpatient rehab setting. Physiatrist is providing close team supervision and 24 hour management of active medical problems listed below. Physiatrist and rehab team continue to assess barriers to discharge/monitor patient progress toward functional and medical goals.  Function:  Bathing Bathing position   Position: Shower  Bathing parts Body parts bathed by patient: Left arm, Chest, Abdomen, Front perineal area, Buttocks, Right upper leg, Left upper leg, Right lower leg, Left lower leg, Right arm, Back Body parts bathed by helper: Back, Right arm  Bathing assist Assist Level: Touching or steadying assistance(Pt > 75%)      Upper Body Dressing/Undressing Upper body dressing   What is the patient wearing?: Button up shirt         Button up shirt - Perfomed by patient: Thread/unthread left sleeve, Pull shirt around back, Button/unbutton shirt Button up shirt - Perfomed by helper: Thread/unthread right sleeve    Upper body assist Assist Level: Set up, Touching or steadying assistance(Pt > 75%)   Set up : To obtain clothing/put away  Lower Body Dressing/Undressing Lower body dressing   What is the patient  wearing?: Pants, Socks, [employer], Shoes Underwear - Performed by patient: Thread/unthread right underwear leg, Thread/unthread left underwear leg Underwear - Performed by helper: Pull underwear up/down Pants- Performed by patient: Thread/unthread right pants leg, Thread/unthread left pants leg Pants- Performed by helper: Pull pants up/down   Non-skid slipper socks- Performed by helper: Don/doff right sock, Don/doff left sock       Shoes - Performed by helper: Don/doff right shoe,  Don/doff left shoe, Fasten right, Fasten left       TED Hose - Performed by helper: Don/doff right TED hose, Don/doff left TED hose  Lower body assist Assist for lower body dressing: Touching or steadying assistance (Pt > 75%)      Toileting Toileting   Toileting steps completed by patient: Adjust clothing prior to toileting, Performs perineal hygiene, Adjust clothing after toileting Toileting steps completed by helper: Adjust clothing after toileting Toileting Assistive Devices: Grab bar or rail  Toileting assist Assist level: Touching or steadying assistance (Pt.75%)   Transfers Chair/bed transfer   Chair/bed transfer method: Ambulatory Chair/bed transfer assist level: Touching or steadying assistance (Pt > 75%) Chair/bed transfer assistive device: Armrests, Medical sales representative     Max distance: 160 Assist level: Touching or steadying assistance (Pt > 75%)   Wheelchair   Type: Manual Max wheelchair distance: 100 Assist Level: Supervision or verbal cues  Cognition Comprehension Comprehension assist level: Follows basic conversation/direction with extra time/assistive device  Expression Expression assist level: Expresses basic needs/ideas: With extra time/assistive device  Social Interaction Social Interaction assist level: Interacts appropriately with others with medication or extra time (anti-anxiety, antidepressant).  Problem Solving Problem solving assist level: Solves basic 90% of the time/requires cueing < 10% of the time  Memory Memory assist level: Recognizes or recalls 75 - 89% of the time/requires cueing 10 - 24% of the time     Medical Problem List and Plan: 1.  Right hemiplegia secondary to left pontine infarct             -Cont CIR PT, OT, Patient and family are aware of 07/15/2016 discharge  2.  DVT Prophylaxis/Anticoagulation: Subcutaneous Lovenox. Monitor platelet counts of any signs of bleeding 3. Pain Management: Hydrocodone as needed 4.  Left humerus fracture 5 weeks ago after a fall. Shoulder sling, nonweightbearing. 5. Neuropsych: This patient is capable of making decisions on her own behalf. 6. Skin/Wound Care: Routine skin checks, pressure relief as appropriate 7. Fluids/Electrolytes/Nutrition: Routine I&O  Cr. WNL on 10/20 8. Hyperlipidemia. Lipitor 9. Left humerus fracture 5 weeks ago after a fall. Weightbearing as tolerated. Continue sling             -LUE elevation/compression to help control edema,More aggressive range of motion 10. HTN  Lisinopril 2.5 started on 10/21, increased on 10/23 to 5mg , no changes for now Vitals:   07/01/16 1320 07/02/16 0429  BP: (!) 128/52 (!) 140/50  Pulse: 93 94  Resp: 17 18  Temp: 97.6 F (36.4 C) 97.7 F (36.5 C)   11. Urinary retention  resolved, No burning with urination, no frequency to indicate any UTI 12.  Left wrist pain- dorsal trauma ~5wks ago, Evidence of chondrocalcinosis but no active pseudogout. Current symptoms are most likely due to immobility and stiffness LOS (Days) 6 A FACE TO FACE EVALUATION WAS PERFORMED  Charlett Blake 07/02/2016 8:54 AM

## 2016-07-03 ENCOUNTER — Inpatient Hospital Stay (HOSPITAL_COMMUNITY): Payer: Medicare Other | Admitting: Occupational Therapy

## 2016-07-03 ENCOUNTER — Inpatient Hospital Stay (HOSPITAL_COMMUNITY): Payer: Medicare Other | Admitting: Physical Therapy

## 2016-07-03 LAB — CREATININE, SERUM
CREATININE: 0.62 mg/dL (ref 0.44–1.00)
GFR calc non Af Amer: >60 mL/min (ref >60)

## 2016-07-03 NOTE — Progress Notes (Signed)
Physical Therapy Session Note  Patient Details  Name: Melanie Hendricks MRN: [id number] Date of Birth: 21-Dec-1935  Today's Date: 07/03/2016 PT Individual Time: 0901-1000 AND 1600-1700 PT Individual Time Calculation (min): 59 min AND 60 min    Short Term Goals: Week 1:  PT Short Term Goal 1 (Week 1): patient will transfer with mod Assist to R and L consistently.  PT Short Term Goal 2 (Week 1): patient will performed sit<>stand with min Assist.  PT Short Term Goal 3 (Week 1): Patient will ambulate 103ft with mod Assist from PT.  PT Short Term Goal 4 (Week 1): Patient will perform WC mobility >154ft with supervision Assist.  Week 2:     Skilled Therapeutic Interventions/Progress Updates:     Patient received sitting in WC and agreeable to PT.  PT assisted patient to don Ted hose and shoes for time mangement.  WC mobility for 165ft with BUE and BLE propulsion and supervision Assist from PT for doorway management.   Gait training with RW with supervision Assist x 138ft. Gait without AD 58ft x 2 with Min Assist. Dynamic gait training with figure 8 around 2 cones at 6 ft. PT provided min cues for increased step length with the R foot with figure eight and to increased stance time on the R foot.  Dynamic balance 4 square forward and lateral stepping x 12 BLE with emphasis on increased weight shifting onto the  RLE. Forward and lateral mini lunge stepping toward target. PT provided min Assist through  Static balance on airex pad with cogniftive task of building puzzle PT provided min Assist  Progressing to supervision Assist for 5 minutes.   Patient instructed in gait training back to room with RW x 128ft and supervision assist with min cues for gait pattern to improved stance time on the RLE.  Patient left sitting in WC with call bell in reach.     Session 2:  WC mobility in hall x 248ft with supervision Assist from PT.   Gait on uneven surface of cement sidewalk with RW for 120ft x 2, with  PT provided overall supervision Assist with min assist x 1 to prevent Lateral LOB to the right with quick turn to L. Min cues for improved step length and increased cadence as possible.  Standing balance including tandem stance with finger tip support on RW 3x 30 seconds BLE. SLS 3 x 30 seconds with BUE on RW. Standing with feel together without RW 3 x 30 seconds with long rest breaks between. PT provided mod cues for improved use of ankle strategy to prevent Lateral and posterior LOB.   Patient returned to room and left sitting in recliner with call bell in reach.             Therapy Documentation Precautions:  Precautions Precautions: Fall Precaution Comments: d/c sling LUE 10/24 Required Braces or Orthoses: Sling Restrictions Weight Bearing Restrictions: No LUE Weight Bearing: Weight bearing as tolerated Other Position/Activity Restrictions: D/c sling and WBAT and ROM 10/24 General:   Vital Signs:  Pain: Pain Assessment Pain Assessment: 0-10 Pain Score: 1  Pain Location: Shoulder Pain Orientation: Left Pain Descriptors / Indicators: Sore Pain Onset: On-going Patients Stated Pain Goal: 0 Pain Intervention(s): Ambulation/increased activity   See Function Navigator for Current Functional Status.   Therapy/Group: Individual Therapy  Lorie Phenix 07/03/2016, 10:03 AM

## 2016-07-03 NOTE — Progress Notes (Signed)
Occupational Therapy Weekly Progress Note  Patient Details  Name: Melanie Hendricks MRN: [id number] Date of Birth: Oct 04, 1935  Beginning of progress report period: June 27, 2016 End of progress report period: July 03, 2016  Today's Date: 07/03/2016 OT Individual Time: 3818-2993 OT Individual Time Calculation (min): 72 min     Patient has met 4 of 4 short term goals.  Melanie Hendricks continues to make steady progress with OT at this time, and recently was allowed to complete AROM as tolerated and WBAT in the LUE.  Because of this she has progressed to an overall min assist level for all toilet transfers and shower transfers.  Supervision for UB bathing with use of a LH sponge for reaching the left shoulder.  Supervision as well for UB dressing to donn button up shirt.  She continues to need mod assist for LB dressing however secondary to needing assist with tying of her shoes and for pulling up underpants and pants on the left side.  AROM left shoulder flexion is approximately 0- 60 degrees after heat and stretching.   RUE is currently functioning at a slightly diminished level with difficulty noted with internal rotation when reaching behind her back, but otherwise no functional difficulty.  Pt's family has been present for all sessions and educated on AAROM for the shoulder as well and were able to return demonstrate efficiently.  Feel Melanie Hendricks will need continued OT to further progress LUE function and to progress to a supervision level for all selfcare tasks and ADLs.  Will continue to follow for CIR level therapies.    Patient continues to demonstrate the following deficits: decreased balance, decreased LUE functional use, decreased RUE functional use,  and therefore will continue to benefit from skilled OT intervention to enhance overall performance with BADL.  Patient progressing toward long term goals..  Continue plan of care.  OT Short Term Goals Week 2:  OT Short Term Goal 1 (Week  2): Pt will demonstrate AROM left shoulder flexion to 80 degrees for greater independence with selfcare tasks.  OT Short Term Goal 2 (Week 2): Pt will manage clothing with supervision sit to stand 3 consecutive sessions.  OT Short Term Goal 3 (Week 2): Pt will complete all bathing with supervision sit to stand.  OT Short Term Goal 4 (Week 2): Pt will perform LB dressing supervision with AE PRN.     Skilled Therapeutic Interventions/Progress Updates:    Pt transferred down to the gym with min guard assist using the RW.  She transferred to supine and therapist applied moist heat to the left shoulder for 10 mins prior to working on ROM.  Therapist worked on stretching shoulder flexion, abduction, and abduction with shoulder flexion.  Increased pain in the left elbow with full extension during abduction as well as posterior aspect of shoulder.  Pt completed 2 sets of 10 repetitions for shoulder flexion using BUEs and dowel rod in supine.  Transitioned to sitting for work on AROM shoulder flexion and reaching task.  She was able to pick up checkers and place at shoulder level with min assist using the LUE.  Compensatory shoulder hike and slight lean to the left noted.  Pt returned to room at end of session and transferred to the recliner to rest.  Family present as well.     Therapy Documentation Precautions:  Precautions Precautions: Fall Precaution Comments: d/c sling LUE 10/24 Required Braces or Orthoses: Sling Restrictions Weight Bearing Restrictions: Yes LUE Weight Bearing: Weight bearing as  tolerated Other Position/Activity Restrictions: D/c sling and WBAT and ROM 10/24  Pain: Pain Assessment Pain Assessment: 0-10 Pain Score: 1  Faces Pain Scale: Hurts little more Pain Type: Acute pain Pain Location: Shoulder Pain Orientation: Left Pain Descriptors / Indicators: Aching;Sore Pain Onset: With Activity Patients Stated Pain Goal: 0 Pain Intervention(s): Repositioned Multiple Pain Sites:  No ADL: See Function Navigator for Current Functional Status.   Therapy/Group: Individual Therapy  Melanie Hendricks OTR/L 07/03/2016, 4:57 PM

## 2016-07-03 NOTE — Progress Notes (Signed)
[place] PHYSICAL MEDICINE & REHABILITATION     PROGRESS NOTE  Subjective/Complaints:  Discussed patient's lack of primary care physician. She had a physician that retired and did not ever switch over to the new covering doctor.  ROS:  Stress incontinence.  Denies CP, SOB, N/V/D.  Objective: Vital Signs: Blood pressure (!) 146/63, pulse (!) 101, temperature 98.1 F (36.7 C), temperature source Oral, resp. rate 18, height 5\' 6"  (1.676 m), weight 108.3 kg (238 lb 12.1 oz), SpO2 95 %. No results found. No results for input(s): WBC, HGB, HCT, PLT in the last 72 hours.  Recent Labs  07/03/16 0551  CREATININE 0.62   CBG (last 3)  No results for input(s): GLUCAP in the last 72 hours.  Wt Readings from Last 3 Encounters:  06/26/16 108.3 kg (238 lb 12.1 oz)  06/24/16 109 kg (240 lb 6.4 oz)  05/14/16 90.7 kg (200 lb)    Physical Exam:  BP (!) 146/63 (BP Location: Right Arm)   Pulse (!) 101   Temp 98.1 F (36.7 C) (Oral)   Resp 18   Ht 5\' 6"  (1.676 m)   Wt 108.3 kg (238 lb 12.1 oz)   SpO2 95%   BMI 38.54 kg/m  Constitutional: She appears well-developed. NAD. Vital signs reviewed. Eyes: EOMare normal. No discharge.  Cardiovascular: Normal rateand regular rhythm.  Respiratory: Effort normaland breath sounds normal. No respiratory distress.  GI: Soft. Bowel sounds are normal. She exhibits no distension.  MusculoskeletalRight knee without evidence of effusion. Right ankle without evidence of effusion. No pain with range of motion. No limitation in range of motion. Left shoulder limited range of motion  Neurological: She is alertand oriented.  Follows commands.  Good awareness of deficits.  Motor: 4/5 right biceps, triceps, wrist and HI  4/5 right HF, 3+KE and 4+ ADF/PF  LUE limited by pain, ~2+/5.  LLE 5/5 prox to distal.  Musculoskeletal: LUE edema mild, left wrist has no erythema or jt effusion, dorsal pain Skin: Skin is warm and dry.  Psychiatric: She has a normal  mood and affect. Her behavior is normal. Judgmentand thought contentnormal  Assessment/Plan: 1. Functional deficits secondary to left pontine infarct which require 3+ hours per day of interdisciplinary therapy in a comprehensive inpatient rehab setting. Physiatrist is providing close team supervision and 24 hour management of active medical problems listed below. Physiatrist and rehab team continue to assess barriers to discharge/monitor patient progress toward functional and medical goals.  Function:  Bathing Bathing position   Position: Shower  Bathing parts Body parts bathed by patient: Left arm, Chest, Abdomen, Front perineal area, Buttocks, Right upper leg, Left upper leg, Right lower leg, Left lower leg, Right arm Body parts bathed by helper: Back, Right arm  Bathing assist Assist Level: Touching or steadying assistance(Pt > 75%)      Upper Body Dressing/Undressing Upper body dressing   What is the patient wearing?: Button up shirt         Button up shirt - Perfomed by patient: Thread/unthread left sleeve, Pull shirt around back, Button/unbutton shirt, Thread/unthread right sleeve Button up shirt - Perfomed by helper: Thread/unthread right sleeve    Upper body assist Assist Level: Set up   Set up : To obtain clothing/put away  Lower Body Dressing/Undressing Lower body dressing   What is the patient wearing?: Pants, Socks, [employer], Shoes Underwear - Performed by patient: Thread/unthread right underwear leg, Thread/unthread left underwear leg, Pull underwear up/down Underwear - Performed by helper: Pull underwear  up/down Pants- Performed by patient: Thread/unthread right pants leg, Thread/unthread left pants leg, Pull pants up/down Pants- Performed by helper: Pull pants up/down   Non-skid slipper socks- Performed by helper: Don/doff right sock, Don/doff left sock     Shoes - Performed by patient: Don/doff right shoe, Don/doff left shoe (shoes were already tied) Shoes  - Performed by helper: Don/doff right shoe, Don/doff left shoe, Fasten right, Fasten left     TED Hose - Performed by patient: Don/doff right TED hose, Don/doff left TED hose TED Hose - Performed by helper: Don/doff right TED hose, Don/doff left TED hose  Lower body assist Assist for lower body dressing: Touching or steadying assistance (Pt > 75%)      Toileting Toileting   Toileting steps completed by patient: Adjust clothing prior to toileting, Performs perineal hygiene, Adjust clothing after toileting Toileting steps completed by helper: Adjust clothing after toileting Toileting Assistive Devices: Grab bar or rail  Toileting assist Assist level: Touching or steadying assistance (Pt.75%)   Transfers Chair/bed transfer   Chair/bed transfer method: Ambulatory Chair/bed transfer assist level: Touching or steadying assistance (Pt > 75%) Chair/bed transfer assistive device: Armrests, Medical sales representative     Max distance: 50 Assist level: Touching or steadying assistance (Pt > 75%)   Wheelchair   Type: Manual Max wheelchair distance: 160 Assist Level: Supervision or verbal cues  Cognition Comprehension Comprehension assist level: Follows basic conversation/direction with no assist  Expression Expression assist level: Expresses basic needs/ideas: With no assist  Social Interaction Social Interaction assist level: Interacts appropriately 90% of the time - Needs monitoring or encouragement for participation or interaction.  Problem Solving Problem solving assist level: Solves basic problems with no assist  Memory Memory assist level: Recognizes or recalls 75 - 89% of the time/requires cueing 10 - 24% of the time     Medical Problem List and Plan: 1.  Right hemiplegia secondary to left pontine infarct             -Cont CIR PT, OT, Making good progress 2.  DVT Prophylaxis/Anticoagulation: Subcutaneous Lovenox. Monitor platelet counts of any signs of bleeding 3. Pain  Management: Hydrocodone as needed 4. Left humerus fracture 5 weeks ago after a fall. Weightbearing as tolerated, range of motion as tolerated as per orthopedic 5. Neuropsych: This patient is capable of making decisions on her own behalf. 6. Skin/Wound Care: Routine skin checks, pressure relief as appropriate 7. Fluids/Electrolytes/Nutrition: Routine I&O  Cr. WNL on 10/20 8. Hyperlipidemia. Lipitor 9. Left humerus fracture 5 weeks ago after a fall. Weightbearing as tolerated. Continue sling             -LUE elevation/compression to help control edema,More aggressive range of motion 10. HTN  Lisinopril 2.5 started on 10/21, increased on 10/23 to 5mg , no changes for now, will need primary care follow-up for hypertension Vitals:   07/02/16 1454 07/03/16 0525  BP: (!) 149/55 (!) 146/63  Pulse: 84 (!) 101  Resp: 18 18  Temp: 97.7 F (36.5 C) 98.1 F (36.7 C)    LOS (Days) 7 A FACE TO FACE EVALUATION WAS PERFORMED  Aleasha Fregeau E 07/03/2016 9:08 AM

## 2016-07-04 ENCOUNTER — Inpatient Hospital Stay (HOSPITAL_COMMUNITY): Payer: Medicare Other | Admitting: Occupational Therapy

## 2016-07-04 ENCOUNTER — Inpatient Hospital Stay (HOSPITAL_COMMUNITY): Payer: Medicare Other | Admitting: Physical Therapy

## 2016-07-04 MED ORDER — TRAZODONE HCL 50 MG PO TABS
50.0000 mg | ORAL_TABLET | Freq: Every evening | ORAL | Status: DC | PRN
  Administered 2016-07-07 – 2016-07-09 (×3): 50 mg via ORAL
  Filled 2016-07-04 (×3): qty 1

## 2016-07-04 MED ORDER — HYDROCODONE-ACETAMINOPHEN 5-325 MG PO TABS
0.5000 | ORAL_TABLET | Freq: Four times a day (QID) | ORAL | Status: DC | PRN
  Administered 2016-07-05: 0.5 via ORAL
  Administered 2016-07-06: 1 via ORAL
  Administered 2016-07-07 – 2016-07-10 (×4): 0.5 via ORAL
  Filled 2016-07-04 (×6): qty 1

## 2016-07-04 NOTE — Progress Notes (Signed)
Occupational Therapy Session Note  Patient Details  Name: Melanie Hendricks MRN: [id number] Date of Birth: 1935/10/12  Today's Date: 07/04/2016 OT Individual Time: [id number] OT Individual Time Calculation (min): 30 min     Short Term Goals: Week 2:  OT Short Term Goal 1 (Week 2): Pt will demonstrate AROM left shoulder flexion to 80 degrees for greater independence with selfcare tasks.  OT Short Term Goal 2 (Week 2): Pt will manage clothing with supervision sit to stand 3 consecutive sessions.  OT Short Term Goal 3 (Week 2): Pt will complete all bathing with supervision sit to stand.  OT Short Term Goal 4 (Week 2): Pt will perform LB dressing supervision with AE PRN.    Skilled Therapeutic Interventions/Progress Updates:    Pt completed ambulation to and from the therapy gym with supervision using the RW.  Began with gentle stretching and AAROM shoulder flexion, adduction, and abduction in sitting  Worked on AAROM shoulder flexion using dowel rod in vertical position.  Pt completed one set of 20 repetitions with the ability to achieve AAROM 0-110 degrees by end of set.  Issued dowel rod for pt to work on this in her room as well.  Had pt work on sliding the left hand up a slanted board to work on activation of shoulder flexion with decreased shoulder hike.  Min assist needed to keep her from leaning to the right and attempting to compensate.  She was able to ambulate back to the room with use of the RW and supervision as well.  Pt left in recliner chair with call button and phone in reach.    Therapy Documentation Precautions:  Precautions Precautions: Fall Precaution Comments:   Required Braces or Orthoses: Sling Restrictions Weight Bearing Restrictions: No LUE Weight Bearing: Weight bearing as tolerated Other Position/Activity Restrictions: D/c sling and WBAT and ROM 10/24  Pain: Pain Assessment Pain Assessment: Faces Faces Pain Scale: Hurts little more Pain Type: Acute pain Pain  Location: Shoulder Pain Orientation: Left Pain Descriptors / Indicators: Guarding;Grimacing Pain Onset: With Activity Pain Intervention(s): Repositioned ADL: See Function Navigator for Current Functional Status.   Therapy/Group: Individual Therapy  Shyniece Scripter OTR/L 07/04/2016, 4:03 PM

## 2016-07-04 NOTE — Progress Notes (Signed)
[place] PHYSICAL MEDICINE & REHABILITATION     PROGRESS NOTE  Subjective/Complaints:  Trying to not use hydrocodone d/t SE. Had some issues falling asleep.  ROS:  Stress incontinence.  Denies CP, SOB, N/V/D.  Objective: Vital Signs: Blood pressure (!) 170/61, pulse 98, temperature 97.6 F (36.4 C), temperature source Oral, resp. rate 17, height 5\' 6"  (1.676 m), weight 108.3 kg (238 lb 12.1 oz), SpO2 97 %. No results found. No results for input(s): WBC, HGB, HCT, PLT in the last 72 hours.  Recent Labs  07/03/16 0551  CREATININE 0.62   CBG (last 3)  No results for input(s): GLUCAP in the last 72 hours.  Wt Readings from Last 3 Encounters:  06/26/16 108.3 kg (238 lb 12.1 oz)  06/24/16 109 kg (240 lb 6.4 oz)  05/14/16 90.7 kg (200 lb)    Physical Exam:  BP (!) 170/61 Comment: patient in the middle of PT, will recheck  Pulse 98   Temp 97.6 F (36.4 C) (Oral)   Resp 17   Ht 5\' 6"  (1.676 m)   Wt 108.3 kg (238 lb 12.1 oz)   SpO2 97%   BMI 38.54 kg/m  Constitutional: She appears well-developed. NAD. Vital signs reviewed. Eyes: EOMare normal. No discharge.  Cardiovascular: Normal rateand regular rhythm.  Respiratory: Effort normaland breath sounds normal. No respiratory distress.  GI: Soft. Bowel sounds are normal. She exhibits no distension.  MusculoskeletalRight knee without evidence of effusion. Right ankle without evidence of effusion. No pain with range of motion. No limitation in range of motion. Left shoulder limited range of motion  Neurological: She is alertand oriented.  Follows commands.  Good awareness of deficits.  Motor: 4/5 right biceps, triceps, wrist and HI  4/5 right HF, 3+KE and 4+ ADF/PF  LUE limited by pain, ~2+/5.  LLE 5/5 prox to distal.  Musculoskeletal: LUE edema mild, left wrist has no erythema or jt effusion, dorsal pain Skin: Skin is warm and dry.  Psychiatric: She has a normal mood and affect. Her behavior is normal. Judgmentand  thought contentnormal  Assessment/Plan: 1. Functional deficits secondary to left pontine infarct which require 3+ hours per day of interdisciplinary therapy in a comprehensive inpatient rehab setting. Physiatrist is providing close team supervision and 24 hour management of active medical problems listed below. Physiatrist and rehab team continue to assess barriers to discharge/monitor patient progress toward functional and medical goals.  Function:  Bathing Bathing position   Position: Shower  Bathing parts Body parts bathed by patient: Left arm, Chest, Abdomen, Front perineal area, Buttocks, Right upper leg, Left upper leg, Right lower leg, Left lower leg, Right arm Body parts bathed by helper: Back, Right arm  Bathing assist Assist Level: Touching or steadying assistance(Pt > 75%)      Upper Body Dressing/Undressing Upper body dressing   What is the patient wearing?: Button up shirt         Button up shirt - Perfomed by patient: Thread/unthread left sleeve, Pull shirt around back, Button/unbutton shirt, Thread/unthread right sleeve Button up shirt - Perfomed by helper: Thread/unthread right sleeve    Upper body assist Assist Level: Set up   Set up : To obtain clothing/put away  Lower Body Dressing/Undressing Lower body dressing   What is the patient wearing?: Pants, Socks, [employer], Shoes Underwear - Performed by patient: Thread/unthread right underwear leg, Thread/unthread left underwear leg, Pull underwear up/down Underwear - Performed by helper: Pull underwear up/down Pants- Performed by patient: Thread/unthread right pants leg, Thread/unthread  left pants leg, Pull pants up/down Pants- Performed by helper: Pull pants up/down   Non-skid slipper socks- Performed by helper: Don/doff right sock, Don/doff left sock     Shoes - Performed by patient: Don/doff right shoe, Don/doff left shoe (shoes were already tied) Shoes - Performed by helper: Don/doff right shoe, Don/doff  left shoe, Fasten right, Fasten left     TED Hose - Performed by patient: Don/doff right TED hose, Don/doff left TED hose TED Hose - Performed by helper: Don/doff right TED hose, Don/doff left TED hose  Lower body assist Assist for lower body dressing: Touching or steadying assistance (Pt > 75%)      Toileting Toileting   Toileting steps completed by patient: Adjust clothing prior to toileting, Performs perineal hygiene, Adjust clothing after toileting Toileting steps completed by helper: Adjust clothing after toileting Toileting Assistive Devices: Grab bar or rail  Toileting assist Assist level: Touching or steadying assistance (Pt.75%)   Transfers Chair/bed transfer   Chair/bed transfer method: Ambulatory Chair/bed transfer assist level: Touching or steadying assistance (Pt > 75%) Chair/bed transfer assistive device: Armrests, Medical sales representative     Max distance: 50 Assist level: Touching or steadying assistance (Pt > 75%)   Wheelchair   Type: Manual Max wheelchair distance: 160 Assist Level: Supervision or verbal cues  Cognition Comprehension Comprehension assist level: Understands complex 90% of the time/cues 10% of the time  Expression Expression assist level: Expresses complex 90% of the time/cues < 10% of the time  Social Interaction Social Interaction assist level: Interacts appropriately with others with medication or extra time (anti-anxiety, antidepressant).  Problem Solving Problem solving assist level: Solves basic problems with no assist  Memory Memory assist level: Recognizes or recalls 75 - 89% of the time/requires cueing 10 - 24% of the time     Medical Problem List and Plan: 1.  Right hemiplegia secondary to left pontine infarct             -Cont CIR PT, OT, Patient and family are aware of 07/15/2016 discharge  -making nice gains  2.  DVT Prophylaxis/Anticoagulation: Subcutaneous Lovenox. Monitor platelet counts of any signs of  bleeding 3. Pain Management: Hydrocodone as needed 4. Left humerus fracture 5 weeks ago after a fall. Shoulder sling, nonweightbearing. 5. Neuropsych: This patient is capable of making decisions on her own behalf. 6. Skin/Wound Care: Routine skin checks, pressure relief as appropriate 7. Fluids/Electrolytes/Nutrition: Routine I&O  Cr. WNL on 10/20 8. Hyperlipidemia. Lipitor 9. Left humerus fracture 5 weeks ago after a fall. Weightbearing as tolerated. Continue sling             -LUE elevation/compression to help control edema,More aggressive range of motion   -improved ROM/WB has helped with functional activities 10. HTN  Lisinopril 2.5 started on 10/21, increased on 10/23 to 5mg , no changes for now---consider increase to 10mg  Vitals:   07/04/16 0435 07/04/16 0817  BP: (!) 160/51 (!) 170/61  Pulse: 98   Resp: 17   Temp: 97.6 F (36.4 C)    11. Urinary retention  resolved, No burning with urination, no frequency at present 12.  Left wrist pain- dorsal trauma ~5wks ago, Evidence of chondrocalcinosis but no active pseudogout. Current symptoms are most likely due to immobility and stiffness LOS (Days) 8 A FACE TO FACE EVALUATION WAS PERFORMED  Orena Cavazos T 07/04/2016 9:05 AM

## 2016-07-04 NOTE — Progress Notes (Signed)
Physical Therapy Weekly Progress Note  Patient Details  Name: Melanie Hendricks MRN: [id number] Date of Birth: Jan 19, 1936  Beginning of progress report period: June 27, 2016 End of progress report period: July 04, 2016  Today's Date: 07/04/2016 PT Individual Time: 0800-0900 PT Individual Time Calculation (min): 60 min    Patient has met 4 of 4 short term goals.    Patient continues to demonstrate the following deficits: balance, strength, endurance, transfers, coordination on the RLE, and therefore will continue to benefit from skilled PT intervention to enhance overall performance with activity tolerance, balance, postural control, ability to compensate for deficits and coordination.  Patient progressing toward long term goals..  Continue plan of care.  PT Short Term Goals Week 1:  PT Short Term Goal 1 (Week 1): patient will transfer with mod Assist to R and L consistently.  PT Short Term Goal 1 - Progress (Week 1): Met PT Short Term Goal 2 (Week 1): patient will performed sit<>stand with min Assist.  PT Short Term Goal 2 - Progress (Week 1): Met PT Short Term Goal 3 (Week 1): Patient will ambulate 10f with mod Assist from PT.  PT Short Term Goal 3 - Progress (Week 1): Met PT Short Term Goal 4 (Week 1): Patient will perform WC mobility >1581fwith supervision Assist.  PT Short Term Goal 4 - Progress (Week 1): Met Week 2:  PT Short Term Goal 1 (Week 2): Patient will consistently perform all sit<>stand with Supervision Assist with LRAD PT Short Term Goal 2 (Week 2): Stand pivot transfer will consistently be performed with Supervision assist.  PT Short Term Goal 3 (Week 2): Patient will ambulate with RW with supervision Assist consistently >15051fPT Short Term Goal 4 (Week 2): Patient will improve Berg score by >7 points to indicate improved function, safety and balance.    Skilled Therapeutic Interventions/Progress Updates:     [facility]  Patient allowed to to tie shoes with supervision Assist from PT while keeping feet on floor. PT provided patient with 4 inch step to raise shoe off floor, patient noted to  Have improved stability with sitting balance, able to tie shoe tighter with step.   Gait training instructed by PT with overall supervision assist and min Assist x 1 to prevent LOB  for [id number] instructed in dynamic balance/gait training through obstacle course including stepping over 1 inch thresh hooldx4 and weave through 8 cones; supervision -min assist for gait through obstacles and mod cues for proper AD management. .   Curb management instructed by PT with RW and min assist and min-mod cues for AD management and proper step to gait pattern.   Bed mobility to 29 inch matteress x2; overall supervision Assist from PT.  PT provided  4 inch step on second attempt with increased ease and safety noted with use of step.   PT instructed patient in gait training back to room with supervision  Assist and use of RW x [id number] cues for awareness of obstacles on the R side.   Patient left sitting in recliner with call bell in reach and all needs met.     Therapy Documentation Precautions:  Precautions Precautions: Fall Precaution Comments: d/c sling LUE 10/24 Required Braces or Orthoses: Sling Restrictions Weight Bearing Restrictions: Yes LUE Weight Bearing: Weight bearing as tolerated Other Position/Activity Restrictions: D/c sling and WBAT and ROM 10/24 General:   Vital Signs: Therapy Vitals BP: (!) 170/61 (  patient in the middle of PT, will recheck) Pain: Pain Assessment Pain Assessment: 0-10 Pain Score: 3  Pain Type: Acute pain Pain Location: Shoulder Pain Orientation: Left Pain Descriptors / Indicators: Aching Pain Frequency: Intermittent Pain Onset: With Activity Pain Intervention(s): Medication (See eMAR)   See Function Navigator for Current Functional Status.  Therapy/Group: Individual  Therapy  Lorie Phenix 07/04/2016, 10:12 AM

## 2016-07-05 ENCOUNTER — Inpatient Hospital Stay (HOSPITAL_COMMUNITY): Payer: Medicare Other | Admitting: Occupational Therapy

## 2016-07-05 NOTE — Progress Notes (Signed)
[place] PHYSICAL MEDICINE & REHABILITATION     PROGRESS NOTE  Subjective/Complaints:  Slept better last night. Didn't use trazodone. Was able to stay up a little later before she fell asleep. Pain under control.   ROS: Pt denies fever, rash/itching, headache, blurred or double vision, nausea, vomiting, abdominal pain, diarrhea, chest pain, shortness of breath, palpitations, dysuria, dizziness, neck or back pain, bleeding, anxiety, or depression   Objective: Vital Signs: Blood pressure (!) 150/75, pulse 95, temperature 98.2 F (36.8 C), temperature source Oral, resp. rate 18, height 5\' 6"  (1.676 m), weight 108.3 kg (238 lb 12.1 oz), SpO2 97 %. No results found. No results for input(s): WBC, HGB, HCT, PLT in the last 72 hours.  Recent Labs  07/03/16 0551  CREATININE 0.62   CBG (last 3)  No results for input(s): GLUCAP in the last 72 hours.  Wt Readings from Last 3 Encounters:  06/26/16 108.3 kg (238 lb 12.1 oz)  06/24/16 109 kg (240 lb 6.4 oz)  05/14/16 90.7 kg (200 lb)    Physical Exam:  BP (!) 150/75   Pulse 95   Temp 98.2 F (36.8 C) (Oral)   Resp 18   Ht 5\' 6"  (1.676 m)   Wt 108.3 kg (238 lb 12.1 oz)   SpO2 97%   BMI 38.54 kg/m  Constitutional: She appears well-developed. NAD. Vital signs reviewed. Eyes: EOMare normal. No discharge.  Cardiovascular: Normal rateand regular rhythm.  Respiratory: Effort normaland breath sounds normal. No respiratory distress.  GI: Soft. Bowel sounds are normal. She exhibits no distension.  MusculoskeletalRight knee without evidence of effusion. Right ankle without evidence of effusion. No pain with range of motion. No limitation in range of motion. Left shoulder limited range of motion  Neurological: She is alertand oriented.   good insight and awareness  Motor: 4/5 right biceps, triceps, wrist and HI  4/5 right HF, 3+KE and 4+ ADF/PF  LUE limited by pain, ~2+/5.  LLE 5/5 prox to distal.  Musculoskeletal: LUE edema mild,  left wrist has no erythema or jt effusion, dorsal pain. Improving PROM Left shoulder Skin: Skin is warm and dry.  Psychiatric: She has a normal mood and affect. Her behavior is normal. Judgmentand thought contentnormal  Assessment/Plan: 1. Functional deficits secondary to left pontine infarct which require 3+ hours per day of interdisciplinary therapy in a comprehensive inpatient rehab setting. Physiatrist is providing close team supervision and 24 hour management of active medical problems listed below. Physiatrist and rehab team continue to assess barriers to discharge/monitor patient progress toward functional and medical goals.  Function:  Bathing Bathing position   Position: Shower  Bathing parts Body parts bathed by patient: Left arm, Chest, Abdomen, Front perineal area, Buttocks, Right upper leg, Left upper leg, Right lower leg, Left lower leg, Right arm Body parts bathed by helper: Back, Right arm  Bathing assist Assist Level: Touching or steadying assistance(Pt > 75%)      Upper Body Dressing/Undressing Upper body dressing   What is the patient wearing?: Button up shirt         Button up shirt - Perfomed by patient: Thread/unthread left sleeve, Pull shirt around back, Button/unbutton shirt, Thread/unthread right sleeve Button up shirt - Perfomed by helper: Thread/unthread right sleeve    Upper body assist Assist Level: Set up   Set up : To obtain clothing/put away  Lower Body Dressing/Undressing Lower body dressing   What is the patient wearing?: Pants, Socks, [employer], Shoes Underwear - Performed by patient:  Thread/unthread right underwear leg, Thread/unthread left underwear leg, Pull underwear up/down Underwear - Performed by helper: Pull underwear up/down Pants- Performed by patient: Thread/unthread right pants leg, Thread/unthread left pants leg, Pull pants up/down Pants- Performed by helper: Pull pants up/down   Non-skid slipper socks- Performed by helper:  Don/doff right sock, Don/doff left sock     Shoes - Performed by patient: Don/doff right shoe, Don/doff left shoe (shoes were already tied) Shoes - Performed by helper: Don/doff right shoe, Don/doff left shoe, Fasten right, Fasten left     TED Hose - Performed by patient: Don/doff right TED hose, Don/doff left TED hose TED Hose - Performed by helper: Don/doff right TED hose, Don/doff left TED hose  Lower body assist Assist for lower body dressing: Touching or steadying assistance (Pt > 75%)      Toileting Toileting   Toileting steps completed by patient: Adjust clothing prior to toileting, Performs perineal hygiene, Adjust clothing after toileting Toileting steps completed by helper: Adjust clothing after toileting Toileting Assistive Devices: Grab bar or rail  Toileting assist Assist level:  (mod assistance)   Transfers Chair/bed transfer   Chair/bed transfer method: Squat pivot Chair/bed transfer assist level: Touching or steadying assistance (Pt > 75%) Chair/bed transfer assistive device: Armrests, Medical sales representative     Max distance: 185ft  Assist level: Touching or steadying assistance (Pt > 75%)   Wheelchair   Type: Manual Max wheelchair distance: 160 Assist Level: Supervision or verbal cues  Cognition Comprehension Comprehension assist level: Understands complex 90% of the time/cues 10% of the time  Expression Expression assist level: Expresses complex 90% of the time/cues < 10% of the time  Social Interaction Social Interaction assist level: Interacts appropriately with others with medication or extra time (anti-anxiety, antidepressant).  Problem Solving Problem solving assist level: Solves basic problems with no assist  Memory Memory assist level: Recognizes or recalls 75 - 89% of the time/requires cueing 10 - 24% of the time     Medical Problem List and Plan: 1.  Right hemiplegia secondary to left pontine infarct             -Cont CIR PT, OT,  Patient and family are aware of 07/15/2016 discharge  -making gradual gains. Incorporation of LUE has helped  2.  DVT Prophylaxis/Anticoagulation: Subcutaneous Lovenox. Monitor platelet counts of any signs of bleeding 3. Pain Management: Hydrocodone as needed 5. Neuropsych: This patient is capable of making decisions on her own behalf. 6. Skin/Wound Care: Routine skin checks, pressure relief as appropriate 7. Fluids/Electrolytes/Nutrition: Routine I&O  Cr. WNL on 10/20 8. Hyperlipidemia. Lipitor 9. Left humerus fracture 5 weeks ago after a fall. Weightbearing as tolerated. Continue sling             -LUE elevation/compression to help control edema,More aggressive range of motion   -improved ROM/WB has helped with functional activities 10. HTN  Lisinopril 2.5 started on 10/21, increased on 10/23 to 5mg , no changes for now---consider increase to 10mg  Vitals:   07/05/16 0525 07/05/16 0738  BP: (!) 163/55 (!) 150/75  Pulse: 95   Resp: 18   Temp: 98.2 F (36.8 C)    11. Urinary retention  resolved 12.  Left wrist pain- dorsal trauma ~5wks ago, Evidence of chondrocalcinosis but no active pseudogout. Current symptoms are most likely due to immobility and stiffness   LOS (Days) 9 A FACE TO FACE EVALUATION WAS PERFORMED  Hubbert Landrigan T 07/05/2016 8:41 AM

## 2016-07-05 NOTE — Plan of Care (Signed)
Problem: RH Tub/Shower Transfers Goal: LTG Patient will perform tub/shower transfers w/assist (OT) LTG: Patient will perform tub/shower transfers with assist, with/without cues using equipment (OT)  Goals upgraded secondary to increased progress and pt allowed to integrate the LUE.

## 2016-07-05 NOTE — Progress Notes (Signed)
Occupational Therapy Session Note  Patient Details  Name: Melanie Hendricks MRN: [id number] Date of Birth: [birth year]  Today's Date: 07/05/2016 OT Individual Time: [id number] OT Individual Time Calculation (min): 57 min     Short Term Goals: Week 2:  OT Short Term Goal 1 (Week 2): Pt will demonstrate AROM left shoulder flexion to 80 degrees for greater independence with selfcare tasks.  OT Short Term Goal 2 (Week 2): Pt will manage clothing with supervision sit to stand 3 consecutive sessions.  OT Short Term Goal 3 (Week 2): Pt will complete all bathing with supervision sit to stand.  OT Short Term Goal 4 (Week 2): Pt will perform LB dressing supervision with AE PRN.    Skilled Therapeutic Interventions/Progress Updates:    Pt completed bathing and dressing sit to stand at the shower during session.  Pt ambulated to the shower without assistive device and min assist during session.  Bathing sit to stand with supervision.  Still needing slight min assist for washing under the right arm using the LUE but improved AROM noted for flexion with washing and drying hair.  Min assist for donning socks with total assist for TEDs.  She was able to [employer], button up shirt and all other LB clothing with supervision.  Pt left in bedside chair at end of session with family present.    Therapy Documentation Precautions:  Precautions Precautions: Fall Precaution Comments:   Required Braces or Orthoses: Sling Restrictions Weight Bearing Restrictions: Yes LUE Weight Bearing: Weight bearing as tolerated Other Position/Activity Restrictions: D/c sling and WBAT and ROM 10/24  Pain: Pain Assessment Pain Assessment: 0-10 Pain Score: 1  Pain Type: Acute pain Pain Location: Shoulder Pain Orientation: Left Pain Descriptors / Indicators: Aching Pain Frequency: Intermittent Pain Onset: On-going Pain Intervention(s): Medication (See eMAR) Multiple Pain Sites: No ADL: See Function Navigator  for Current Functional Status.   Therapy/Group: Individual Therapy  Lakechia Nay OTR/L 07/05/2016, 9:25 AM

## 2016-07-06 ENCOUNTER — Inpatient Hospital Stay (HOSPITAL_COMMUNITY): Payer: Medicare Other

## 2016-07-06 ENCOUNTER — Inpatient Hospital Stay (HOSPITAL_COMMUNITY): Payer: Medicare Other | Admitting: Occupational Therapy

## 2016-07-06 LAB — CBC WITH DIFFERENTIAL/PLATELET
BASOS ABS: 0 10*3/uL (ref 0.0–0.1)
BASOS PCT: 1 %
EOS ABS: 0.3 10*3/uL (ref 0.0–0.7)
Eosinophils Relative: 5 %
HEMATOCRIT: 42.1 % (ref 36.0–46.0)
HEMOGLOBIN: 13.6 g/dL (ref 12.0–15.0)
Lymphocytes Relative: 30 %
Lymphs Abs: 1.7 10*3/uL (ref 0.7–4.0)
MCH: 30.2 pg (ref 26.0–34.0)
MCHC: 32.3 g/dL (ref 30.0–36.0)
MCV: 93.6 fL (ref 78.0–100.0)
MONO ABS: 0.6 10*3/uL (ref 0.1–1.0)
Monocytes Relative: 11 %
NEUTROS ABS: 3.1 10*3/uL (ref 1.7–7.7)
NEUTROS PCT: 55 %
Platelets: 316 10*3/uL (ref 150–400)
RBC: 4.5 MIL/uL (ref 3.87–5.11)
RDW: 12.7 % (ref 11.5–15.5)
WBC: 5.8 10*3/uL (ref 4.0–10.5)

## 2016-07-06 MED ORDER — DOCUSATE SODIUM 100 MG PO CAPS
100.0000 mg | ORAL_CAPSULE | Freq: Two times a day (BID) | ORAL | Status: DC
  Administered 2016-07-06 – 2016-07-10 (×8): 100 mg via ORAL
  Filled 2016-07-06 (×8): qty 1

## 2016-07-06 NOTE — Progress Notes (Signed)
Occupational Therapy Session Note  Patient Details  Name: Melanie Hendricks MRN: [id number] Date of Birth: [birth year]  Today's Date: 07/06/2016 OT Individual Time: 0900-1000 and 1446-1559 OT Individual Time Calculation (min): 60 min and 73 min     Short Term Goals: Week 2:  OT Short Term Goal 1 (Week 2): Pt will demonstrate AROM left shoulder flexion to 80 degrees for greater independence with selfcare tasks.  OT Short Term Goal 2 (Week 2): Pt will manage clothing with supervision sit to stand 3 consecutive sessions.  OT Short Term Goal 3 (Week 2): Pt will complete all bathing with supervision sit to stand.  OT Short Term Goal 4 (Week 2): Pt will perform LB dressing supervision with AE PRN.    Skilled Therapeutic Interventions/Progress Updates:     Session 1: Upon entering the room, pt seated in wheelchair with family present in room. Pt propelled wheelchair with B UEs onto elevator and down to gift shop with increased time. Pt ambulating in gift shop on carpeted surface while navigating aisles with steady assistance and use of RW. Pt reaching with L and R UE at times to pick up items in various locations. OT educated pt and caregiver on using public restrooms and problem solving different sceneries. After seated rest, pt ambulating with RW into public restroom and managing the door as well as sitting on very low toilet with overall steady assistance. Pt given cues to turn body on toileting in order to utilize various grab bars based on placement. Pt able to stand with steady assistance. Pt propelled self via wheelchair back to room in same manner as above.   Session 2: Upon entering the room, pt seated in recliner chair with family present in the room. Pt with no c/o pain this session but requesting to perform self care this session. Pt ambulates with RW in room to obtain clothing items from dresser with steady assist as she has to squat in order to reach lowest drawer. Pt ambulated into  bathroom and performs shower transfer onto shower chair with use of grab bar and steady assistance. OT and pt discuss home set up and where she could dress/undress when showering. Pt engaged in bathing from seated position on chair with supervision for safety. Pt returning from bathroom to sit in recliner chair to dress. Pt able to cross L LE over knee in order to don shoe and attempted circle sitting to don on other foot but unable and needing assistance. Pt standing with steady assistance for LB clothing management. Pt returning to stand at sink to grooming tasks with supervision and use of RW. Pt returned to sit in recliner chair at end of session with call bell and all needed items within reach.    Therapy Documentation Precautions:  Precautions Precautions: Fall Precaution Comments:   Required Braces or Orthoses: Sling Restrictions Weight Bearing Restrictions: Yes LUE Weight Bearing: Weight bearing as tolerated Other Position/Activity Restrictions: D/c sling and WBAT and ROM 10/24  See Function Navigator for Current Functional Status.   Therapy/Group: Individual Therapy  Phineas Semen 07/06/2016, 4:26 PM

## 2016-07-06 NOTE — Progress Notes (Signed)
[place] PHYSICAL MEDICINE & REHABILITATION     PROGRESS NOTE  Subjective/Complaints:   No issues overnite  ROS: Pt denies fever, rash/itching, headache, blurred or double vision, nausea, vomiting, abdominal pain, diarrhea, chest pain, shortness of breath, palpitations, dysuria, dizziness, neck or back pain, bleeding, anxiety, or depression   Objective: Vital Signs: Blood pressure (!) 142/47, pulse 92, temperature 98.2 F (36.8 C), temperature source Oral, resp. rate 18, height 5\' 6"  (1.676 m), weight 108.3 kg (238 lb 12.1 oz), SpO2 99 %. No results found. No results for input(s): WBC, HGB, HCT, PLT in the last 72 hours. No results for input(s): NA, K, CL, GLUCOSE, BUN, CREATININE, CALCIUM in the last 72 hours.  Invalid input(s): CO CBG (last 3)  No results for input(s): GLUCAP in the last 72 hours.  Wt Readings from Last 3 Encounters:  06/26/16 108.3 kg (238 lb 12.1 oz)  06/24/16 109 kg (240 lb 6.4 oz)  05/14/16 90.7 kg (200 lb)    Physical Exam:  BP (!) 142/47 (BP Location: Right Arm)   Pulse 92   Temp 98.2 F (36.8 C) (Oral)   Resp 18   Ht 5\' 6"  (1.676 m)   Wt 108.3 kg (238 lb 12.1 oz)   SpO2 99%   BMI 38.54 kg/m  Constitutional: She appears well-developed. NAD. Vital signs reviewed. Eyes: EOMare normal. No discharge.  Cardiovascular: Normal rateand regular rhythm.  Respiratory: Effort normaland breath sounds normal. No respiratory distress.  GI: Soft. Bowel sounds are normal. She exhibits no distension.  MusculoskeletalNo pain with range of motion. No limitation in range of motion. Left shoulder limited range of motion  Neurological: She is alertand oriented.   good insight and awareness  Motor: 4/5 right biceps, triceps, wrist and HI  4/5 right HF, 3+KE and 4+ ADF/PF  LUE limited by pain, ~2+/5.  LLE 5/5 prox to distal.  Musculoskeletal: LUE edema mild, left wrist has no erythema or jt effusion, dorsal pain. Improving PROM Left shoulder Skin: Skin is  warm and dry.  Psychiatric: She has a normal mood and affect. Her behavior is normal. Judgmentand thought contentnormal  Assessment/Plan: 1. Functional deficits secondary to left pontine infarct which require 3+ hours per day of interdisciplinary therapy in a comprehensive inpatient rehab setting. Physiatrist is providing close team supervision and 24 hour management of active medical problems listed below. Physiatrist and rehab team continue to assess barriers to discharge/monitor patient progress toward functional and medical goals.  Function:  Bathing Bathing position   Position: Shower  Bathing parts Body parts bathed by patient: Left arm, Chest, Abdomen, Front perineal area, Buttocks, Right upper leg, Left upper leg, Right lower leg, Left lower leg, Right arm Body parts bathed by helper: Back  Bathing assist Assist Level: Touching or steadying assistance(Pt > 75%)      Upper Body Dressing/Undressing Upper body dressing   What is the patient wearing?: Button up shirt, Pull over shirt/dress     Pull over shirt/dress - Perfomed by patient: Thread/unthread right sleeve, Thread/unthread left sleeve, Put head through opening, Pull shirt over trunk   Button up shirt - Perfomed by patient: Thread/unthread left sleeve, Pull shirt around back, Button/unbutton shirt, Thread/unthread right sleeve Button up shirt - Perfomed by helper: Thread/unthread right sleeve    Upper body assist Assist Level: Supervision or verbal cues   Set up : To obtain clothing/put away  Lower Body Dressing/Undressing Lower body dressing   What is the patient wearing?: Pants, Socks, [employer], Shoes Underwear -  Performed by patient: Thread/unthread right underwear leg, Thread/unthread left underwear leg, Pull underwear up/down Underwear - Performed by helper: Pull underwear up/down Pants- Performed by patient: Thread/unthread right pants leg, Thread/unthread left pants leg, Pull pants up/down Pants- Performed  by helper: Pull pants up/down   Non-skid slipper socks- Performed by helper: Don/doff right sock, Don/doff left sock Socks - Performed by patient: Don/doff right sock, Don/doff left sock   Shoes - Performed by patient: Don/doff right shoe, Don/doff left shoe Shoes - Performed by helper: Don/doff right shoe, Don/doff left shoe, Fasten right, Fasten left     TED Hose - Performed by patient: Don/doff right TED hose, Don/doff left TED hose TED Hose - Performed by helper: Don/doff right TED hose, Don/doff left TED hose  Lower body assist Assist for lower body dressing: Touching or steadying assistance (Pt > 75%)      Toileting Toileting   Toileting steps completed by patient: Adjust clothing prior to toileting, Performs perineal hygiene, Adjust clothing after toileting Toileting steps completed by helper: Adjust clothing after toileting Toileting Assistive Devices: Grab bar or rail  Toileting assist Assist level:  (mod assistance)   Transfers Chair/bed transfer   Chair/bed transfer method: Squat pivot Chair/bed transfer assist level: Touching or steadying assistance (Pt > 75%) Chair/bed transfer assistive device: Armrests, Medical sales representative     Max distance: 138ft  Assist level: Touching or steadying assistance (Pt > 75%)   Wheelchair   Type: Manual Max wheelchair distance: 160 Assist Level: Supervision or verbal cues  Cognition Comprehension Comprehension assist level: Understands complex 90% of the time/cues 10% of the time  Expression Expression assist level: Expresses complex 90% of the time/cues < 10% of the time  Social Interaction Social Interaction assist level: Interacts appropriately with others with medication or extra time (anti-anxiety, antidepressant).  Problem Solving Problem solving assist level: Solves basic problems with no assist  Memory Memory assist level: Recognizes or recalls 75 - 89% of the time/requires cueing 10 - 24% of the time      Medical Problem List and Plan: 1.  Right hemiplegia secondary to left pontine infarct             -Cont CIR PT, OT, Very steady with walker in room, will likely be able to move up d/c date  -making gradual gains. Incorporation of LUE has helped  2.  DVT Prophylaxis/Anticoagulation: Subcutaneous Lovenox. Recheck platelet counts, no signs of bleeding,  3. Pain Management: Hydrocodone as needed 5. Neuropsych: This patient is capable of making decisions on her own behalf. 6. Skin/Wound Care: Routine skin checks, pressure relief as appropriate 7. Fluids/Electrolytes/Nutrition: Routine I&O  Cr. WNL on 10/20 8. Hyperlipidemia. Lipitor 9. Left humerus fracture 6 weeks ago after a fall. Weightbearing as tolerated. Continue sling             -LUE elevation/compression to help control edema,More aggressive range of motion   -improved ROM/WB has helped with functional activities 10. HTN  Lisinopril 2.5 started on 10/21, increased on 10/23 to 5mg , currently in a good range Vitals:   07/05/16 1327 07/06/16 0534  BP: (!) 140/51 (!) 142/47  Pulse: 98 92  Resp: 18 18  Temp: 97.6 F (36.4 C) 98.2 F (36.8 C)   11. Urinary retention  resolved 12.  Left wrist pain- improved   LOS (Days) 10 A FACE TO FACE EVALUATION WAS PERFORMED  Melanie Hendricks 07/06/2016 7:12 AM

## 2016-07-06 NOTE — Progress Notes (Signed)
Physical Therapy Note  Patient Details  Name: Melanie Hendricks MRN: [id number] Date of Birth: July 29, 1936 Today's Date: 07/06/2016  0800--0900, 60 min individual tx Pain: none reported  Gait in room with RW to tiolet and sink with supervision.  PT observed dtr in law Hannah guarding pt in [place], safely.  Gait to/from gym.  Instructed pt and family in self stretching bil hamstrings and heel cords in sitting with foot stool; hand out provided. Therapeutic acivities in biased to R sitting and standing requiring R/L pincher grasp of Rx bottles and clothes pins.  RLE ataxia felt with SLS, but no LOB.  While standing on wedge, pt balanced with supervision, and progressed to tossing a ball over 3' distance without LOB.  Gait to return to room, kicking block with alternating feet, without AD, 2  Instances of R knee buckling while kicking with LLE; balance recovered independently. . Pt left resting in w/c with all needs within reach.    Keishla Oyer 07/06/2016, 7:49 AM

## 2016-07-07 ENCOUNTER — Inpatient Hospital Stay (HOSPITAL_COMMUNITY): Payer: Medicare Other

## 2016-07-07 ENCOUNTER — Inpatient Hospital Stay (HOSPITAL_COMMUNITY): Payer: Medicare Other | Admitting: Occupational Therapy

## 2016-07-07 ENCOUNTER — Inpatient Hospital Stay (HOSPITAL_COMMUNITY): Payer: Medicare Other | Admitting: Physical Therapy

## 2016-07-07 NOTE — Progress Notes (Signed)
Physical Therapy Session Note  Patient Details  Name: Melanie Hendricks MRN: [id number] Date of Birth: September 15, 1935  Today's Date: 07/07/2016 PT Individual Time: 1108-1201 PT Individual Time Calculation (min): 53 min    Short Term Goals: Week 2:  PT Short Term Goal 1 (Week 2): Patient will consistently perform all sit<>stand with Supervision Assist with LRAD PT Short Term Goal 2 (Week 2): Stand pivot transfer will consistently be performed with Supervision assist.  PT Short Term Goal 3 (Week 2): Patient will ambulate with RW with supervision Assist consistently >177ft  PT Short Term Goal 4 (Week 2): Patient will improve Berg score by >7 points to indicate improved function, safety and balance.   Skilled Therapeutic Interventions/Progress Updates:    Pt received in recliner with daughter present for session; pt agreeable to tx denying c/o pain at rest but noting 2/10 LUE pain with activity (pt premedicate). Gait training room>gym with RW & supervision with pt demonstrating increased gait speed. Session focused on BLE strengthening & neuro re-ed with pt utilizing cybex kinetron in sitting & standing up to 30-40 cm/sec with steady assist in standing, cuing for upright posture & forward gaze. On mat table pt performed bridging with adductor hold and RLE single leg bridging with verbal & tactile cuing for increased RLE muscle activation. Educated pt on pursed lip breathing & breathing pattern while performing exercises to prevent shortness of breath. Utilized dynavision in sitting with LUE only for ROM & strengthening; pt with minimal shoulder flexion, abduction. Pt ambulated BI gym>room with RW & supervision overall & increased gait speed. Pt required cuing for hand placement for sit<>stand for increased safety. At end of session pt left sitting in recliner with BLE, all needs within reach & daughter present.  Therapy Documentation Precautions:  Precautions Precautions: Fall Precaution Comments:    Required Braces or Orthoses: Sling Restrictions Weight Bearing Restrictions: Yes LUE Weight Bearing: Weight bearing as tolerated Other Position/Activity Restrictions: D/c sling and WBAT and ROM 10/24   See Function Navigator for Current Functional Status.   Therapy/Group: Individual Therapy  Waunita Schooner 07/07/2016, 12:11 PM

## 2016-07-07 NOTE — Progress Notes (Signed)
Occupational Therapy Session Note  Patient Details  Name: Melanie Hendricks MRN: [id number] Date of Birth: 05/11/1936  Today's Date: 07/07/2016 OT Individual Time: 0800-0900 OT Individual Time Calculation (min): 60 min    Skilled Therapeutic Interventions/Progress Updates:   1:1 Pt declined bathing and dressing this morning. Performed bed mobility (getting in and out) from bed at ~30 inches heigh. Pt able to back up to bed and get into bed and get out with supervision. Pt able to ambulated with close supervision with RW to car in ortho gym. Pt transferred into car at 22 inch floor to seat height with supervision with RW. Pt's daughter from [place] present for session and took notes.   Focus on continued AROM of left UE (trying to keep pain at 3/10). Performed pendulum exercises in standing with gravity assisting movement. Pt able to maintain balance during tasks with steadying A.  Continued left UE/LE coordination on the Nustep on resistance level 2.  Functional ambulation in the gym without AD with steadying A at slow pace. Performed 4 min on air bike (SCI fit) in standing with close supervision  Pt ambulated back to her room without AD with steadying A at a slow pace.  Left in recliner with RN present   Therapy Documentation Precautions:  Precautions Precautions: Fall Precaution Comments:   Required Braces or Orthoses: Sling Restrictions Weight Bearing Restrictions: Yes LUE Weight Bearing: Weight bearing as tolerated Other Position/Activity Restrictions: D/c sling and WBAT and ROM 10/24 Pain:  no c/o pain  See Function Navigator for Current Functional Status.   Therapy/Group: Individual Therapy  Willeen Cass [facility] 07/07/2016, 2:32 PM

## 2016-07-07 NOTE — Progress Notes (Signed)
[place] PHYSICAL MEDICINE & REHABILITATION     PROGRESS NOTE  Subjective/Complaints:  No issues overnite  ROS: Pt denies fever, rash/itching, headache, blurred or double vision, nausea, vomiting, abdominal pain, diarrhea, chest pain, shortness of breath, palpitations, dysuria, dizziness, neck or back pain, bleeding, anxiety, or depression   Objective: Vital Signs: Blood pressure (!) 152/61, pulse 99, temperature 98 F (36.7 C), temperature source Oral, resp. rate 18, height 5\' 6"  (1.676 m), weight 108.3 kg (238 lb 12.1 oz), SpO2 96 %. No results found.  Recent Labs  07/06/16 0753  WBC 5.8  HGB 13.6  HCT 42.1  PLT 316   No results for input(s): NA, K, CL, GLUCOSE, BUN, CREATININE, CALCIUM in the last 72 hours.  Invalid input(s): CO CBG (last 3)  No results for input(s): GLUCAP in the last 72 hours.  Wt Readings from Last 3 Encounters:  06/26/16 108.3 kg (238 lb 12.1 oz)  06/24/16 109 kg (240 lb 6.4 oz)  05/14/16 90.7 kg (200 lb)    Physical Exam:  BP (!) 152/61 (BP Location: Right Arm)   Pulse 99   Temp 98 F (36.7 C) (Oral)   Resp 18   Ht 5\' 6"  (1.676 m)   Wt 108.3 kg (238 lb 12.1 oz)   SpO2 96%   BMI 38.54 kg/m  Constitutional: She appears well-developed. NAD. Vital signs reviewed. Eyes: EOMare normal. No discharge.  Cardiovascular: Normal rateand regular rhythm.  Respiratory: Effort normaland breath sounds normal. No respiratory distress.  GI: Soft. Bowel sounds are normal. She exhibits no distension.  MusculoskeletalNo pain with range of motion. No limitation in range of motion. Left shoulder limited range of motion  Neurological: She is alertand oriented.   good insight and awareness  Motor: 4/5 right biceps, triceps, wrist and HI  4/5 right HF, 3+KE and 4+ ADF/PF  LUE limited by pain, ~2+/5.  LLE 5/5 prox to distal.  Musculoskeletal: LUE edema mild, left wrist has no erythema or jt effusion, dorsal pain. Improving PROM Left shoulder Skin: Skin  is warm and dry.  Psychiatric: She has a normal mood and affect. Her behavior is normal. Judgmentand thought contentnormal  Assessment/Plan: 1. Functional deficits secondary to left pontine infarct which require 3+ hours per day of interdisciplinary therapy in a comprehensive inpatient rehab setting. Physiatrist is providing close team supervision and 24 hour management of active medical problems listed below. Physiatrist and rehab team continue to assess barriers to discharge/monitor patient progress toward functional and medical goals.  Function:  Bathing Bathing position   Position: Shower  Bathing parts Body parts bathed by patient: Left arm, Chest, Abdomen, Front perineal area, Buttocks, Right upper leg, Left upper leg, Right lower leg, Left lower leg, Right arm Body parts bathed by helper: Back  Bathing assist Assist Level: Supervision or verbal cues      Upper Body Dressing/Undressing Upper body dressing   What is the patient wearing?: Button up shirt, Pull over shirt/dress     Pull over shirt/dress - Perfomed by patient: Thread/unthread right sleeve, Thread/unthread left sleeve, Put head through opening, Pull shirt over trunk   Button up shirt - Perfomed by patient: Thread/unthread left sleeve, Pull shirt around back, Button/unbutton shirt, Thread/unthread right sleeve Button up shirt - Perfomed by helper: Thread/unthread right sleeve    Upper body assist Assist Level: Supervision or verbal cues   Set up : To obtain clothing/put away  Lower Body Dressing/Undressing Lower body dressing   What is the patient wearing?: Pants, Shoes,  Underwear Underwear - Performed by patient: Thread/unthread right underwear leg, Thread/unthread left underwear leg, Pull underwear up/down Underwear - Performed by helper: Pull underwear up/down Pants- Performed by patient: Thread/unthread right pants leg, Thread/unthread left pants leg, Pull pants up/down Pants- Performed by helper: Pull  pants up/down   Non-skid slipper socks- Performed by helper: Don/doff right sock, Don/doff left sock Socks - Performed by patient: Don/doff right sock, Don/doff left sock   Shoes - Performed by patient: Don/doff left shoe, Don/doff right shoe Shoes - Performed by helper: Don/doff right shoe, Don/doff left shoe, Fasten right, Fasten left     TED Hose - Performed by patient: Don/doff right TED hose, Don/doff left TED hose TED Hose - Performed by helper: Don/doff right TED hose, Don/doff left TED hose  Lower body assist Assist for lower body dressing: Touching or steadying assistance (Pt > 75%)      Toileting Toileting   Toileting steps completed by patient: Adjust clothing prior to toileting, Performs perineal hygiene, Adjust clothing after toileting Toileting steps completed by helper: Adjust clothing after toileting Toileting Assistive Devices: Grab bar or rail  Toileting assist Assist level: Touching or steadying assistance (Pt.75%)   Transfers Chair/bed transfer   Chair/bed transfer method: Ambulatory Chair/bed transfer assist level: Supervision or verbal cues Chair/bed transfer assistive device: Medical sales representative     Max distance: 200 Assist level: Supervision or verbal cues   Wheelchair   Type: Manual Max wheelchair distance: 160 Assist Level: Supervision or verbal cues  Cognition Comprehension Comprehension assist level: Understands basic 90% of the time/cues < 10% of the time  Expression Expression assist level: Expresses basic 90% of the time/requires cueing < 10% of the time.  Social Interaction Social Interaction assist level: Interacts appropriately with others - No medications needed.  Problem Solving Problem solving assist level: Solves complex 90% of the time/cues < 10% of the time  Memory Memory assist level: Recognizes or recalls 90% of the time/requires cueing < 10% of the time     Medical Problem List and Plan: 1.  Right hemiplegia  secondary to left pontine infarct             -Cont CIR PT, OT, Very steady with walker in room, will likely be able to move up d/c date  -making gradual gains. Incorporation of LUE has helped  2.  DVT Prophylaxis/Anticoagulation: Subcutaneous Lovenox. Recheck platelet counts, no signs of bleeding,  3. Pain Management: Hydrocodone as needed 5. Neuropsych: This patient is capable of making decisions on her own behalf. 6. Skin/Wound Care: Routine skin checks, pressure relief as appropriate 7. Fluids/Electrolytes/Nutrition: Routine I&O  Cr. WNL on 10/20 8. Hyperlipidemia. Lipitor 9. Left humerus fracture 6 weeks ago after a fall. Weightbearing as tolerated. Continue sling             -LUE elevation/compression to help control edema,More aggressive range of motion   -improved ROM/WB has helped with functional activities 10. HTN  Lisinopril 2.5 started on 10/21, increased on 10/23 to 5mg , currently in a good range Vitals:   07/06/16 1650 07/07/16 0533  BP: (!) 160/61 (!) 152/61  Pulse: (!) 106 99  Resp: 20 18  Temp: 98 F (36.7 C) 98 F (36.7 C)   11. Urinary retention  resolved    LOS (Days) 11 A FACE TO FACE EVALUATION WAS PERFORMED  Georgana Romain E 07/07/2016 7:28 AM

## 2016-07-07 NOTE — Progress Notes (Signed)
Social Work Patient ID: Melanie Hendricks, female   DOB: 06/26/1936, 80 y.o.   MRN: [id number]  Met with pt and daughter to discuss PCP and awaiting return calls from several practices. Pt is pleased with the progress She is making and feels her arm is getting stronger and is able to move it more. She knows this will take time to heal. She reports her son and daughter in-law are going to be taking some time off and then  Hopefully she will be able to stay alone while they are working. Her daughter goes back next Monday to [place]. Will continue to work on discharge needs.

## 2016-07-07 NOTE — Progress Notes (Signed)
Physical Therapy Session Note  Patient Details  Name: Melanie Hendricks MRN: [id number] Date of Birth: [birth year]  Today's Date: 07/07/2016 PT Individual Time: 0905-0930 PT Individual Time Calculation (min): 25 min    Short Term Goals: Week 1:  PT Short Term Goal 1 (Week 1): patient will transfer with mod Assist to R and L consistently.  PT Short Term Goal 1 - Progress (Week 1): Met PT Short Term Goal 2 (Week 1): patient will performed sit<>stand with min Assist.  PT Short Term Goal 2 - Progress (Week 1): Met PT Short Term Goal 3 (Week 1): Patient will ambulate 57f with mod Assist from PT.  PT Short Term Goal 3 - Progress (Week 1): Met PT Short Term Goal 4 (Week 1): Patient will perform WC mobility >1559fwith supervision Assist.  PT Short Term Goal 4 - Progress (Week 1): Met Week 2:  PT Short Term Goal 1 (Week 2): Patient will consistently perform all sit<>stand with Supervision Assist with LRAD PT Short Term Goal 2 (Week 2): Stand pivot transfer will consistently be performed with Supervision assist.  PT Short Term Goal 3 (Week 2): Patient will ambulate with RW with supervision Assist consistently >15025fPT Short Term Goal 4 (Week 2): Patient will improve Berg score by >7 points to indicate improved function, safety and balance.   Skilled Therapeutic Interventions/Progress Updates:    Session focused on otago HEP to address balance and strength deficits. Focused on RLE today due to this being the weaker side. Pt completed 15 reps of Bilateral LAQ with 5 second hold, standing hip abduction, standing hamstring curls, heel/toe raises and mini squats. 10 reps of sit <> stands without use of hands with focus on eccentric control and slowed movement patterns. Handout given and reviewed with patient and daughter. Pt performed short distance gait and transfers in room with close supervision/steadying assist without AD.  Therapy Documentation Precautions:  Precautions Precautions:  Fall Precaution Comments:   Required Braces or Orthoses: Sling Restrictions Weight Bearing Restrictions: Yes LUE Weight Bearing: Weight bearing as tolerated Other Position/Activity Restrictions: D/c sling and WBAT and ROM 10/24 Pain: No c/o pain.    See Function Navigator for Current Functional Status.   Therapy/Group: Individual Therapy  GraCanary BrimeIvory BroadT, DPT  07/07/2016, 9:34 AM

## 2016-07-07 NOTE — Progress Notes (Signed)
Occupational Therapy Session Note  Patient Details  Name: Melanie Hendricks MRN: [id number] Date of Birth: 1936/01/03  Today's Date: 07/07/2016 OT Individual Time: 7106-2694 OT Individual Time Calculation (min): 45 min     Short Term Goals:Week 1:  OT Short Term Goal 1 (Week 1): Pt will demonstrate the ability to don and doff shoulder sling independently. OT Short Term Goal 1 - Progress (Week 1): Met OT Short Term Goal 2 (Week 1): Pt will perform shower transfer with min A in order to decrease level of assistance with self care.  OT Short Term Goal 2 - Progress (Week 1): Met OT Short Term Goal 3 (Week 1): Pt will perform LB dressing with mod A in order to decrease level of assist with self care.  OT Short Term Goal 3 - Progress (Week 1): Met OT Short Term Goal 4 (Week 1): Pt will perform demonstrate ability to maintain NWB on L UE without verbal cues for 2 consecutive sessions.   OT Short Term Goal 4 - Progress (Week 1): Met Week 2:  OT Short Term Goal 1 (Week 2): Pt will demonstrate AROM left shoulder flexion to 80 degrees for greater independence with selfcare tasks.  OT Short Term Goal 2 (Week 2): Pt will manage clothing with supervision sit to stand 3 consecutive sessions.  OT Short Term Goal 3 (Week 2): Pt will complete all bathing with supervision sit to stand.  OT Short Term Goal 4 (Week 2): Pt will perform LB dressing supervision with AE PRN.        Skilled Therapeutic Interventions/Progress Updates:    Pt seen this session to work on dynamic balance, RLE stability/ strength, and LUE ROM.  Pt practiced controlled sit>< stands without UE support, standing with maintaining balance with trunk rotation, reaching R arm overhead and to L to challenge stability. With steadying A, alternating steps forward and laterally. Pt was initially dragging L foot from abd into center. Worked on balancing on R  Foot and then fully stepping L foot back into center. Pt did very well with activity. With  hands on RW for support, alt knee lifts and heel raises.   Pt stated she felt more confident with each repetition of exercise. Pt worked on L shoulder AROM in gravity eliminated plane with use of UE Ranger. Pt used it for 10 minutes for rotation, sh flex/ext, sh abd/add. She stated she really liked the exercise and felt her shoulder was loosening up quite a bit.  Left ranger in room so pt could use it after dinner tonight.  Pt in room with her daughter with all needs met.   Therapy Documentation Precautions: Precautions Precautions: Fall Precaution Comments:   Required Braces or Orthoses: Sling Restrictions Weight Bearing Restrictions: Yes LUE Weight Bearing: Weight bearing as tolerated Other Position/Activity Restrictions: D/c sling and WBAT and ROM 10/24    Vital Signs: Therapy Vitals Temp: 98 F (36.7 C) Temp Source: Oral Pulse Rate: 99 Resp: 18 BP: (!) 151/67 Patient Position (if appropriate): Lying Oxygen Therapy SpO2: 96 % O2 Device: Not Delivered Pain: Pain Assessment Pain Assessment: 0-10 Pain Score: Asleep Pain Type: Chronic pain Pain Location: Arm Pain Orientation: Left Pain Intervention(s): Medication (See eMAR) ADL:   See Function Navigator for Current Functional Status.   Therapy/Group: Individual Therapy  Briannie Gutierrez 07/07/2016, 9:24 AM

## 2016-07-08 ENCOUNTER — Inpatient Hospital Stay (HOSPITAL_COMMUNITY): Payer: Medicare Other | Admitting: Occupational Therapy

## 2016-07-08 ENCOUNTER — Inpatient Hospital Stay (HOSPITAL_COMMUNITY): Payer: Medicare Other

## 2016-07-08 NOTE — Progress Notes (Signed)
At [facility], patient reports, "i'm not feeling well." Generalized complaint, unable to be specific. Patient seems to think it started after bathroom was cleaned or "maybe was just tired". PRN tylenol and trazodone given, per patient request. Feels better this AM. Cornell Barman

## 2016-07-08 NOTE — Progress Notes (Addendum)
[place] PHYSICAL MEDICINE & REHABILITATION     PROGRESS NOTE  Subjective/Complaints:  Still has left shoulder pain with ROM , not relieved with tylenol  ROS: Pt denies fever, rash/itching, headache, blurred or double vision, nausea, vomiting, abdominal pain, diarrhea, chest pain, shortness of breath, palpitations, dysuria, dizziness, neck or back pain, bleeding, anxiety, or depression   Objective: Vital Signs: Blood pressure (!) 140/50, pulse 95, temperature 97.5 F (36.4 C), temperature source Oral, resp. rate 18, height '5\' 6"'  (1.676 m), weight 108.3 kg (238 lb 12.1 oz), SpO2 98 %. No results found.  Recent Labs  07/06/16 0753  WBC 5.8  HGB 13.6  HCT 42.1  PLT 316   No results for input(s): NA, K, CL, GLUCOSE, BUN, CREATININE, CALCIUM in the last 72 hours.  Invalid input(s): CO CBG (last 3)  No results for input(s): GLUCAP in the last 72 hours.  Wt Readings from Last 3 Encounters:  06/26/16 108.3 kg (238 lb 12.1 oz)  06/24/16 109 kg (240 lb 6.4 oz)  05/14/16 90.7 kg (200 lb)    Physical Exam:  BP (!) 140/50 (BP Location: Right Arm)   Pulse 95   Temp 97.5 F (36.4 C) (Oral)   Resp 18   Ht '5\' 6"'  (1.676 m)   Wt 108.3 kg (238 lb 12.1 oz)   SpO2 98%   BMI 38.54 kg/m  Constitutional: She appears well-developed. NAD. Vital signs reviewed. Eyes: EOMare normal. No discharge.  Cardiovascular: Normal rateand regular rhythm.  Respiratory: Effort normaland breath sounds normal. No respiratory distress.  GI: Soft. Bowel sounds are normal. She exhibits no distension.  MusculoskeletalNo pain with range of motion. No limitation in range of motion. Left shoulder limited range of motion  Neurological: She is alertand oriented.   good insight and awareness  Motor: 4/5 right biceps, triceps, wrist and HI  4/5 right HF, 3+KE and 4+ ADF/PF  LUE limited by pain, ~2+/5.  LLE 5/5 prox to distal.  Musculoskeletal: Left shoulder abd to 90deg with end range pain   Skin: Skin is  warm and dry.  Psychiatric: She has a normal mood and affect. Her behavior is normal. Judgmentand thought contentnormal  Assessment/Plan: 1. Functional deficits secondary to left pontine infarct which require 3+ hours per day of interdisciplinary therapy in a comprehensive inpatient rehab setting. Physiatrist is providing close team supervision and 24 hour management of active medical problems listed below. Physiatrist and rehab team continue to assess barriers to discharge/monitor patient progress toward functional and medical goals.  Function:  Bathing Bathing position   Position: Shower  Bathing parts Body parts bathed by patient: Left arm, Chest, Abdomen, Front perineal area, Buttocks, Right upper leg, Left upper leg, Right lower leg, Left lower leg, Right arm Body parts bathed by helper: Back  Bathing assist Assist Level: Supervision or verbal cues      Upper Body Dressing/Undressing Upper body dressing   What is the patient wearing?: Button up shirt, Pull over shirt/dress     Pull over shirt/dress - Perfomed by patient: Thread/unthread right sleeve, Thread/unthread left sleeve, Put head through opening, Pull shirt over trunk   Button up shirt - Perfomed by patient: Thread/unthread left sleeve, Pull shirt around back, Button/unbutton shirt, Thread/unthread right sleeve Button up shirt - Perfomed by helper: Thread/unthread right sleeve    Upper body assist Assist Level: Supervision or verbal cues   Set up : To obtain clothing/put away  Lower Body Dressing/Undressing Lower body dressing   What is the patient  wearing?: Pants, Shoes, Underwear Underwear - Performed by patient: Thread/unthread right underwear leg, Thread/unthread left underwear leg, Pull underwear up/down Underwear - Performed by helper: Pull underwear up/down Pants- Performed by patient: Thread/unthread right pants leg, Thread/unthread left pants leg, Pull pants up/down Pants- Performed by helper: Pull pants  up/down   Non-skid slipper socks- Performed by helper: Don/doff right sock, Don/doff left sock Socks - Performed by patient: Don/doff right sock, Don/doff left sock   Shoes - Performed by patient: Don/doff left shoe, Don/doff right shoe Shoes - Performed by helper: Don/doff right shoe, Don/doff left shoe, Fasten right, Fasten left     TED Hose - Performed by patient: Don/doff right TED hose, Don/doff left TED hose TED Hose - Performed by helper: Don/doff right TED hose, Don/doff left TED hose  Lower body assist Assist for lower body dressing: Touching or steadying assistance (Pt > 75%)      Toileting Toileting   Toileting steps completed by patient: Adjust clothing prior to toileting, Performs perineal hygiene, Adjust clothing after toileting Toileting steps completed by helper: Adjust clothing after toileting Toileting Assistive Devices: Grab bar or rail  Toileting assist Assist level: Touching or steadying assistance (Pt.75%)   Transfers Chair/bed transfer   Chair/bed transfer method: Ambulatory Chair/bed transfer assist level: Supervision or verbal cues Chair/bed transfer assistive device: Medical sales representative     Max distance: 200 ft Assist level: Supervision or verbal cues   Wheelchair   Type: Manual Max wheelchair distance: 160 Assist Level: Supervision or verbal cues  Cognition Comprehension Comprehension assist level: Understands complex 90% of the time/cues 10% of the time  Expression Expression assist level: Expresses complex 90% of the time/cues < 10% of the time  Social Interaction Social Interaction assist level: Interacts appropriately with others with medication or extra time (anti-anxiety, antidepressant).  Problem Solving Problem solving assist level: Solves basic problems with no assist  Memory Memory assist level: Recognizes or recalls 75 - 89% of the time/requires cueing 10 - 24% of the time     Medical Problem List and Plan: 1.  Right  hemiplegia secondary to left pontine infarct             -Cont CIR PT, OT, Very steady with walker in room, will likely be able to move up d/c date  -Team conference today please see physician documentation under team conference tab, met with team face-to-face to discuss problems,progress, and goals. Formulized individual treatment plan based on medical history, underlying problem and comorbidities.  2.  DVT Prophylaxis/Anticoagulation: Subcutaneous Lovenox. Recheck platelet counts, no signs of bleeding,  3. Pain Management: Hydrocodone as needed, this is taken prior to PT/OT for L Shoulder pain 5. Neuropsych: This patient is capable of making decisions on her own behalf. 6. Skin/Wound Care: Routine skin checks, pressure relief as appropriate 7. Fluids/Electrolytes/Nutrition: Routine I&O  Cr. WNL on 10/20 8. Hyperlipidemia. Lipitor 9. Left humerus fracture 7 weeks ago after a fall.Left shoulder contracture Weightbearing as tolerated. Continue sling             -LUE elevation/compression to help control edema,More aggressive range of motion   -improved ROM/WB has helped with functional activities 10. HTN  Lisinopril 2.5 started on 10/21, increased on 10/23 to 55m, currently in a good range Vitals:   07/07/16 2025 07/08/16 0541  BP: (!) 151/48 (!) 140/50  Pulse: 98 95  Resp: 18 18  Temp: 97.9 F (36.6 C) 97.5 F (36.4 C)  LOS (Days) 12 A FACE TO FACE EVALUATION WAS PERFORMED  Charlett Blake 07/08/2016 7:41 AM

## 2016-07-08 NOTE — Progress Notes (Signed)
Occupational Therapy Session Note  Patient Details  Name: Melanie Hendricks MRN: [id number] Date of Birth: 03/14/1936  Today's Date: 07/08/2016 OT Individual Time: 1478-2956 OT Individual Time Calculation (min): 29 min     Short Term Goals: Week 1:  OT Short Term Goal 1 (Week 1): Pt will demonstrate the ability to don and doff shoulder sling independently. OT Short Term Goal 1 - Progress (Week 1): Met OT Short Term Goal 2 (Week 1): Pt will perform shower transfer with min A in order to decrease level of assistance with self care.  OT Short Term Goal 2 - Progress (Week 1): Met OT Short Term Goal 3 (Week 1): Pt will perform LB dressing with mod A in order to decrease level of assist with self care.  OT Short Term Goal 3 - Progress (Week 1): Met OT Short Term Goal 4 (Week 1): Pt will perform demonstrate ability to maintain NWB on L UE without verbal cues for 2 consecutive sessions.   OT Short Term Goal 4 - Progress (Week 1): Met  Skilled Therapeutic Interventions/Progress Updates:    Pt completed LUE AAROM during session after ambulation to the therapy gym.  Therapist performed gently AAROM shoulder extension and internal rotation movements to simulate reaching behind the back.  Educated pt and daughter on working on reaching behind her back to simulated clothing management when in the room. Utilized finger ladder for 1 set of 10 repetitions for shoulder flexion and for shoulder abduction.  Pt able to achieve shoulder flexion to 110 degrees and abduction to 80 degrees with end range pain.  Pt reports most pain in the left arm at the fracture site when therapist provides AAROM for shoulder extension and internal rotation.    Ambulated back to room at end of session with supervision using the RW.   Therapy Documentation Precautions:  Precautions Precautions: Fall Precaution Comments:   Required Braces or Orthoses: Sling Restrictions Weight Bearing Restrictions: Yes LUE Weight Bearing: Weight  bearing as tolerated Other Position/Activity Restrictions: D/c sling and WBAT and ROM 10/24  Pain: Pain Assessment Pain Assessment: Faces Faces Pain Scale: Hurts little more Pain Type: Acute pain Pain Location: Shoulder Pain Orientation: Left Pain Descriptors / Indicators: Discomfort Pain Onset: With Activity Pain Intervention(s): Repositioned;Emotional support ADL: See Function Navigator for Current Functional Status.   Therapy/Group: Individual Therapy  Gideon Burstein OTR/L 07/08/2016, 1:03 PM

## 2016-07-08 NOTE — Progress Notes (Signed)
Physical Therapy Note  Patient Details  Name: Melanie Hendricks MRN: [id number] Date of Birth: 08-23-1936 Today's Date: 07/08/2016  0800-0915, 75 min; 1520-1610, 50 min individual tx Pain: none noted; premedicated  tx 1:  Pt donned socks and shoes with assistance due to time constraints.  Gait in room to sink to brush teeth.  Gait on level tile on unit with RW, supervision.  Simulated car transfer to 22" high seat with supervision.  Retrieved pen from floor while standing with RW, with difficulty, min assist. Gait short distances without AD, min assist with difficulty clearing feet due to fear.  neuromuscular re-education via demo, multimodal cues for reciprocal scooting without use of UEs to activate pelvic muscles. Up/down 4 steps 2 rails step through method with min assist for R knee eccentric control.  Patient demonstrates increased fall risk as noted by score of 37/56 on Berg Balance Scale.  (<36= high risk for falls, close to 100%; 37-45 significant >80%; 46-51 moderate >50%; 52-55 lower >25%); improvement of 7 points in 6 days. Fall recovery via 911 discussed with pt and dtr [place]. With external perturbations, pt demonstrated limited L ankle strategy, absent hip and stepping strategies. With focused activation of hips, pt improved in hip strategy. Pt left resting in w/c all needs within reach.  tx 2:  Gait to gym with RW over level tile, supervision.  10 MWT= 3/0'/sec.  neuromuscular re-education via forced use for bil alternating reciprocal movement x 4 extremities on NuStep at level 5 x 8 minutes with L=R UE excursion.  Gait in busy congested community setting with RW down/up sloping sidewalk and transferring to park bench with supervision.  W/c propulsion using bil UEs with cues for veering R, down 3% grade in hallway to [address]. [place].  Pt left resting in recliner with all needs within reach, dtr Spring present.    [name] 07/08/2016, 8:22 AM

## 2016-07-08 NOTE — Progress Notes (Signed)
Occupational Therapy Session Note  Patient Details  Name: Melanie Hendricks MRN: [id number] Date of Birth: [birth year]  Today's Date: 07/08/2016 OT Individual Time: [id number] OT Individual Time Calculation (min): 58 min     Short Term Goals: Week 2:  OT Short Term Goal 1 (Week 2): Pt will demonstrate AROM left shoulder flexion to 80 degrees for greater independence with selfcare tasks.  OT Short Term Goal 2 (Week 2): Pt will manage clothing with supervision sit to stand 3 consecutive sessions.  OT Short Term Goal 3 (Week 2): Pt will complete all bathing with supervision sit to stand.  OT Short Term Goal 4 (Week 2): Pt will perform LB dressing supervision with AE PRN.    Skilled Therapeutic Interventions/Progress Updates:    Pt completed bathing and dressing during session.  Supervision for ambulation to and from the shower during session.  Supervision for all bathing sit to stand on the shower seat.  Min instructional cueing to incorporate the LUE more during session.  She was able to ambulate to the bedside chair with supervision for dressing.  Increased time and use of a step stool for donning socks with therapist assisting with TEDs prior to this.  Daughter present for session as well and states daughter-in-law will be in tomorrow for family education.  Pt left in bedside chair with call button in reach.  Therapy Documentation Precautions:  Precautions Precautions: Fall Precaution Comments:   Required Braces or Orthoses: Sling Restrictions Weight Bearing Restrictions: No LUE Weight Bearing: Weight bearing as tolerated Other Position/Activity Restrictions: D/c sling and WBAT and ROM 10/24  Pain: Pain Assessment Pain Assessment: No/denies pain Faces Pain Scale: Hurts little more Pain Type: Acute pain Pain Location: Shoulder Pain Orientation: Left Pain Descriptors / Indicators: Discomfort Pain Onset: With Activity Pain Intervention(s): Repositioned;Emotional support ADL: See  Function Navigator for Current Functional Status.   Therapy/Group: Individual Therapy  Marybell Robards OTR/L 07/08/2016, 1:16 PM

## 2016-07-08 NOTE — Progress Notes (Signed)
Social Work Patient ID: Melanie Hendricks, female   DOB: 08/19/1936, 80 y.o.   MRN: [id number]  Met with pt and daughter and also spoke with son and daughter in-law per speaker phone to discuss team Conference progression toward her goals and meeting her goals sooner than thought. Have moved up discharge to 11/3. Daughter in-law will be here tomorrow to attend all day therapy to learn her care. Discussed equipment needs and follow up needs. Pt prefers OP therapies. Will discuss equipment needs tomorrow.

## 2016-07-08 NOTE — Patient Care Conference (Signed)
Inpatient RehabilitationTeam Conference and Plan of Care Update Date: 07/08/2016   Time: 10:40 am    Patient Name: JANEISY GUIDROZ      Medical Record Number: [id number]  Date of Birth: 08-Nov-1935 Sex: Female         Room/Bed: 4M10C/4M10C-01 Payor Info: Payor: [employer] MEDICARE / Plan: [employer] MEDICARE / Product Type: *No Product type* /    Admitting Diagnosis: Stroke  Admit Date/Time:  06/26/2016  6:57 PM Admission Comments: No comment available   Primary Diagnosis:  Left pontine cerebrovascular accident [facility]) Principal Problem: Left pontine cerebrovascular accident [facility])  Patient Active Problem List   Diagnosis Date Noted  . Post-operative pain   . Benign essential HTN   . Urinary retention   . Closed fracture of left proximal humerus   . Left pontine cerebrovascular accident ([place]) 06/26/2016  . Stroke (cerebrum) ([place]) 06/23/2016  . Blood pressure elevated without history of HTN 06/23/2016  . Left pontine stroke ([place]) 06/23/2016  . Stroke-like symptoms   . Eye movement disorder   . Closed fracture of neck of humerus with routine healing     Expected Discharge Date: Expected Discharge Date: 07/10/16  Team Members Present: Physician leading conference: Dr. Alysia Penna Social Worker Present: Ovidio Kin, LCSW Nurse Present: Heather Roberts, RN PT Present: [name], Rory Percy, PT OT Present: Clyda Greener, OT SLP Present: Windell Moulding, SLP PPS Coordinator present : Daiva Nakayama, RN, CRRN     Current Status/Progress Goal Weekly Team Focus  Medical   left shoulder pain with ROM, excellent progress  maintain medical stability , train family for post d/c care  D/C planning   Bowel/Bladder   pt continent of bowel and bladder. Increased urgency at times   minimal assist  Monitor.    Swallow/Nutrition/ Hydration             ADL's   steadying A to supervision; ambulation without AD with min A, close supervision with RW, AROM 0-70  upgraded goals to mod I    self care retraining, AROM for left UE, d/c planning safety home recommendations   Mobility   supervision overall  supervision transfers, supervision gait x 150', 4 steps 1 rails; w/c in controlled env supervision x 150'  pt and family ed, neuro re-ed, balance, locomotion   Communication             Safety/Cognition/ Behavioral Observations            Pain   Pain to left shoulder and hand. Tyelenol 650 prn q 6 hrs   3 or less  assess and treat accordingly   Skin   scattered bruising. MASD under breast. Interdry in place   free of skin breakdown min assist  Assess skin q shift. Monitor for breakdown       *See Care Plan and progress notes for long and short-term goals.  Barriers to Discharge: daughter needs family ed    Possible Resolutions to Barriers:  Move up d/c date    Discharge Planning/Teaching Needs:  pt doing well in therapies and getting more use of her arm back. Daughter here until [place] and son and daughter in-law to take some time off to be with pt at home.      Team Discussion:  Meeting upgraded goals of mod/i sooner than discharge date. Getting more use of her left shoulder. Daughter in-law to be here tomorrow for family education all day. Prepare for discharge Friday. Medically stable.  Revisions to Treatment Plan:  Upgraded goals of mod/i level   Continued Need for Acute Rehabilitation Level of Care: The patient requires daily medical management by a physician with specialized training in physical medicine and rehabilitation for the following conditions: Daily direction of a multidisciplinary physical rehabilitation program to ensure safe treatment while eliciting the highest outcome that is of practical value to the patient.: Yes Daily medical management of patient stability for increased activity during participation in an intensive rehabilitation regime.: Yes Daily analysis of laboratory values and/or radiology reports with any subsequent need for medication  adjustment of medical intervention for : Neurological problems;Other  Edith Lord, Gardiner Rhyme 07/08/2016, 3:08 PM

## 2016-07-09 ENCOUNTER — Inpatient Hospital Stay (HOSPITAL_COMMUNITY): Payer: Medicare Other | Admitting: Occupational Therapy

## 2016-07-09 ENCOUNTER — Inpatient Hospital Stay (HOSPITAL_COMMUNITY): Payer: Medicare Other | Admitting: Physical Therapy

## 2016-07-09 NOTE — Progress Notes (Signed)
Occupational Therapy Discharge Summary  Patient Details  Name: Melanie STRENG MRN: [id number] Date of Birth: 1936/04/09  Today's Date: 07/09/2016 OT Individual Time: 1962-2297 OT Individual Time Calculation (min): 74 min   Session Note:  Pt completed ADL session with daughter-in-law present as well.  Pt overall modified independent for gathering all clothing and transferring into the walk-in shower.  She was able to complete all bathing with modified independence as well.  Increased time needed for socks and shoes but she was able to perform this as well at a modified independent level.  Therapist assisted with TEDS but instructed pt and family to inquire if this was something she was going to have to wear at home.  Pt left in bedside recliner with family present.    Patient has met 10 of 11 long term goals due to improved activity tolerance, improved balance, postural control and functional use of  LEFT upper extremity.  Patient to discharge at overall Supervision level.  Patient's care partner is independent to provide the necessary physical assistance at discharge.    Reasons goals not met: Laundry goal not addressed during session.    Recommendation:  Patient will benefit from ongoing skilled OT services in outpatient setting to continue to advance functional skills in the area of BADL, iADL and Reduce care partner burden.  Pt still demonstrates decreased LUE AROM secondary to humeral fracture and has only been allowed full weightbearing and AROM as tolerated over the past 1.5 weeks.  Feel she needs extensive outpatient OT to further progress AROM and functional use of the extremity.   Equipment: tub bench, 3:1  Reasons for discharge: treatment goals met and discharge from hospital  Patient/family agrees with progress made and goals achieved: Yes  OT Discharge Precautions/Restrictions Precautions Precautions: Fall  Pain Pain Assessment Pain Assessment: Faces Faces Pain Scale:  Hurts little more Pain Type: Acute pain Pain Location: Shoulder Pain Orientation: Anterior Pain Descriptors / Indicators: Discomfort Pain Intervention(s): Repositioned ADL  See Function Section of chart for details  Vision/Perception  Vision- History Baseline Vision/History: Wears glasses Wears Glasses: At all times Patient Visual Report: No change from baseline Vision- Assessment Vision Assessment?: No apparent visual deficits  Cognition Overall Cognitive Status: Within Functional Limits for tasks assessed Arousal/Alertness: Awake/alert Orientation Level: Oriented X4 Attention: Sustained Sustained Attention: Appears intact Memory: Appears intact Awareness: Appears intact Problem Solving: Appears intact Safety/Judgment: Appears intact Sensation Sensation Light Touch: Appears Intact Stereognosis: Appears Intact Hot/Cold: Appears Intact Proprioception: Appears Intact Coordination Gross Motor Movements are Fluid and Coordinated: No Fine Motor Movements are Fluid and Coordinated: Yes Coordination and Movement Description: Pt with decreased LUE functional movement secondary to recent humeral fracture but currently using at a diminished level for selfcare tasks.   Motor  Motor Motor: Hemiplegia Motor - Discharge Observations: mild weakness noted in the RLE  Mobility  Bed Mobility Rolling Right: 6: Modified independent (Device/Increase time) Rolling Left: 6: Modified independent (Device/Increase time) Supine to Sit: 6: Modified independent (Device/Increase time) Transfers Transfers: Sit to Stand Sit to Stand: 6: Modified independent (Device/Increase time)  Trunk/Postural Assessment  Cervical Assessment Cervical Assessment: Within Functional Limits Thoracic Assessment Thoracic Assessment: Exceptions to [facility] (thoracic rounding) Lumbar Assessment Lumbar Assessment: Exceptions to [facility] (increased lumbar flexion noted)  Balance Balance Balance Assessed: Yes Dynamic Sitting  Balance Dynamic Sitting - Balance Support: No upper extremity supported Dynamic Sitting - Level of Assistance: 6: Modified independent (Device/Increase time) Static Standing Balance Static Standing - Balance Support: No upper extremity supported Static  Standing - Level of Assistance: 6: Modified independent (Device/Increase time) Dynamic Standing Balance Dynamic Standing - Balance Support: Right upper extremity supported;Left upper extremity supported;During functional activity Dynamic Standing - Level of Assistance: 6: Modified independent (Device/Increase time) Extremity/Trunk Assessment RUE Assessment RUE Assessment: Within Functional Limits LUE Assessment LUE Assessment: Exceptions to [facility] LUE Strength LUE Overall Strength Comments: Demonstrates AROM shoulder flexion 0-74 degrees with AAROM 0-98 degrees, AROM abduction 0-70 degrees with AAROM tolerated to 96 degrees, external rotation 0-32 degrees AROM, shoulder extension 62 degrees.  Pt with scapular elevation and upward rotation at rest.  Elbow flexion AROM WFLs but pt reports some pain at fracture site on anterior part of the upper arm.     See Function Navigator for Current Functional Status.  Yuvraj Pfeifer OTR/L 07/09/2016, 4:21 PM

## 2016-07-09 NOTE — Progress Notes (Signed)
[facility] PHYSICAL MEDICINE & REHABILITATION     PROGRESS NOTE  Subjective/Complaints:  No issues overnite  ROS: Pt denies fever, rash/itching, headache, blurred or double vision, nausea, vomiting, abdominal pain, diarrhea, chest pain, shortness of breath, palpitations, dysuria, dizziness, neck or back pain, bleeding, anxiety, or depression   Objective: Vital Signs: Blood pressure (!) 118/98, pulse 88, temperature 98.1 F (36.7 C), temperature source Oral, resp. rate 18, height 5\' 6"  (1.676 m), weight 91.6 kg (202 lb), SpO2 98 %. No results found.  Recent Labs  07/06/16 0753  WBC 5.8  HGB 13.6  HCT 42.1  PLT 316   No results for input(s): NA, K, CL, GLUCOSE, BUN, CREATININE, CALCIUM in the last 72 hours.  Invalid input(s): CO CBG (last 3)  No results for input(s): GLUCAP in the last 72 hours.  Wt Readings from Last 3 Encounters:  07/08/16 91.6 kg (202 lb)  06/24/16 109 kg (240 lb 6.4 oz)  05/14/16 90.7 kg (200 lb)    Physical Exam:  BP (!) 118/98 (BP Location: Right Arm)   Pulse 88   Temp 98.1 F (36.7 C) (Oral)   Resp 18   Ht 5\' 6"  (1.676 m)   Wt 91.6 kg (202 lb)   SpO2 98%   BMI 32.60 kg/m  Constitutional: She appears well-developed. NAD. Vital signs reviewed. Eyes: EOMare normal. No discharge.  Cardiovascular: Normal rateand regular rhythm.  Respiratory: Effort normaland breath sounds normal. No respiratory distress.  GI: Soft. Bowel sounds are normal. She exhibits no distension.  MusculoskeletalNo pain with range of motion. No limitation in range of motion. Left shoulder limited range of motion  Neurological: She is alertand oriented.   good insight and awareness  Motor: 4/5 right biceps, triceps, wrist and HI  4/5 right HF, 3+KE and 4+ ADF/PF  LUE limited by pain, ~2+/5.  LLE 5/5 prox to distal.  Musculoskeletal: Left shoulder abd to 90deg with end range pain   Skin: Skin is warm and dry.  Psychiatric: She has a normal mood and affect. Her  behavior is normal. Judgmentand thought contentnormal  Assessment/Plan: 1. Functional deficits secondary to left pontine infarct which require 3+ hours per day of interdisciplinary therapy in a comprehensive inpatient rehab setting. Physiatrist is providing close team supervision and 24 hour management of active medical problems listed below. Physiatrist and rehab team continue to assess barriers to discharge/monitor patient progress toward functional and medical goals.  Function:  Bathing Bathing position   Position: Shower  Bathing parts Body parts bathed by patient: Left arm, Chest, Abdomen, Front perineal area, Buttocks, Right upper leg, Left upper leg, Right lower leg, Left lower leg, Right arm Body parts bathed by helper: Back  Bathing assist Assist Level: Supervision or verbal cues      Upper Body Dressing/Undressing Upper body dressing   What is the patient wearing?: Button up shirt, Pull over shirt/dress     Pull over shirt/dress - Perfomed by patient: Thread/unthread right sleeve, Thread/unthread left sleeve, Put head through opening, Pull shirt over trunk   Button up shirt - Perfomed by patient: Thread/unthread left sleeve, Pull shirt around back, Button/unbutton shirt, Thread/unthread right sleeve Button up shirt - Perfomed by helper: Thread/unthread right sleeve    Upper body assist Assist Level: Supervision or verbal cues   Set up : To obtain clothing/put away  Lower Body Dressing/Undressing Lower body dressing   What is the patient wearing?: Pants, Shoes, Underwear, Socks, [employer]  - Performed by patient: Thread/unthread right underwear  leg, Thread/unthread left underwear leg, Pull underwear up/down Underwear - Performed by helper: Pull underwear up/down Pants- Performed by patient: Thread/unthread right pants leg, Thread/unthread left pants leg, Pull pants up/down Pants- Performed by helper: Pull pants up/down   Non-skid slipper socks- Performed by  helper: Don/doff right sock, Don/doff left sock Socks - Performed by patient: Don/doff right sock, Don/doff left sock   Shoes - Performed by patient: Don/doff left shoe, Don/doff right shoe, Fasten right, Fasten left Shoes - Performed by helper: Don/doff right shoe, Don/doff left shoe, Fasten right, Fasten left     TED Hose - Performed by patient: Don/doff right TED hose, Don/doff left TED hose TED Hose - Performed by helper: Don/doff right TED hose, Don/doff left TED hose  Lower body assist Assist for lower body dressing: Touching or steadying assistance (Pt > 75%)      Toileting Toileting   Toileting steps completed by patient: Adjust clothing prior to toileting, Performs perineal hygiene, Adjust clothing after toileting Toileting steps completed by helper: Adjust clothing after toileting Toileting Assistive Devices: Grab bar or rail  Toileting assist Assist level: Touching or steadying assistance (Pt.75%)   Transfers Chair/bed transfer   Chair/bed transfer method: Ambulatory Chair/bed transfer assist level: Supervision or verbal cues Chair/bed transfer assistive device: Medical sales representative     Max distance: 150 Assist level: Supervision or verbal cues   Wheelchair   Type: Manual Max wheelchair distance: 160 Assist Level: Supervision or verbal cues  Cognition Comprehension Comprehension assist level: Understands complex 90% of the time/cues 10% of the time  Expression Expression assist level: Expresses complex 90% of the time/cues < 10% of the time  Social Interaction Social Interaction assist level: Interacts appropriately with others with medication or extra time (anti-anxiety, antidepressant).  Problem Solving Problem solving assist level: Solves basic problems with no assist  Memory Memory assist level: Recognizes or recalls 75 - 89% of the time/requires cueing 10 - 24% of the time     Medical Problem List and Plan: 1.  Right hemiplegia secondary to  left pontine infarct             -Cont CIR PT, OT, Very steady with walker in room,Plan d/c in am2.  DVT Prophylaxis/Anticoagulation: Subcutaneous Lovenox.may d/c as pt is ambulating longer distances  3. Pain Management: Hydrocodone as needed, this is taken prior to PT/OT for L Shoulder pain 5. Neuropsych: This patient is capable of making decisions on her own behalf. 6. Skin/Wound Care: Routine skin checks, pressure relief as appropriate 7. Fluids/Electrolytes/Nutrition: Routine I&O  Cr. WNL on 10/20 8. Hyperlipidemia. Lipitor 9. Left humerus fracture 7 weeks ago after a fall.Left shoulder contracture Weightbearing as tolerated.             -LUE elevation/compression to help control edema,More aggressive range of motion   -improved ROM/WB has helped with functional activities 10. HTN  Lisinopril 2.5 started on 10/21, increased on 10/23 to 5mg , currently in a good range Vitals:   07/08/16 1500 07/09/16 0540  BP: (!) 150/65 (!) 118/98  Pulse: (!) 102 88  Resp: 16 18  Temp: 98.6 F (37 C) 98.1 F (36.7 C)       LOS (Days) 13 A FACE TO FACE EVALUATION WAS PERFORMED  Melanie Hendricks E 07/09/2016 7:20 AM

## 2016-07-09 NOTE — Progress Notes (Signed)
Physical Therapy Discharge Summary  Patient Details  Name: Melanie Hendricks MRN: [id number] Date of Birth: 1936/04/23  Today's Date: 07/09/2016 PT Individual Time: 1001-1100 PT Individual Time Calcualtion:59 min    Patient has met 13 of 13 long term goals due to improved activity tolerance, improved balance, improved postural control, increased strength, increased range of motion, decreased pain, ability to compensate for deficits, functional use of  right upper extremity and right lower extremity and improved coordination.  Patient to discharge at an ambulatory level Supervision.   Patient's care partner is independent to provide the necessary physical assistance at discharge.  Reasons goals not met: All goals for PT met.   Recommendation:  Patient will benefit from ongoing skilled PT services in outpatient setting to continue to advance safe functional mobility, address ongoing impairments in strength, balance, coordination, endurance, and minimize fall risk.  Equipment: WC, RW.   Reasons for discharge: treatment goals met and discharge from hospital  Patient/family agrees with progress made and goals achieved: Yes   Patient treatment: Patient received sitting in [facility] and agreeable to PT. PT instructed patient in grad day assessment; see below for results. PT instructed patient in Gait training as listed below for 266f, 1552f 1705fnd [id number] training  With RW also instructed by PT with supervision Assist from PT over ramp and unlevel surface x 15ft34fough mulch. PT instructed patient in car transfer with stand pivot transfer as listed below and picking up object off floor with supervision Assist and RW. Patient and family educated patient in benefits of outpatient PT vs home health PT. Patient left sitting in recliner with family present with call bell in reach.     PT Discharge Precautions/Restrictions  Vital Signs Therapy Vitals BP: (!) 121/99 Pain Pain Assessment Pain  Assessment: 0-10 Pain Score: 2  Pain Location: Shoulder Vision/Perception  Vision - Assessment Additional Comments: reports mild light sensitivity.  Perception Comments: WNL  Cognition   WFL.  Sensation Sensation Light Touch: Appears Intact Proprioception: Appears Intact Coordination Gross Motor Movements are Fluid and Coordinated: Yes Fine Motor Movements are Fluid and Coordinated: Yes Coordination and Movement Description: patient noted to have no ataxia with gait and normal step length bilaterally with gait using RW Heel Shin Test: WNL BLE.  Motor  Motor Motor: Ataxia Motor - Discharge Observations: mild ataxia with gait and with stairs in the RLE  Mobility Bed Mobility Bed Mobility: Rolling Right;Rolling Left;Supine to Sit;Sit to Sidelying Right;Sit to Supine Rolling Right: 6: Modified independent (Device/Increase time) Rolling Left: 6: Modified independent (Device/Increase time) Supine to Sit: 6: Modified independent (Device/Increase time) Sit to Supine: 6: Modified independent (Device/Increase time) Transfers Transfers: Yes Sit to Stand: 6: Modified independent (Device/Increase time) ( with RW) Stand Pivot Transfers: 6: Modified independent (Device/Increase time) (with RW) Locomotion  Ambulation Ambulation: Yes Ambulation/Gait Assistance: 5: Supervision Ambulation Distance (Feet): 250 Feet Assistive device: Rolling walker Gait Gait: Yes Gait Pattern: Ataxic Stairs / Additional Locomotion Stairs: Yes Stairs Assistance: 5: Supervision Stair Management Technique: Two rails Number of Stairs: 12 Height of Stairs: 6 Curb: 5: Supervision (RW) Wheelchair Mobility Wheelchair Mobility: No  Trunk/Postural Assessment  Cervical Assessment Cervical Assessment: Within Functional Limits Thoracic Assessment Thoracic Assessment: Exceptions to WFL ([facility]) Lumbar Assessment Lumbar Assessment: Exceptions to WFL (Flat back) Postural Control Postural Control:  Within Functional Limits  Balance Balance Balance Assessed: Yes Dynamic Sitting Balance Dynamic Sitting - Balance Support: No upper extremity supported Dynamic Sitting - Level of Assistance: 6: Modified independent (Device/Increase  time) Dynamic Sitting - Balance Activities: Reaching for objects Dynamic Standing Balance Dynamic Standing - Balance Support: Right upper extremity supported Dynamic Standing - Level of Assistance: 5: Stand by assistance Extremity Assessment      RLE Assessment RLE Assessment: Within Functional Limits (4/5 hip flexion, 4+/5 all others test. ) LLE Assessment LLE Assessment: Within Functional Limits (5/5 throughout proximal to distal except hip flexion 4+/5)   See Function Navigator for Current Functional Status.  Lorie Phenix 07/09/2016, 10:56 AM

## 2016-07-09 NOTE — Discharge Summary (Signed)
Discharge summary job [id number]

## 2016-07-09 NOTE — Discharge Instructions (Signed)
Inpatient Rehab Discharge Instructions  Melanie Hendricks Discharge date and time: No discharge date for patient encounter.   Activities/Precautions/ Functional Status: Activity: activity as tolerated Diet: regular diet Wound Care: none needed Functional status:  ___ No restrictions     ___ Walk up steps independently ___ 24/7 supervision/assistance   ___ Walk up steps with assistance ___ Intermittent supervision/assistance  ___ Bathe/dress independently ___ Walk with walker     _x STROKE/TIA DISCHARGE INSTRUCTIONS SMOKING Cigarette smoking nearly doubles your risk of having a stroke & is the single most alterable risk factor  If you smoke or have smoked in the last 12 months, you are advised to quit smoking for your health.  Most of the excess cardiovascular risk related to smoking disappears within a year of stopping.  Ask you doctor about anti-smoking medications  [place] Quit Line: 1-800-QUIT NOW  Free Smoking Cessation Classes [phone]  CHOLESTEROL Know your levels; limit fat & cholesterol in your diet  Lipid Panel     Component Value Date/Time   CHOL 242 (H) 06/24/2016 0406   TRIG 64 06/24/2016 0406   HDL 82 06/24/2016 0406   CHOLHDL 3.0 06/24/2016 0406   VLDL 13 06/24/2016 0406   LDLCALC 147 (H) 06/24/2016 0406      Many patients benefit from treatment even if their cholesterol is at goal.  Goal: Total Cholesterol (CHOL) less than 160  Goal:  Triglycerides (TRIG) less than 150  Goal:  HDL greater than 40  Goal:  LDL (LDLCALC) less than 100   BLOOD PRESSURE American Stroke Association blood pressure target is less that 120/80 mm/Hg  Your discharge blood pressure is:  BP: (!) 118/98  Monitor your blood pressure  Limit your salt and alcohol intake  Many individuals will require more than one medication for high blood pressure  DIABETES (A1c is a blood sugar average for last 3 months) Goal HGBA1c is under 7% (HBGA1c is blood sugar average for last 3 months)    Diabetes: No known diagnosis of diabetes    Lab Results  Component Value Date   HGBA1C 5.4 06/24/2016     Your HGBA1c can be lowered with medications, healthy diet, and exercise.  Check your blood sugar as directed by your physician  Call your physician if you experience unexplained or low blood sugars.  PHYSICAL ACTIVITY/REHABILITATION Goal is 30 minutes at least 4 days per week  Activity: Increase activity slowly, Therapies: Physical Therapy: Home Health Return to work:   Activity decreases your risk of heart attack and stroke and makes your heart stronger.  It helps control your weight and blood pressure; helps you relax and can improve your mood.  Participate in a regular exercise program.  Talk with your doctor about the best form of exercise for you (dancing, walking, swimming, cycling).  DIET/WEIGHT Goal is to maintain a healthy weight  Your discharge diet is: Diet regular Room service appropriate? Yes; Fluid consistency: Thin  liquids Your height is:  Height: 5\' 6"  (167.6 cm) Your current weight is: Weight: 91.6 kg (202 lb) Your Body Mass Index (BMI) is:  BMI (Calculated): 38.6  Following the type of diet specifically designed for you will help prevent another stroke.  Your goal weight range is:    Your goal Body Mass Index (BMI) is 19-24.  Healthy food habits can help reduce 3 risk factors for stroke:  High cholesterol, hypertension, and excess weight.  RESOURCES Stroke/Support Group:  Call [facility]  TO PATIENT Stroke warning signs and symptoms How to activate emergency medical system (call 911). Medications prescribed at discharge. Need for follow-up after discharge. Personal risk factors for stroke. Pneumonia vaccine given:  Flu vaccine given:  My questions have been answered, the writing is legible, and I understand these instructions.  I will adhere to these goals & educational materials that have been  provided to me after my discharge from the hospital.   __ Bathe/dress with assistance ___ Walk Independently    ___ Shower independently ___ Walk with assistance    ___ Shower with assistance ___ No alcohol     ___ Return to work/school ________  Special Instructions:    COMMUNITY REFERRALS UPON DISCHARGE:    Outpatient: PT & OT  Agency:[facility] Phone:[phone]   Date of Last Service:07/10/2016  Appointment Date/Time:NOVEMBER 6 Monday 8:15-10:15 AM  Medical Equipment/Items [place], [place]    [phone]   GENERAL COMMUNITY RESOURCES FOR PATIENT/FAMILY: Support Groups:[facility] SUPPORT GROUP EVERY SECOND Thursday @ 3:00-4:00 PM ON THE REHAB UNIT QUESTIONS CONTACT KATIE [id number]  My questions have been answered and I understand these instructions. I will adhere to these goals and the provided educational materials after my discharge from the hospital.  Patient/Caregiver Signature _______________________________ Date __________  Clinician Signature _______________________________________ Date __________  Please bring this form and your medication list with you to all your follow-up doctor's appointments.

## 2016-07-09 NOTE — Progress Notes (Signed)
Occupational Therapy Session Note  Patient Details  Name: Melanie Hendricks MRN: [id number] Date of Birth: 1936-06-10  Today's Date: 07/09/2016 OT Individual Time: 0800-0901 OT Individual Time Calculation (min): 61 min     Short Term Goals: Week 2:  OT Short Term Goal 1 (Week 2): Pt will demonstrate AROM left shoulder flexion to 80 degrees for greater independence with selfcare tasks.  OT Short Term Goal 2 (Week 2): Pt will manage clothing with supervision sit to stand 3 consecutive sessions.  OT Short Term Goal 3 (Week 2): Pt will complete all bathing with supervision sit to stand.  OT Short Term Goal 4 (Week 2): Pt will perform LB dressing supervision with AE PRN.    Skilled Therapeutic Interventions/Progress Updates:    Pt donned TEDS, socks, and shoes to start session.  Supervision for socks and shoes with therapist providing max assist for donning TEDs.  Pt completed walker safety in the kitchen to transport items around using the walker bag and counter surfaces.  Supervision for all aspects including reaching down to the lower cabinets and reaching the bottom shelf of the upper cabinets using the LUE and min assist.  Pt's family present for education as well and walker bag issued.  Finished session with education and performance of AAROM exercises and stretching.  Pt completed 1 set of 10 repetitions using dowel rod for assistive shoulder flexion, assistive shoulder external rotation, and internal rotation by reaching behind the back.  Completed table slides for shoulder flexion and abduction.  Handout also provided for exercises.  Pt left in room with daughter and daughter-in-law present.     Therapy Documentation Precautions:  Precautions Precautions: Fall Precaution Comments:   Required Braces or Orthoses: Sling Restrictions Weight Bearing Restrictions: Yes LUE Weight Bearing: Non weight bearing Other Position/Activity Restrictions: D/c sling and WBAT and ROM 10/24  Pain: Pain  Assessment Pain Assessment: 0-10 Pain Score: 2  Pain Location: Shoulder ADL: See Function Navigator for Current Functional Status.   Therapy/Group: Individual Therapy  Kashonda Sarkisyan OTR/L 07/09/2016, 12:17 PM

## 2016-07-10 LAB — CREATININE, SERUM: Creatinine, Ser: 0.71 mg/dL (ref 0.44–1.00)

## 2016-07-10 MED ORDER — LISINOPRIL 5 MG PO TABS: 5.0000 mg | ORAL_TABLET | Freq: Every day | ORAL | 1 refills | Status: DC

## 2016-07-10 MED ORDER — TRAMADOL HCL 50 MG PO TABS: 50.0000 mg | ORAL_TABLET | Freq: Four times a day (QID) | ORAL | 0 refills | Status: DC | PRN

## 2016-07-10 MED ORDER — ASPIRIN 81 MG PO TBEC: 81.0000 mg | DELAYED_RELEASE_TABLET | Freq: Every day | ORAL | Status: DC

## 2016-07-10 MED ORDER — CLOPIDOGREL BISULFATE 75 MG PO TABS: 75.0000 mg | ORAL_TABLET | Freq: Every day | ORAL | 1 refills | Status: DC

## 2016-07-10 MED ORDER — DOCUSATE SODIUM 100 MG PO CAPS: 100.0000 mg | ORAL_CAPSULE | Freq: Two times a day (BID) | ORAL | 0 refills | Status: DC

## 2016-07-10 MED ORDER — ATORVASTATIN CALCIUM 40 MG PO TABS: 40.0000 mg | ORAL_TABLET | Freq: Every day | ORAL | 1 refills | Status: DC

## 2016-07-10 NOTE — Discharge Summary (Signed)
NAMESHAE, SHEVCHENKO NO.:  [id number]  MEDICAL RECORD NO.:  [id number]  LOCATION:  4M10C                        FACILITY:  [facility]  PHYSICIAN:  Charlett Blake, M.D.DATE OF BIRTH:  1935-11-22  DATE OF ADMISSION:  06/25/2016 DATE OF DISCHARGE:  07/10/2016                              DISCHARGE SUMMARY   DISCHARGE DIAGNOSES: 1. Left pontine infarct. 2. Subcutaneous Lovenox for DVT prophylaxis. 3. Pain management. 4. Hyperlipidemia. 5. Left humerus fracture. 6. Hypertension.  HISTORY OF PRESENT ILLNESS:  This is a 80 year old right-handed female with history of recent left humerus fracture 5 weeks ago after a fall, maintained with nonweightbearing.  Presented on June 23, 2016, with slurred speech, right-sided weakness, facial droop.  She lives with her children, independent prior to admission.  One level home.  CT, MRI showed acute ischemic nonhemorrhagic left paramedian pontine infarct. 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  Carotid Dopplers with no obvious ICA stenosis. Echocardiogram with ejection fraction of [id number], grade 1 diastolic dysfunction.  EEG, no seizure activity.  Neurology consulted, placed on aspirin and Plavix for CVA prophylaxis.  Subcutaneous Lovenox for DVT prophylaxis.  Tolerating a regular diet.  Physical and occupational therapy ongoing.  The patient was admitted for comprehensive rehab program.  PAST MEDICAL HISTORY:  See discharge diagnoses.  SOCIAL HISTORY:  Independent prior to admission, living with children. Functional status upon admission to rehab services was max assist, 10 feet, 2 person handheld assistance; minimal assist, sit to stand; mod to max assist, activities of daily living.  PHYSICAL EXAMINATION:  VITAL SIGNS:  Blood pressure 162/62, pulse 105, temperature 98, respirations 20. GENERAL:  This was an  alert female, in no acute distress.  Followed commands.  Good awareness of deficits. LUNGS:  Clear to auscultation without wheeze. CARDIAC:  Regular rate and rhythm without murmur. ABDOMEN:  Soft, nontender.  Good bowel sounds.  REHABILITATION/HOSPITAL COURSE:  The patient was admitted to inpatient rehab services with therapies initiated on a 3-hour daily basis consisting of physical therapy, occupational therapy, and rehabilitation nursing.  The following issues were addressed during the patient's rehabilitation stay.  Pertaining to Ms. Tauer's left pontine infarct remained stable, maintained on aspirin and Plavix therapy.  She would follow up with Neurology Services.  Subcutaneous Lovenox for DVT prophylaxis.  No bleeding episodes.  Pain management with the use of hydrocodone as needed with noted history of left humerus fracture 7 weeks ago after a fall.  Left shoulder was somewhat contracted.  She was advanced to weightbearing as tolerated per Orthopedic Services. Improved range of motion.  Blood pressures controlled on lisinopril. She would follow up with her primary MD.  Hyperlipidemia with Lipitor ongoing.  The patient received weekly collaborative interdisciplinary team conferences to discuss estimated length of stay, family teaching, any barriers to her discharge.  She was ambulating on level tile with rolling walker at supervision.  Simulated car transfers at supervision. Retrieve a pen from the floor while standing with rolling walker at minimal assistance.  Up and down 4 steps, minimal assistance.  She demonstrated some fall risk as noted by  scoring 37/56 on Berg Balance Scale.  Discussed with family the need for supervision for her safety. Activities of daily living and homemaking needing some assistance due to recent left humerus fracture, gentle active range of motion, shoulder extension and internal rotation movements to simulate reaching behind her back.  Full family  teaching was completed and plan discharge to home.  DISCHARGE MEDICATIONS: 1. Aspirin 81 mg p.o. daily. 2. Lipitor 40 mg p.o. daily. 3. Plavix 75 mg p.o. daily. 4. Colace 100 mg p.o. b.i.d. 5. Lisinopril 5 mg p.o. daily. 6. Ultram 50 mg p.o. every 6 hours as needed pain.  DIET:  Her diet was regular.  FOLLOWUP:  She would follow up with Dr. Alysia Penna at the outpatient rehab center as directed; Dr. Antony Contras, Neurology Service, 1 month call for appointment; Dr. Caffie Damme, Orthopedic Services; Dr. Mauricio Po, Medical Management.  SPECIAL INSTRUCTIONS:  Weightbearing as tolerated, upper extremity.     Lauraine Rinne, P.A.   ______________________________ Charlett Blake, M.D.    DA/MEDQ  D:  07/09/2016  T:  07/10/2016  Job:  [id number]  cc:   Mauricio Po, Dr. Kathie Rhodes. Leonie Man, MD Caffie Damme, Dr.

## 2016-07-10 NOTE — Progress Notes (Signed)
Social Work  Discharge Note  The overall goal for the admission was met for:   Discharge location: Yes-HOME WITH SON AND DAUGHTER [place] WHO PLAN TO BE WITH A FEW DAYS TO MAKE TRANSITION SMOOTH  Length of Stay: Yes-13 DAYS  Discharge activity level: Yes-MOD/I LEVEL  Home/community participation: Yes  Services provided included: MD, RD, PT, OT, SLP, RN, CM, TR, Pharmacy and SW  Financial Services: Private Insurance: [facility]  Follow-up services arranged: Outpatient: [facility]PT & OT 11/6 8;15-10;15 AM, DME: [facility], [place] and Patient/Family has no preference for HH/DME agencies  Comments (or additional information):PT DID WELL AND PROGRESSED TO MOD/I LEVEL ONCE ABLE TO FWB ON HER SHOULDER. SON AND DAUGHTER [place] TO PROVIDE A FEW DAYS OF SUPERVISION LEVEL. DAUGHTER WILL BE HERE UNTIL Monday BEFORE GOING BACK TO [place].  Patient/Family verbalized understanding of follow-up arrangements: Yes  Individual responsible for coordination of the follow-up plan: SELF & HANNAH-DAUGHTER [place]  Confirmed correct DME delivered: Elease Hashimoto 07/10/2016    Elease Hashimoto

## 2016-07-10 NOTE — Progress Notes (Signed)
[facility] PHYSICAL MEDICINE & REHABILITATION     PROGRESS NOTE  Subjective/Complaints:  No issues overnite  ROS: Pt denies fever, rash/itching, headache, blurred or double vision, nausea, vomiting, abdominal pain, diarrhea, chest pain, shortness of breath, palpitations, dysuria, dizziness, neck or back pain, bleeding, anxiety, or depression   Objective: Vital Signs: Blood pressure (!) 136/58, pulse 93, temperature 99.8 F (37.7 C), temperature source Oral, resp. rate 18, height 5\' 6"  (1.676 m), weight 91.6 kg (202 lb), SpO2 100 %. No results found. No results for input(s): WBC, HGB, HCT, PLT in the last 72 hours.  Recent Labs  07/10/16 0518  CREATININE 0.71   CBG (last 3)  No results for input(s): GLUCAP in the last 72 hours.  Wt Readings from Last 3 Encounters:  07/08/16 91.6 kg (202 lb)  06/24/16 109 kg (240 lb 6.4 oz)  05/14/16 90.7 kg (200 lb)    Physical Exam:  BP (!) 136/58 (BP Location: Right Arm)   Pulse 93   Temp 99.8 F (37.7 C) (Oral)   Resp 18   Ht 5\' 6"  (1.676 m)   Wt 91.6 kg (202 lb)   SpO2 100%   BMI 32.60 kg/m  Constitutional: She appears well-developed. NAD. Vital signs reviewed. Eyes: EOMare normal. No discharge.  Cardiovascular: Normal rateand regular rhythm.  Respiratory: Effort normaland breath sounds normal. No respiratory distress.  GI: Soft. Bowel sounds are normal. She exhibits no distension.  MusculoskeletalNo pain with range of motion. No limitation in range of motion. Left shoulder limited range of motion  Neurological: She is alertand oriented.   good insight and awareness  Motor: 4/5 right biceps, triceps, wrist and HI  4/5 right HF, 3+KE and 4+ ADF/PF  LUE limited by pain, ~2+/5.  LLE 5/5 prox to distal.  Musculoskeletal: Left shoulder abd to 90deg with end range pain   Skin: Skin is warm and dry.  Psychiatric: She has a normal mood and affect. Her behavior is normal. Judgmentand thought  contentnormal  Assessment/Plan: 1. Functional deficits secondary to left pontine infarct Stable for D/C today F/u PCP in 3-4 weeks F/u PM&R 2 weeks F/u ortho 1-2 wk See D/C summary See D/C instructionsFunction:  Bathing Bathing position   Position: Shower  Bathing parts Body parts bathed by patient: Left arm, Chest, Abdomen, Front perineal area, Buttocks, Right upper leg, Left upper leg, Right lower leg, Left lower leg, Right arm, Back Body parts bathed by helper: Back  Bathing assist Assist Level: More than reasonable time      Upper Body Dressing/Undressing Upper body dressing   What is the patient wearing?: Button up shirt, Pull over shirt/dress     Pull over shirt/dress - Perfomed by patient: Thread/unthread right sleeve, Thread/unthread left sleeve, Put head through opening, Pull shirt over trunk   Button up shirt - Perfomed by patient: Thread/unthread left sleeve, Pull shirt around back, Button/unbutton shirt, Thread/unthread right sleeve Button up shirt - Perfomed by helper: Thread/unthread right sleeve    Upper body assist Assist Level: More than reasonable time   Set up : To obtain clothing/put away  Lower Body Dressing/Undressing Lower body dressing   What is the patient wearing?: Pants, Shoes, Underwear, Socks, [employer]  - Performed by patient: Thread/unthread right underwear leg, Thread/unthread left underwear leg, Pull underwear up/down Underwear - Performed by helper: Pull underwear up/down Pants- Performed by patient: Thread/unthread right pants leg, Thread/unthread left pants leg, Pull pants up/down Pants- Performed by helper: Pull pants up/down   Non-skid slipper  socks- Performed by helper: Don/doff right sock, Don/doff left sock Socks - Performed by patient: Don/doff right sock, Don/doff left sock   Shoes - Performed by patient: Don/doff left shoe, Don/doff right shoe, Fasten right, Fasten left Shoes - Performed by helper: Don/doff right shoe,  Don/doff left shoe, Fasten right, Fasten left     TED Hose - Performed by patient: Don/doff right TED hose, Don/doff left TED hose TED Hose - Performed by helper: Don/doff right TED hose, Don/doff left TED hose  Lower body assist Assist for lower body dressing: More than reasonable time      Toileting Toileting   Toileting steps completed by patient: Adjust clothing prior to toileting, Performs perineal hygiene, Adjust clothing after toileting Toileting steps completed by helper: Adjust clothing after toileting Toileting Assistive Devices: Grab bar or rail  Toileting assist Assist level: More than reasonable time   Transfers Chair/bed transfer   Chair/bed transfer method: Stand pivot Chair/bed transfer assist level: Supervision or verbal cues Chair/bed transfer assistive device: Armrests, Medical sales representative     Max distance: 257ft  Assist level: Supervision or Psychologist, clinical activity did not occur: N/A Type: Manual Max wheelchair distance: 160 Assist Level: Supervision or verbal cues  Cognition Comprehension Comprehension assist level: Understands complex 90% of the time/cues 10% of the time  Expression Expression assist level: Expresses complex 90% of the time/cues < 10% of the time  Social Interaction Social Interaction assist level: Interacts appropriately with others with medication or extra time (anti-anxiety, antidepressant).  Problem Solving Problem solving assist level: Solves complex 90% of the time/cues < 10% of the time  Memory Memory assist level: Recognizes or recalls 90% of the time/requires cueing < 10% of the time     Medical Problem List and Plan: 1.  Right hemiplegia secondary to left pontine infarct             -Cont CIR PT, OT, Very steady with walker in room,Plan d/c today2.  DVT Prophylaxis/Anticoagulation: Subcutaneous Lovenox.may d/c as pt is ambulating longer distances  3. Pain Management: Hydrocodone as needed,  this is taken prior to PT/OT for L Shoulder pain 5. Neuropsych: This patient is capable of making decisions on her own behalf. 6. Skin/Wound Care: Routine skin checks, pressure relief as appropriate 7. Fluids/Electrolytes/Nutrition: Routine I&O  Cr. WNL on 10/20 8. Hyperlipidemia. Lipitor 9. Left humerus fracture 7 weeks ago after a fall.Left shoulder contracture Weightbearing as tolerated.             -LUE elevation/compression to help control edema,More aggressive range of motion   -improved ROM/WB has helped with functional activities 10. HTN  Lisinopril 2.5 started on 10/21, increased on 10/23 to 5mg , currently in a good range Vitals:   07/09/16 1501 07/10/16 0541  BP: (!) 141/45 (!) 136/58  Pulse: (!) 108 93  Resp: 18 18  Temp: 98 F (36.7 C) 99.8 F (37.7 C)       LOS (Days) 14 A FACE TO FACE EVALUATION WAS PERFORMED  Taishawn Smaldone E 07/10/2016 7:09 AM

## 2016-07-13 ENCOUNTER — Ambulatory Visit: Payer: Medicare Other | Admitting: Occupational Therapy

## 2016-07-13 ENCOUNTER — Ambulatory Visit: Payer: Medicare Other | Attending: Physical Medicine & Rehabilitation | Admitting: *Deleted

## 2016-07-13 ENCOUNTER — Telehealth: Payer: Self-pay | Admitting: *Deleted

## 2016-07-13 VITALS — BP 148/86 | HR 108

## 2016-07-13 DIAGNOSIS — R2681 Unsteadiness on feet: Secondary | ICD-10-CM

## 2016-07-13 DIAGNOSIS — M6281 Muscle weakness (generalized): Secondary | ICD-10-CM | POA: Insufficient documentation

## 2016-07-13 DIAGNOSIS — R41840 Attention and concentration deficit: Secondary | ICD-10-CM | POA: Diagnosis present

## 2016-07-13 DIAGNOSIS — I69318 Other symptoms and signs involving cognitive functions following cerebral infarction: Secondary | ICD-10-CM | POA: Diagnosis present

## 2016-07-13 DIAGNOSIS — R2689 Other abnormalities of gait and mobility: Secondary | ICD-10-CM

## 2016-07-13 DIAGNOSIS — M25512 Pain in left shoulder: Secondary | ICD-10-CM | POA: Diagnosis present

## 2016-07-13 DIAGNOSIS — I69351 Hemiplegia and hemiparesis following cerebral infarction affecting right dominant side: Secondary | ICD-10-CM | POA: Diagnosis present

## 2016-07-13 DIAGNOSIS — Z7409 Other reduced mobility: Secondary | ICD-10-CM | POA: Insufficient documentation

## 2016-07-13 DIAGNOSIS — R278 Other lack of coordination: Secondary | ICD-10-CM | POA: Diagnosis present

## 2016-07-13 DIAGNOSIS — R29898 Other symptoms and signs involving the musculoskeletal system: Secondary | ICD-10-CM | POA: Insufficient documentation

## 2016-07-13 DIAGNOSIS — R269 Unspecified abnormalities of gait and mobility: Secondary | ICD-10-CM | POA: Diagnosis present

## 2016-07-13 DIAGNOSIS — M25612 Stiffness of left shoulder, not elsewhere classified: Secondary | ICD-10-CM | POA: Insufficient documentation

## 2016-07-13 NOTE — Therapy (Signed)
[facility] [address] [place], [place], [address] Phone: [phone]   Fax:  [phone]  Occupational Therapy Evaluation  Patient Details  Name: Melanie Hendricks MRN: [id number] Date of Birth: 01-10-1936 Referring Provider: Dr. Alysia Penna  Encounter Date: 07/13/2016      OT End of Session - 07/13/16 1003    Visit Number 1   Number of Visits 17   Date for OT Re-Evaluation 09/11/16   Authorization Type [employer] Medicare, no visit limit/auth; needs G-code   Authorization - Visit Number 1   Authorization - Number of Visits 10   OT Start Time [phone]   OT Stop Time 0930   OT Time Calculation (min) 41 min   Activity Tolerance Patient tolerated treatment well   Behavior During Therapy [facility] for tasks assessed/performed      Past Medical History:  Diagnosis Date  . Fx humeral neck   . Skin melanoma ([place])     Past Surgical History:  Procedure Laterality Date  . TONSILLECTOMY      There were no vitals filed for this visit.      Subjective Assessment - 07/13/16 0901    Subjective  No pain associated with the CVA   Patient is accompained by: Family member  daughter-in-law Jarrett Soho)   Pertinent History hx of fall; L humerus fx 05/14/16 (s/p 8 wks at time of eval)--WBAT, ok for ROM (Dr. Tania Ade following); monitored for skin CA hx; hearing loss (particularly R side)   Limitations fall risk; L humerus fx 05/14/16 (s/p 8 wks at time of eval)--WBAT, ok for ROM (Dr. Tania Ade following), no driving, hearing loss   Patient Stated Goals return to being active   Currently in Pain? Yes   Pain Score 1   0-10/10, 1/10 currently   Pain Location Shoulder  L wrist stiffness   Pain Orientation Left   Pain Descriptors / Indicators Aching;Sharp   Pain Type Acute pain   Pain Onset More than a month ago   Pain Frequency Intermittent   Aggravating Factors  movement   Pain Relieving Factors rest           [facility]  OT Assessment - 07/13/16 0001      Assessment   Diagnosis CVA with R hemiparesis, L humerus fx   Referring Provider Dr. Alysia Penna   Onset Date 06/23/16  humerus fx 05/14/16   Prior Therapy CIR     Precautions   Precautions Fall   Precaution Comments no driving  L shoulder aggressive ROM (per hospital notes), WBAT LUE     Restrictions   Weight Bearing Restrictions Yes   LUE Weight Bearing Weight bearing as tolerated  "more aggressive ROM" per Dr. Letta Pate notes     Balance Screen   Has the patient fallen in the past 6 months Yes   How many times? 1  with L humerus fx     Home  Environment   Family/patient expects to be discharged to: Private residence   Home Access Stairs  2   [place] One level   Lives With Son  and dtr-in-law     Prior Function   Level of Independence Independent;Independent with basic ADLs;Independent with household mobility without device;Independent with community mobility without device   Leisure enjoys gardening, mowing yard, walking outside, cooking     ADL   Eating/Feeding Independent   Grooming Independent   Upper Body Bathing Independent   Lower Body Bathing Independent   Upper  Body Dressing Independent   Lower Body Dressing Increased time  with shoes/socks due to RLE   Toilet Tranfer Independent   Toileting - Water engineer -  Network engineer bench  hand held shower head     IADL   Prior Level of Function Shopping independent   Shopping Needs to be accompanied on any shopping trip   Prior Level of Function Light Housekeeping mod I   Light Housekeeping --  not performing currently   Prior Level of Function Meal Prep independent    Meal Prep --  not performing currently   Prior Level of Function Community Mobility independent   Community Mobility --  not driving   Medication Management Takes  responsibility if medication is prepared in advance in seperate dosage     Mobility   Mobility Status History of falls  mod I with RW     Written Expression   Dominant Hand Right   Handwriting --  mild change     Vision - History   Baseline Vision Bifocals   Additional Comments pt reports light sensitivity and diplopia initially, but resolved now  denies difficulty with vision currently     Cognition   Overall Cognitive Status Impaired/Different from baseline  has to read slower and for shorter periods of time   Area of Impairment Attention;Memory;Safety/judgement   Attention Comments easily distracted    Memory Decreased short-term memory  decr memory of dates   Safety and Judgement Comments no deficits noted   Memory --     Sensation   Additional Comments denies change     Coordination   9 Hole Peg Test Left;Right   Right 9 Hole Peg Test 32.87   Left 9 Hole Peg Test 42.41     ROM / Strength   AROM / PROM / Strength AROM;PROM;Strength     AROM   Overall AROM  Deficits   Overall AROM Comments grossly WNL BUEs except L shoulder approx 90* shoulder flex/abduction and approx 50% ER/IR     PROM   Overall PROM  Deficits   Overall PROM Comments Grossly WNL BUEs except L shoulder with approx 100* gentle PROM shoulder flex/abduction, ER approx 90%     Strength   Overall Strength Deficits   Overall Strength Comments L shoulder not tested due to pain; RUE proximal strength grossly 4+/5     Hand Function   Right Hand Grip (lbs) 47   Left Hand Grip (lbs) 15                         OT Education - 07/13/16 0945    Education provided Yes   Education Details OT POC; continue with HEP from the hospital (table slides for flex/abduction; cane ex for IR and flex)   Person(s) Educated Patient;Caregiver(s)   Methods Explanation   Comprehension Verbalized understanding          OT Short Term Goals - 07/13/16 1111      OT SHORT TERM GOAL #1   Title Pt  will be independent with initial HEP--check STGs 08/11/16   Time 4   Period Weeks   Status New     OT SHORT TERM GOAL #2   Title Pt will improve coordination for ADLs as shown by improving time on 9-hole peg test by 5sec bilaterally.   Baseline R-32.87sec, L-42.41sec   Time 4  Period Weeks   Status New     OT SHORT TERM GOAL #3   Title Pt will demo at least 100* L shoulder flex for functional reaching/ADLs with 3/10 pain or less.   Baseline 90*   Time 4   Period Weeks   Status New     OT SHORT TERM GOAL #4   Title Pt will improve L grip strength by at least 5lbs to assist with opening containers/ADLs.   Baseline 15lbs   Time 4   Period Weeks   Status New     OT SHORT TERM GOAL #5   Title Pt will verbalize understanding of memory/cognitive compensation strategies for ADLs prn.   Baseline --   Time 4   Period Weeks   Status New     Additional Short Term Goals   Additional Short Term Goals Yes     OT SHORT TERM GOAL #6   Title Pt will perform simple snack prep/home maintenance task mod I.   Time 4   Period Weeks   Status New           OT Long Term Goals - 07/13/16 1117      OT LONG TERM GOAL #1   Title Pt will be independent with updated HEP.--check LTGs 09/11/16   Time 8   Period Weeks   Status New     OT LONG TERM GOAL #2   Title Pt will improve coordination for ADLs as shown by improving time on 9-hole peg test by 10sec with LUE.   Baseline 42.41sec   Time 8   Period Weeks   Status New     OT LONG TERM GOAL #3   Title Pt will demo at least 110* L shoulder flex for functional reaching/ADLs with less than 2/10 pain.   Baseline 90*   Time 8   Period Weeks   Status New     OT LONG TERM GOAL #4   Title Pt will improve L grip strength by at least 15lbs to assist with opening containers/ADLs.   Baseline 15lbs   Time 8   Period Weeks   Status New     OT LONG TERM GOAL #5   Title Pt will demo at least 100* L shoulder abduction for lateral  reaching/ADLs with less than 3/10 pain.   Baseline 90*   Time 8   Period Weeks   Status New     Long Term Additional Goals   Additional Long Term Goals Yes     OT LONG TERM GOAL #6   Title Pt will perform simple cooking tasks/meal prep mod I.   Time 8   Period Weeks   Status New     OT LONG TERM GOAL #7   Title Pt will use LUE as nondominant assist for ADLs/IADLs at least 85% of the time   Time 8   Period Weeks   Status New               Plan - 07/13/16 1004    Clinical Impression Statement Pt is a 80 y.o. female s/p L CVA with R hemiparesis and L humerus fx s/p fall approx 8 weeks ago.  Per epic notes pt is cleared for LUE WBAT and is needs "more aggressive ROM."  Pt with PMH that includes:  HTN, fall, and closed L humerus fx (05/14/16), L CVA (06/1716).  Pt presents with R hemiparesis, decr coordination, decr strength/ROM LUE, pain, decr functional mobility/balance for ADLs, and cognitive  defictis.  Pt would benefit from occupational therapy to improve UE functional use, incr ease of ADLs, and to incr independence with IADLs for improved quality of life as pt reports that she was active prior to CVA.     Rehab Potential Good   OT Frequency 2x / week   OT Duration 8 weeks  +eval   OT Treatment/Interventions Self-care/ADL training;Cryotherapy;Parrafin;Therapeutic exercise;DME and/or AE instruction;Therapist, nutritional;Therapeutic activities;Patient/family education;Balance training;Cognitive remediation/compensation;Splinting;Manual Therapy;Neuromuscular education;Fluidtherapy;Ultrasound;Electrical Stimulation;Moist Heat;Energy conservation;Passive range of motion;Therapeutic exercises   Plan ROM LUE; coordination/hand strength HEP   Consulted and Agree with Plan of Care Patient;Family member/caregiver   Family Member Consulted dtr-in-law Jarrett Soho)      Patient will benefit from skilled therapeutic intervention in order to improve the following deficits and  impairments:  Decreased coordination, Decreased knowledge of use of DME, Decreased strength, Impaired UE functional use, Decreased activity tolerance, Decreased range of motion, Pain, Decreased cognition, Decreased balance, Decreased mobility  Visit Diagnosis: Hemiplegia and hemiparesis following cerebral infarction affecting right dominant side (HCC)  Muscle weakness (generalized)  Other lack of coordination  Other abnormalities of gait and mobility  Stiffness of left shoulder, not elsewhere classified  Acute pain of left shoulder  Attention and concentration deficit  Other symptoms and signs involving cognitive functions following cerebral infarction  Unsteadiness on feet      G-Codes - 08-11-16 1012    Functional Assessment Tool Used L shoulder AROM 90* AROM shoulder flex/abduction, approx 50% ER/IR; L grip strength 15lbs; 9-hole peg test:  R-32.87sec, L-42.41sec   Functional Limitation Carrying, moving and handling objects   Carrying, Moving and Handling Objects Current Status [phone]) At least 40 percent but less than 60 percent impaired, limited or restricted   Carrying, Moving and Handling Objects Goal Status [id number]) At least 1 percent but less than 20 percent impaired, limited or restricted      Problem List Patient Active Problem List   Diagnosis Date Noted  . Post-operative pain   . Benign essential HTN   . Urinary retention   . Closed fracture of left proximal humerus   . Left pontine cerebrovascular accident ([place]) 06/26/2016  . Stroke (cerebrum) ([place]) 06/23/2016  . Blood pressure elevated without history of HTN 06/23/2016  . Left pontine stroke ([place]) 06/23/2016  . Stroke-like symptoms   . Eye movement disorder   . Closed fracture of neck of humerus with routine healing     [facility] 2016/08/11, 11:26 AM  [facility] [address] [place], [place], [address] Phone: [phone]   Fax:   [phone]  Name: MARISA KINES MRN: [id number] Date of Birth: Apr 21, 1936   Vianne Bulls, OTR/L [facility] [address] [place], [place]  [address] [phone] phone [phone] 2016/08/11 11:26 AM

## 2016-07-13 NOTE — Telephone Encounter (Signed)
Transitional care call completed, appointment confirmed, address confirmed, new visit packet has been mailed to house   Transitional Care Questions  Questions for our staff to ask patients on Transitional care 48 hour phone call:   1. Are you/is patient experiencing any problems since coming home? No Are there any questions regarding any aspect of care? No  2. Are there any questions regarding medications administration/dosing? No  Are meds being taken as prescribed? Yes  Patient should review meds with caller to confirm   3. Have there been any falls? No  4. Has Home Health been to the house and/or have they contacted you?  No, outpatient rehab  only If not, have you tried to contact them? Can we help you contact them?   5. Are bowels and bladder emptying properly? Yes Are there any unexpected incontinence issues? No If applicable, is patient following bowel/bladder programs?  6. Any fevers, problems with breathing, unexpected pain?   No   7. Are there any skin problems or new areas of breakdown? No  8. Has the patient/family member arranged specialty MD follow up (ie cardiology/neurology/renal/surgical/etc)? Yes Can we help arrange? No   9. Does the patient need any other services or support that we can help arrange? No  10. Are caregivers following through as expected in assisting the patient? Yes   11. Has the patient quit smoking, drinking alcohol, or using drugs as recommended? Patient does not smoke, does not drink, does not use illicit drugs

## 2016-07-13 NOTE — Patient Instructions (Signed)
Patient educated on POC and safety for fall prevention at home with daughter in law present.

## 2016-07-13 NOTE — Therapy (Addendum)
[facility] [address] [place], [place], [address] Phone: [phone]   Fax:  [phone]  Physical Therapy Evaluation  Patient Details  Name: Melanie Hendricks MRN: [id number] Date of Birth: 1936-05-24 Referring Provider: Ella Bodo  Encounter Date: 07/13/2016      PT End of Session - 07/13/16 1753    Visit Number 1   Number of Visits 16   Date for PT Re-Evaluation 09/14/16   Authorization Type Medicare, G-Code every 10th visit   PT Start Time 0930   PT Stop Time 1015   PT Time Calculation (min) 45 min   Activity Tolerance Patient tolerated treatment well   Behavior During Therapy [facility] for tasks assessed/performed      Past Medical History:  Diagnosis Date  . Fx humeral neck   . Skin melanoma ([place])     Past Surgical History:  Procedure Laterality Date  . TONSILLECTOMY      Vitals:   07/13/16 0936  BP: (!) 148/86  Pulse: (!) 108  SpO2: 95%         Subjective Assessment - 07/13/16 0939    Subjective Reports no falls, reports being careful and taking extra time when getting up.  Will be home alone starting tomorrow for about couple of hours at a time.     Patient is accompained by: Family member  daughter in law   Pertinent History Had CVA last month, completed inpiatient rehab last Friday   Limitations Standing;Walking;Other (comment);House hold activities  balance   Patient Stated Goals To return to usual self, driving, gardening, mowing yard, laundry, etc   Currently in Pain? Yes   Pain Score --  1/10 when stationary in L arm, up to 5/10 with reaching to limit of ROM   Pain Location Shoulder   Pain Orientation Left   Pain Descriptors / Indicators Aching;Sharp   Pain Type Acute pain   Pain Onset More than a month ago   Pain Frequency Intermittent   Aggravating Factors  movement   Pain Relieving Factors rest            [facility] PT Assessment - 07/13/16 0946      Assessment   Medical  Diagnosis CVA   Referring Provider Kirstens   Next MD Visit 11/27 w/ PCP     Precautions   Precautions Fall   Precaution Comments L shoulder no aggressive ROM     Restrictions   Weight Bearing Restrictions Yes   LUE Weight Bearing Weight bearing as tolerated     Balance Screen   Has the patient fallen in the past 6 months Yes   How many times? 1   Has the patient had a decrease in activity level because of a fear of falling?  Yes   Is the patient reluctant to leave their home because of a fear of falling?  No     Home Environment   Living Environment Private residence   Living Arrangements Children   Available Help at Discharge Family;Available PRN/intermittently   Type of Home House   Home Access Stairs to enter   Entrance Stairs-Number of Steps 2   Entrance Stairs-Rails None   Home Layout One level   [place] - 2 wheels;Bedside commode;Tub bench     Prior Function   Level of Independence Independent;Independent with household mobility without device;Independent with transfers;Independent with community mobility without device   Leisure enjoys gardening, mowing yard, walking outside, cooking     Cognition  Area of Impairment Attention;Memory   Attention Comments easily distracted    Memory Decreased short-term memory  difficulty with details     Sensation   Light Touch Appears Intact     Coordination   Gross Motor Movements are Fluid and Coordinated No   Fine Motor Movements are Fluid and Coordinated Not tested     ROM / Strength   AROM / PROM / Strength Strength     Strength   Overall Strength Deficits   Overall Strength Comments R LE grossly 4/5 L 5/5     Bed Mobility   Rolling Right 6: Modified independent (Device/Increase time)   Rolling Left Not tested (comment)  c/o pain with pressure on L UE    Supine to Sit 6: Modified independent (Device/Increase time)   Sit to Supine 6: Modified independent (Device/Increase time)     Transfers    Transfers Sit to Stand;Stand Pivot Transfers   Sit to Stand 6: Modified independent (Device/Increase time);With upper extremity assist;Without upper extremity assist   Stand Pivot Transfers 6: Modified independent (Device/Increase time);With armrests     Ambulation/Gait   Ambulation/Gait Yes   Ambulation/Gait Assistance 6: Modified independent (Device/Increase time)   Ambulation Distance (Feet) 150 Feet   Assistive device Rolling walker   Gait Pattern Step-through pattern;Decreased stride length;Lateral hip instability;Trunk flexed   Ambulation Surface Level;Indoor   Gait velocity 1.74 ft/sec; 0.53 m/s   Stairs Yes   Stairs Assistance 5: Supervision   Stairs Assistance Details (indicate cue type and reason) assist for safety, cue for technique   Stair Management Technique With walker   Number of Stairs 1  2 x curb step   Height of Stairs 7   Ramp 5: Supervision   Ramp Details (indicate cue type and reason) assist for safety     Balance   Balance Assessed Yes     Standardized Balance Assessment   Standardized Balance Assessment Berg Balance Test     Berg Balance Test   Sit to Stand Able to stand without using hands and stabilize independently   Standing Unsupported Able to stand 2 minutes with supervision   Sitting with Back Unsupported but Feet Supported on Floor or Stool Able to sit safely and securely 2 minutes   Stand to Sit Controls descent by using hands   Transfers Able to transfer safely, definite need of hands   Standing Unsupported with Eyes Closed Able to stand 10 seconds with supervision   Standing Ubsupported with Feet Together Able to place feet together independently and stand for 1 minute with supervision   From Standing, Reach Forward with Outstretched Arm Can reach forward >12 cm safely (5")   From Standing Position, Pick up Object from Floor Able to pick up shoe, needs supervision   From Standing Position, Turn to Look Behind Over each Shoulder Looks behind  from both sides and weight shifts well   Turn 360 Degrees Able to turn 360 degrees safely but slowly   Standing Unsupported, Alternately Place Feet on Step/Stool Able to complete >2 steps/needs minimal assist   Standing Unsupported, One Foot in Front Able to plae foot ahead of the other independently and hold 30 seconds   Standing on One Leg Tries to lift leg/unable to hold 3 seconds but remains standing independently   Total Score 40                           PT Education - 07/13/16 1753  Education Details POC for PT and fall prevention   Person(s) Educated Patient;Caregiver(s)   Methods Explanation   Comprehension Verbalized understanding          PT Short Term Goals - 07/13/16 1757      PT SHORT TERM GOAL #1   Title Patient will demonstrate decreased fall risk with Berg balance score 45/56 or greater.   Time 4   Period Weeks   Status New     PT SHORT TERM GOAL #2   Title Patient to complete TUG and set goal as appropriate for fall risk reduction.   Time 1   Period Weeks   Status New     PT SHORT TERM GOAL #3   Title Patient to verbalize CVA warning signs and risk factors with modifications for prevention.    Time 4   Period Weeks   Status New     PT SHORT TERM GOAL #4   Title Patient to ambulate indoor surfaces with cane and S over 300' no LOB.    Time 4   Period Weeks   Status New           PT Long Term Goals - 07/13/16 1759      PT LONG TERM GOAL #1   Title Patient to demonstrate decreased fall risk with Gait velocity >0.8 m/s   Time 8   Period Weeks   Status New     PT LONG TERM GOAL #2   Title Patient to be independent with HEP for balance and strength and walking program.   Time 8   Period Weeks   Status New     PT LONG TERM GOAL #3   Title Patient to ambulate indoors without device independent 300' no LOB   Time 8   Period Weeks   Status New     PT LONG TERM GOAL #4   Title Patient to ambulate outdoors with cane and  no LOB 500' paved and grassy surfaces.    Time 8   Period Weeks   Status New               Plan - 07/13/16 1755    Clinical Impression Statement Patient presents with decreased R LE strength, decreased balance with high fall risk with Berg score 40/56, decreased endurance and will benefit from skilled PT in the outpatient setting to progress to goals for independent with ambulation and return to gardening, etc.   Rehab Potential Good   PT Frequency 2x / week   PT Duration 8 weeks   PT Treatment/Interventions ADLs/Self Care Home Management;Functional mobility training;Stair training;Gait training;DME Instruction;Therapeutic activities;Therapeutic exercise;Neuromuscular re-education;Patient/family education;Balance training;Vestibular   PT Next Visit Plan Review HEP from rehab to see if safe or needs advancement, gait, R LE strength   Consulted and Agree with Plan of Care Patient;Family member/caregiver   Family Member Consulted daughter in law      Patient will benefit from skilled therapeutic intervention in order to improve the following deficits and impairments:  Abnormal gait, Impaired UE functional use, Decreased safety awareness, Decreased endurance, Decreased activity tolerance, Decreased balance, Decreased mobility, Decreased strength, Decreased knowledge of use of DME, Pain, Decreased cognition  Visit Diagnosis: Other abnormalities of gait and mobility - Plan: PT PLAN OF CARE CERT/RE-CERT  Hemiplegia and hemiparesis following cerebral infarction affecting right dominant side ([place]) - Plan: PT PLAN OF CARE CERT/RE-CERT  Unsteadiness on feet - Plan: PT PLAN OF CARE CERT/RE-CERT       G-Codes -  07/13/16 1802    Functional Assessment Tool Used Berg balance test, gait velocity   Functional Limitation Mobility: Walking and moving around   Mobility: Walking and Moving Around Current Status [phone]) At least 40 percent but less than 60 percent impaired, limited or restricted    Mobility: Walking and Moving Around Goal Status [phone]) At least 1 percent but less than 20 percent impaired, limited or restricted       Problem List Patient Active Problem List   Diagnosis Date Noted  . Post-operative pain   . Benign essential HTN   . Urinary retention   . Closed fracture of left proximal humerus   . Left pontine cerebrovascular accident ([place]) 06/26/2016  . Stroke (cerebrum) ([place]) 06/23/2016  . Blood pressure elevated without history of HTN 06/23/2016  . Left pontine stroke ([place]) 06/23/2016  . Stroke-like symptoms   . Eye movement disorder   . Closed fracture of neck of humerus with routine healing     Reginia Naas 07/13/2016, 6:05 PM  Magda Kiel, [place] 07/13/2016  [facility] [address] [place], [place], [address] Phone: [phone]   Fax:  [phone]  Name: Melanie Hendricks MRN: [id number] Date of Birth: 1936/06/15

## 2016-07-16 ENCOUNTER — Ambulatory Visit: Payer: Medicare Other | Admitting: Physical Therapy

## 2016-07-16 ENCOUNTER — Ambulatory Visit: Payer: Medicare Other | Admitting: Occupational Therapy

## 2016-07-16 VITALS — BP 100/65 | HR 97

## 2016-07-16 DIAGNOSIS — I69351 Hemiplegia and hemiparesis following cerebral infarction affecting right dominant side: Secondary | ICD-10-CM

## 2016-07-16 DIAGNOSIS — R278 Other lack of coordination: Secondary | ICD-10-CM

## 2016-07-16 DIAGNOSIS — M25612 Stiffness of left shoulder, not elsewhere classified: Secondary | ICD-10-CM

## 2016-07-16 DIAGNOSIS — M6281 Muscle weakness (generalized): Secondary | ICD-10-CM

## 2016-07-16 DIAGNOSIS — R2689 Other abnormalities of gait and mobility: Secondary | ICD-10-CM

## 2016-07-16 DIAGNOSIS — R2681 Unsteadiness on feet: Secondary | ICD-10-CM

## 2016-07-16 DIAGNOSIS — M25512 Pain in left shoulder: Secondary | ICD-10-CM

## 2016-07-16 NOTE — Patient Instructions (Signed)
KNEE: Extension, Long Arc Quad (Band)    Place band around ankles. Pull band forward until knee is straight. Hold ___ seconds. Use ______red__ band. __10_ reps per set, _2__ sets per day.  Repeat on other side.  Copyright  VHI. All rights reserved.   FLEXION: Standing - Resistance Band (Active)    Stand, both feet flat. Against red resistance band, lift right knee toward ceiling.  Repeat on other side. Complete __1_ sets of _10__ repetitions. Perform _2__ sessions per day.  Copyright  VHI. All rights reserved.  ABDUCTION: Standing - Resistance Band (Active)    Stand, feet flat. Against red resistance band, lift right leg out to side.  Repeat on other side. Complete __1_ sets of _10__ repetitions. Perform _2__ sessions per day.  http://gtsc.exer.us/117   Copyright  VHI. All rights reserved.  Single Limb Balance    Stand with wheelchair directly behind you and locked. Practice lifting hands from walker for a few seconds at a time to balance. Increase balance time as tolerated. Repeat __3__ times. Do _2___ sessions per day.  Can also do standing at counter.  Do 3 times on each side.  Copyright  VHI. All rights reserved.  FLEXION: Standing - Stable: Resistance Band (Active)    Against red resistance band, bend right knee, bringing heel toward buttocks.  Repeat on other side. Complete _1__ sets of _10__ repetitions. Perform _2__ sessions per day.  Copyright  VHI. All rights reserved.   Side-Stepping    Walk to left side with eyes open. Take even steps, leading with same foot. Make sure each foot lifts off the floor. Repeat in opposite direction. Repeat for _2___ minutes per session. Do _2___ sessions per day.   Copyright  VHI. All rights reserved.  Functional Quadriceps: Sit to Stand    Sit on edge of chair, feet flat on floor. Stand upright, extending knees fully. Repeat __10__ times per set. Do __1__ sets per session. Do _2___ sessions per  day.  http://orth.exer.us/735   Copyright  VHI. All rights reserved.

## 2016-07-16 NOTE — Patient Instructions (Addendum)
  Coordination Activities  Perform the following activities for 15 minutes 1 times per day with both hand(s).   Rotate ball in fingertips (clockwise and counter-clockwise).  Flip cards 1 at a time as fast as you can.  Keep shoulder down.  Turn palm up   Deal cards with your thumb (Hold deck in hand and push card off top with thumb).  Pick up coins, buttons, marbles, dried beans/pasta of different sizes and place in container.  Pick up coins and stack.  Pick up coins one at a time until you get 5-10 in your hand, then move coins from palm to fingertips to stack one at a time.

## 2016-07-16 NOTE — Therapy (Signed)
[facility] [address] [place], [place], [address] Phone: [phone]   Fax:  [phone]  Occupational Therapy Treatment  Patient Details  Name: Melanie Hendricks MRN: [id number] Date of Birth: 1935-11-28 Referring Provider: Dr. Alysia Penna  Encounter Date: 07/16/2016      OT End of Session - 07/16/16 0916    Visit Number 2   Number of Visits 17   Date for OT Re-Evaluation 09/11/16   Authorization Type [employer] Medicare, no visit limit/auth; needs G-code   Authorization - Visit Number 2   Authorization - Number of Visits 10   OT Start Time [phone]   OT Stop Time 0930   OT Time Calculation (min) 41 min   Activity Tolerance Patient tolerated treatment well   Behavior During Therapy [facility] for tasks assessed/performed      Past Medical History:  Diagnosis Date  . Fx humeral neck   . Skin melanoma ([place])     Past Surgical History:  Procedure Laterality Date  . TONSILLECTOMY      There were no vitals filed for this visit.      Subjective Assessment - 07/16/16 0914    Subjective  "The exercises are hard"   Pertinent History hx of fall; L humerus fx 05/14/16 (s/p 8 wks at time of eval)--WBAT, ok for ROM (Dr. Tania Ade following); monitored for skin CA hx; hearing loss (particularly R side)   Limitations fall risk; L humerus fx 05/14/16 (s/p 8 wks at time of eval)--WBAT, ok for ROM (Dr. Tania Ade following), no driving, hearing loss   Patient Stated Goals return to being active   Currently in Pain? Yes   Pain Score 3    Pain Location Shoulder   Pain Orientation Left   Pain Descriptors / Indicators Aching;Sharp   Pain Type Acute pain   Pain Onset More than a month ago   Pain Frequency Intermittent   Aggravating Factors  movement    Pain Relieving Factors rest       Reviewed UE HEP from the hospital:  Shoulder flex with vertical cane, ER with cane, and IR with cane, table slides for shoulder flex  and abduction.  Pt performed with min cueing to avoid compensation patterns with head/trunk.  In supine, shoulder abduction with cane, chest press with BUEs with ball with min cueing for normal movement patterns.                           OT Education - 07/16/16 1008    Education Details Coordination HEP for bilateral hands--see pt instructions   Person(s) Educated Patient;Child(ren)   Methods Explanation;Demonstration;Verbal cues;Handout   Comprehension Verbalized understanding;Returned demonstration;Verbal cues required  min cueing          OT Short Term Goals - 07/13/16 1111      OT SHORT TERM GOAL #1   Title Pt will be independent with initial HEP--check STGs 08/11/16   Time 4   Period Weeks   Status New     OT SHORT TERM GOAL #2   Title Pt will improve coordination for ADLs as shown by improving time on 9-hole peg test by 5sec bilaterally.   Baseline R-32.87sec, L-42.41sec   Time 4   Period Weeks   Status New     OT SHORT TERM GOAL #3   Title Pt will demo at least 100* L shoulder flex for functional reaching/ADLs with 3/10 pain or less.  Baseline 90*   Time 4   Period Weeks   Status New     OT SHORT TERM GOAL #4   Title Pt will improve L grip strength by at least 5lbs to assist with opening containers/ADLs.   Baseline 15lbs   Time 4   Period Weeks   Status New     OT SHORT TERM GOAL #5   Title Pt will verbalize understanding of memory/cognitive compensation strategies for ADLs prn.   Baseline --   Time 4   Period Weeks   Status New     Additional Short Term Goals   Additional Short Term Goals Yes     OT SHORT TERM GOAL #6   Title Pt will perform simple snack prep/home maintenance task mod I.   Time 4   Period Weeks   Status New           OT Long Term Goals - 07/13/16 1117      OT LONG TERM GOAL #1   Title Pt will be independent with updated HEP.--check LTGs 09/11/16   Time 8   Period Weeks   Status New     OT LONG TERM  GOAL #2   Title Pt will improve coordination for ADLs as shown by improving time on 9-hole peg test by 10sec with LUE.   Baseline 42.41sec   Time 8   Period Weeks   Status New     OT LONG TERM GOAL #3   Title Pt will demo at least 110* L shoulder flex for functional reaching/ADLs with less than 2/10 pain.   Baseline 90*   Time 8   Period Weeks   Status New     OT LONG TERM GOAL #4   Title Pt will improve L grip strength by at least 15lbs to assist with opening containers/ADLs.   Baseline 15lbs   Time 8   Period Weeks   Status New     OT LONG TERM GOAL #5   Title Pt will demo at least 100* L shoulder abduction for lateral reaching/ADLs with less than 3/10 pain.   Baseline 90*   Time 8   Period Weeks   Status New     Long Term Additional Goals   Additional Long Term Goals Yes     OT LONG TERM GOAL #6   Title Pt will perform simple cooking tasks/meal prep mod I.   Time 8   Period Weeks   Status New     OT LONG TERM GOAL #7   Title Pt will use LUE as nondominant assist for ADLs/IADLs at least 85% of the time   Time 8   Period Weeks   Status New               Plan - 07/16/16 0917    Clinical Impression Statement Pt with mild 3/10 initially at available end range with LUE, but improves with repetition/stretching with incr range available.     Rehab Potential Good   OT Frequency 2x / week   OT Duration 8 weeks  +eval   OT Treatment/Interventions Self-care/ADL training;Cryotherapy;Parrafin;Therapeutic exercise;DME and/or AE instruction;Therapist, nutritional;Therapeutic activities;Patient/family education;Balance training;Cognitive remediation/compensation;Splinting;Manual Therapy;Neuromuscular education;Fluidtherapy;Ultrasound;Electrical Stimulation;Moist Heat;Energy conservation;Passive range of motion;Therapeutic exercises   Plan continue with LUE ROM, putty HEP   OT Home Exercise Plan Education provided:  Reviewed HEP for LUE ROM from hospital (pt to  continue), coordination HEP    Consulted and Agree with Plan of Care Patient;Family member/caregiver   Family  Member Consulted dtr-in-law Jarrett Soho)      Patient will benefit from skilled therapeutic intervention in order to improve the following deficits and impairments:  Decreased coordination, Decreased knowledge of use of DME, Decreased strength, Impaired UE functional use, Decreased activity tolerance, Decreased range of motion, Pain, Decreased cognition, Decreased balance, Decreased mobility  Visit Diagnosis: Stiffness of left shoulder, not elsewhere classified  Hemiplegia and hemiparesis following cerebral infarction affecting right dominant side (H[facility])  Muscle weakness (generalized)  Other lack of coordination  Acute pain of left shoulder  Other abnormalities of gait and mobility    Problem List Patient Active Problem List   Diagnosis Date Noted  . Post-operative pain   . Benign essential HTN   . Urinary retention   . Closed fracture of left proximal humerus   . Left pontine cerebrovascular accident ([place]) 06/26/2016  . Stroke (cerebrum) ([place]) 06/23/2016  . Blood pressure elevated without history of HTN 06/23/2016  . Left pontine stroke ([place]) 06/23/2016  . Stroke-like symptoms   . Eye movement disorder   . Closed fracture of neck of humerus with routine healing     [facility] 07/16/2016, 12:23 PM  [facility] [address] [place], [place], [address] Phone: [phone]   Fax:  [phone]  Name: LETIA TRUMM MRN: [id number] Date of Birth: May 18, 1936   Vianne Bulls, OTR/L [facility] [address] [place], [place]  [address] [phone] phone [phone] 07/16/16 12:34 PM

## 2016-07-16 NOTE — Therapy (Addendum)
[place] [address] [place], [place], [address] Phone: [phone]   Fax:  [phone]  Physical Therapy Treatment  Patient Details  Name: Melanie Hendricks MRN: [id number] Date of Birth: 09-Jan-1936 Referring Provider: Ella Bodo  Encounter Date: 07/16/2016      PT End of Session - 07/16/16 1124    Visit Number 2   Number of Visits 16   Date for PT Re-Evaluation 09/14/16   Authorization Type Medicare, G-Code every 10th visit   PT Start Time 0808   PT Stop Time 0847   PT Time Calculation (min) 39 min   Activity Tolerance Patient tolerated treatment well   Behavior During Therapy [facility] for tasks assessed/performed      Past Medical History:  Diagnosis Date  . Fx humeral neck   . Skin melanoma ([place])     Past Surgical History:  Procedure Laterality Date  . TONSILLECTOMY      Vitals:   07/16/16 0700  BP: 100/65  Pulse: 97        Subjective Assessment - 07/16/16 0814    Subjective Denies falls or changes.  Feels like the exercises shes doing are getting easy.   Patient is accompained by: Family member  daughter in law   Pertinent History Had CVA last month, completed inpiatient rehab last Friday   Limitations Standing;Walking;Other (comment);House hold activities   Patient Stated Goals To return to usual self, driving, gardening, mowing yard, laundry, etc   Currently in Pain? No/denies                         [facility] Adult PT Treatment/Exercise - 07/16/16 0001      Exercises   Exercises Knee/Hip     Knee/Hip Exercises: Aerobic   Other Aerobic Scifit level 2.0 bil LE's only x 9 minutes       Treatment session focused on performing and developing HEP.  See patient instruction for details.         PT Education - 07/16/16 1122    Education provided Yes   Education Details HEP   Person(s) Educated Patient;Child(ren)   Methods Explanation;Demonstration;Verbal cues;Handout   Comprehension Verbalized understanding          PT Short Term Goals - 07/13/16 1757      PT SHORT TERM GOAL #1   Title Patient will demonstrate decreased fall risk with Berg balance score 45/56 or greater.   Time 4   Period Weeks   Status New     PT SHORT TERM GOAL #2   Title Patient to complete TUG and set goal as appropriate for fall risk reduction.   Time 1   Period Weeks   Status New     PT SHORT TERM GOAL #3   Title Patient to verbalize CVA warning signs and risk factors with modifications for prevention.    Time 4   Period Weeks   Status New     PT SHORT TERM GOAL #4   Title Patient to ambulate indoor surfaces with cane and S over 300' no LOB.    Time 4   Period Weeks   Status New           PT Long Term Goals - 07/13/16 1759      PT LONG TERM GOAL #1   Title Patient to demonstrate decreased fall risk with Gait velocity >0.8 m/s   Time 8   Period Weeks   Status New  PT LONG TERM GOAL #2   Title Patient to be independent with HEP for balance and strength and walking program.   Time 8   Period Weeks   Status New     PT LONG TERM GOAL #3   Title Patient to ambulate indoors without device independent 300' no LOB   Time 8   Period Weeks   Status New     PT LONG TERM GOAL #4   Title Patient to ambulate outdoors with cane and no LOB 500' paved and grassy surfaces.    Time 8   Period Weeks   Status New               Plan - 07/16/16 1125    Clinical Impression Statement Treatment today consisted of developing HEP.  Pt continues with decreased strength and endurance.  Continue PT per POC.   Rehab Potential Good   PT Frequency 2x / week   PT Duration 8 weeks   PT Treatment/Interventions ADLs/Self Care Home Management;Functional mobility training;Stair training;Gait training;DME Instruction;Therapeutic activities;Therapeutic exercise;Neuromuscular re-education;Patient/family education;Balance training;Vestibular   PT Next Visit Plan Continue  RLE strengthening and gait and balance.   Consulted and Agree with Plan of Care Patient;Family member/caregiver   Family Member Consulted daughter in law      Patient will benefit from skilled therapeutic intervention in order to improve the following deficits and impairments:  Abnormal gait, Impaired UE functional use, Decreased safety awareness, Decreased endurance, Decreased activity tolerance, Decreased balance, Decreased mobility, Decreased strength, Decreased knowledge of use of DME, Pain, Decreased cognition  Visit Diagnosis: Other abnormalities of gait and mobility  Hemiplegia and hemiparesis following cerebral infarction affecting right dominant side (HCC)  Unsteadiness on feet     Problem List Patient Active Problem List   Diagnosis Date Noted  . Post-operative pain   . Benign essential HTN   . Urinary retention   . Closed fracture of left proximal humerus   . Left pontine cerebrovascular accident ([place]) 06/26/2016  . Stroke (cerebrum) ([place]) 06/23/2016  . Blood pressure elevated without history of HTN 06/23/2016  . Left pontine stroke ([place]) 06/23/2016  . Stroke-like symptoms   . Eye movement disorder   . Closed fracture of neck of humerus with routine healing     Narda Bonds 07/16/2016, 11:27 AM  [place] [address] [place], [place], [address] Phone: [phone]   Fax:  [phone]  Name: Melanie Hendricks MRN: [id number] Date of Birth: [birth year]  Narda Bonds, [place] 07/16/16 11:29 AM Phone: [phone] Fax: [phone]

## 2016-07-21 ENCOUNTER — Ambulatory Visit (HOSPITAL_BASED_OUTPATIENT_CLINIC_OR_DEPARTMENT_OTHER): Payer: Medicare Other | Admitting: Physical Medicine & Rehabilitation

## 2016-07-21 ENCOUNTER — Encounter: Payer: Medicare Other | Attending: Physical Medicine & Rehabilitation

## 2016-07-21 ENCOUNTER — Encounter: Payer: Self-pay | Admitting: Physical Medicine & Rehabilitation

## 2016-07-21 ENCOUNTER — Encounter: Payer: Self-pay | Admitting: *Deleted

## 2016-07-21 VITALS — BP 138/83 | HR 89 | Resp 14

## 2016-07-21 DIAGNOSIS — I69398 Other sequelae of cerebral infarction: Secondary | ICD-10-CM | POA: Insufficient documentation

## 2016-07-21 DIAGNOSIS — Z006 Encounter for examination for normal comparison and control in clinical research program: Secondary | ICD-10-CM

## 2016-07-21 DIAGNOSIS — M24571 Contracture, right ankle: Secondary | ICD-10-CM | POA: Insufficient documentation

## 2016-07-21 DIAGNOSIS — M24512 Contracture, left shoulder: Secondary | ICD-10-CM | POA: Diagnosis not present

## 2016-07-21 DIAGNOSIS — R269 Unspecified abnormalities of gait and mobility: Secondary | ICD-10-CM

## 2016-07-21 DIAGNOSIS — I639 Cerebral infarction, unspecified: Secondary | ICD-10-CM

## 2016-07-21 DIAGNOSIS — I6350 Cerebral infarction due to unspecified occlusion or stenosis of unspecified cerebral artery: Secondary | ICD-10-CM | POA: Insufficient documentation

## 2016-07-21 NOTE — Progress Notes (Signed)
[place] study month 1 office visit completed. No changes in medications, no ED visits or Hospitalizations. Next research visit due no later than 04/MAY/2018. Questions encouraged and answered.

## 2016-07-21 NOTE — Progress Notes (Signed)
Subjective:  Transitional care from hospitalization at [facility] inpatient stroke rehabilitation DATE OF ADMISSION:  06/25/2016 DATE OF DISCHARGE:  07/10/2016  Patient ID: Melanie Hendricks, female    DOB: 10/15/1935, 80 y.o.   MRN: [id number] 80 year old right-handed female with history of recent left humerus fracture 5 weeks ago after a fall, maintained with nonweightbearing.  Presented on June 23, 2016, with slurred speech, right-sided weakness, facial droop.  She lives with her children, independent prior to admission.  One level home.  CT, MRI showed acute ischemic nonhemorrhagic left paramedian pontine infarct. 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  Carotid Dopplers with no obvious ICA stenosis. Echocardiogram with ejection fraction of 123456, grade 1 diastolic dysfunction.  EEG, no seizure activity.  Neurology consulted, placed on aspirin and Plavix for CVA prophylaxis. HPI No falls Dressing and bathing with supervision No cooking except some microwaving Attending OP PT OT twice a week x 8Wk scheduled Still has pain in Left shoulder , some pressure in Left ear  Discussed fall in Sept, she caught her Right foot on the threshold Pain Inventory Average Pain 2 Pain Right Now 0 My pain is sharp  In the last 24 hours, has pain interfered with the following? General activity 2 Relation with others 0 Enjoyment of life 0 What TIME of day is your pain at its worst? night Sleep (in general) Fair  Pain is worse with: some activites Pain improves with: rest, heat/ice, therapy/exercise and medication Relief from Meds: 7  Mobility use a walker ability to climb steps?  yes do you drive?  no Do you have any goals in this area?  yes  Function retired I need assistance with the following:  meal prep, household duties and shopping Do you have any goals in this area?   yes  Neuro/Psych weakness trouble walking  Prior Studies Any changes since last visit?  no  Physicians involved in your care Any changes since last visit?  no   Family History  Problem Relation Age of Onset  . Stomach cancer Mother   . Thyroid cancer Father   . Heart attack Father   . Seizures Brother   . Heart failure Brother    Social History   Social History  . Marital status: Legally Separated    Spouse name: N/A  . Number of children: N/A  . Years of education: N/A   Social History Main Topics  . Smoking status: Never Smoker  . Smokeless tobacco: Never Used  . Alcohol use No  . Drug use: No  . Sexual activity: Not Asked   Other Topics Concern  . None   Social History Narrative  . None   Past Surgical History:  Procedure Laterality Date  . TONSILLECTOMY     Past Medical History:  Diagnosis Date  . Fx humeral neck   . Skin melanoma (HCC)    BP 138/83   Pulse 89   Resp 14   SpO2 96%   Opioid Risk Score:   Fall Risk Score:  `1  Depression screen PHQ 2/9  Depression screen PHQ 2/9 07/21/2016  Decreased Interest 0  Down, Depressed, Hopeless 0  PHQ - 2 Score 0  Altered sleeping 1  Tired, decreased energy 1  Change in appetite 0  Feeling bad or failure about yourself  0  Trouble concentrating 0  Moving slowly or fidgety/restless 0  Suicidal thoughts  0  PHQ-9 Score 2    Review of Systems  Respiratory: Positive for cough and shortness of breath.   All other systems reviewed and are negative.      Objective:   Physical Exam  Constitutional: She is oriented to person, place, and time. She appears well-developed and well-nourished. No distress.  HENT:  Head: Normocephalic and atraumatic.  Eyes: Conjunctivae and EOM are normal. Pupils are equal, round, and reactive to light.  Neck: Normal range of motion.  Cardiovascular: Normal rate, regular rhythm and normal heart sounds.   No murmur heard. Pulmonary/Chest: Effort normal. No  respiratory distress. She has wheezes.  Abdominal: Soft. Bowel sounds are normal. She exhibits no distension. There is no tenderness.  Musculoskeletal:       Left shoulder: She exhibits decreased range of motion.  Reduced range of motion left shoulder 80 of abduction, 90 of forward flexion. Minimal external rotation. Pain with range of motion  Neurological: She is alert and oriented to person, place, and time. No sensory deficit. She displays a negative Romberg sign. Coordination and gait abnormal.  5 minus in the right deltoid, biceps, triceps, grip, 3-/5 in the left deltoid, 5/5 biceps, triceps, grip 5 minus in the. Right hip flexor, knee extensor 4 minus in the left ankle dorsiflexor, 5/5 in the left hip flexor, knee extensor, ankle dorsi flexor  Requires handheld assistance times 1 for ambulation. No evidence of toe drag or knee instability.  Psychiatric: She has a normal mood and affect. Her speech is normal. Judgment and thought content normal. She is slowed. Cognition and memory are normal.  Nursing note and vitals reviewed. Right ankle contracture    Assessment & Plan:  1. Left pontine infarct with right sided fine motor deficits as well as gait disorder related to stroke Continue outpatient therapy, anticipate progression to straight cane in the next week or so  Continue outpatient PT and OT twice a week Continue aspirin and clopidogrel, Sees Neuro Dr Leonie Man on Thursday Dec 7  2. Hypertension continue lisinopril 3. Hyperlipidemia. Continue atorvastatin Sees PCP 11/27, they will assume prescriptions 4. History of left humeral fracture, slowly improving with range of motion in the left shoulder. Pain relatively well controlled. Patient does not feel like she needs some more tramadol today. Sees Ortho Dr Tamera Punt on 11/17  5. Right ankle contracture have given her exercises to help.

## 2016-07-21 NOTE — Patient Instructions (Signed)
Achilles Contracture is a disorder of the Achilles tendon. The Achilles tendon connects the large calf muscles (Gastrocnemius and Soleus) to the heel bone (calcaneus). This tendon is sometimes called the heel cord. It is important for pushing-off and standing on your toes and is important for walking, running, or jumping.  SYMPTOMS  Pain, tenderness, swelling, warmth, and redness may occur over the Achilles tendon even at rest.  Pain with pushing off, or flexing or extending the ankle.  Pain that is worsened after or during activity. CAUSES   Overuse sometimes seen with rapid increase in exercise programs or in sports requiring running and jumping.  Poor physical conditioning (strength and flexibility or endurance).  Running sports, especially training running down hills.  Inadequate warm-up before practice or play or failure to stretch before participation.  Injury to the tendon. PREVENTION   Warm up and stretch before practice or competition.  Allow time for adequate rest and recovery between practices and competition.  Keep up conditioning.  Keep up ankle and leg flexibility.  Improve or keep muscle strength and endurance.  Improve cardiovascular fitness.  Use proper technique.  Use proper equipment (shoes, skates).  To help prevent recurrence, taping, protective strapping, or an adhesive bandage may be recommended for several weeks after healing is complete. PROGNOSIS   Recovery may take weeks to several months to heal.  Longer recovery is expected if symptoms have been prolonged.  Recovery is usually quicker if the inflammation is due to a direct blow as compared with overuse or sudden strain. RELATED COMPLICATIONS   Healing time will be prolonged if the condition is not correctly treated. The injury must be given plenty of time to heal.  Symptoms can reoccur if activity is resumed too soon.  Untreated, tendinitis may increase the risk of tendon rupture  requiring additional time for recovery and possibly surgery. TREATMENT   The first treatment consists of rest anti-inflammatory medication, and ice to relieve the pain.  Stretching and strengthening exercises after resolution of pain will likely help reduce the risk of recurrence. Referral to a physical therapist or athletic trainer for further evaluation and treatment may be helpful.  A walking boot or cast may be recommended to rest the Achilles tendon. This can help break the cycle of inflammation and microtrauma.  Arch supports (orthotics) may be prescribed or recommended by your caregiver as an adjunct to therapy and rest.  Surgery to remove the inflamed tendon lining or degenerated tendon tissue is rarely necessary and has shown less than predictable results. MEDICATION   Nonsteroidal anti-inflammatory medications, such as aspirin and ibuprofen, may be used for pain and inflammation relief. Do not take within 7 days before surgery. Take these as directed by your caregiver. Contact your caregiver immediately if any bleeding, stomach upset, or signs of allergic reaction occur. Other minor pain relievers, such as acetaminophen, may also be used.  Pain relievers may be prescribed as necessary by your caregiver. Do not take prescription pain medication for longer than 4 to 7 days. Use only as directed and only as much as you need.  Cortisone injections are rarely indicated. Cortisone injections may weaken tendons and predispose to rupture. It is better to give the condition more time to heal than to use them. HEAT AND COLD  Cold is used to relieve pain and reduce inflammation for acute and chronic Achilles tendinitis. Cold should be applied for 10 to 15 minutes every 2 to 3 hours for inflammation and pain and immediately after any  activity that aggravates your symptoms. Use ice packs or an ice massage.  Heat may be used before performing stretching and strengthening activities prescribed by  your caregiver. Use a heat pack or a warm soak. SEEK MEDICAL CARE IF:  Symptoms get worse or do not improve in 2 weeks despite treatment.  New, unexplained symptoms develop. Drugs used in treatment may produce side effects. EXERCISES RANGE OF MOTION (ROM) AND STRETCHING EXERCISES - Achilles Tendinitis  These exercises may help you when beginning to rehabilitate your injury. Your symptoms may resolve with or without further involvement from your physician, physical therapist or athletic trainer. While completing these exercises, remember:   Restoring tissue flexibility helps normal motion to return to the joints. This allows healthier, less painful movement and activity.  An effective stretch should be held for at least 30 seconds.  A stretch should never be painful. You should only feel a gentle lengthening or release in the stretched tissue. STRETCH - Gastroc, Standing   Place hands on wall.  Extend right / left leg, keeping the front knee somewhat bent.  Slightly point your toes inward on your back foot.  Keeping your right / left heel on the floor and your knee straight, shift your weight toward the wall, not allowing your back to arch.  You should feel a gentle stretch in the right / left calf. Hold this position for __________ seconds. Repeat __________ times. Complete this stretch __________ times per day. STRETCH - Soleus, Standing   Place hands on wall.  Extend right / left leg, keeping the other knee somewhat bent.  Slightly point your toes inward on your back foot.  Keep your right / left heel on the floor, bend your back knee, and slightly shift your weight over the back leg so that you feel a gentle stretch deep in your back calf.  Hold this position for __________ seconds. Repeat __________ times. Complete this stretch __________ times per day. STRETCH - Gastrocsoleus, Standing  Note: This exercise can place a lot of stress on your foot and ankle. Please complete  this exercise only if specifically instructed by your caregiver.   Place the ball of your right / left foot on a step, keeping your other foot firmly on the same step.  Hold on to the wall or a rail for balance.  Slowly lift your other foot, allowing your body weight to press your heel down over the edge of the step.  You should feel a stretch in your right / left calf.  Hold this position for __________ seconds.  Repeat this exercise with a slight bend in your knee. Repeat __________ times. Complete this stretch __________ times per day.  STRENGTHENING EXERCISES - Achilles Tendinitis These exercises may help you when beginning to rehabilitate your injury. They may resolve your symptoms with or without further involvement from your physician, physical therapist or athletic trainer. While completing these exercises, remember:   Muscles can gain both the endurance and the strength needed for everyday activities through controlled exercises.  Complete these exercises as instructed by your physician, physical therapist or athletic trainer. Progress the resistance and repetitions only as guided.  You may experience muscle soreness or fatigue, but the pain or discomfort you are trying to eliminate should never worsen during these exercises. If this pain does worsen, stop and make certain you are following the directions exactly. If the pain is still present after adjustments, discontinue the exercise until you can discuss the trouble with your clinician.  STRENGTH - Plantar-flexors   Sit with your right / left leg extended. Holding onto both ends of a rubber exercise band/tubing, loop it around the ball of your foot. Keep a slight tension in the band.  Slowly push your toes away from you, pointing them downward.  Hold this position for __________ seconds. Return slowly, controlling the tension in the band/tubing. Repeat __________ times. Complete this exercise __________ times per day.    STRENGTH - Plantar-flexors   Stand with your feet shoulder width apart. Steady yourself with a wall or table using as little support as needed.  Keeping your weight evenly spread over the width of your feet, rise up on your toes.*  Hold this position for __________ seconds. Repeat __________ times. Complete this exercise __________ times per day.  *If this is too easy, shift your weight toward your right / left leg until you feel challenged. Ultimately, you may be asked to do this exercise with your right / left foot only. STRENGTH - Plantar-flexors, Eccentric  Note: This exercise can place a lot of stress on your foot and ankle. Please complete this exercise only if specifically instructed by your caregiver.   Place the balls of your feet on a step. With your hands, use only enough support from a wall or rail to keep your balance.  Keep your knees straight and rise up on your toes.  Slowly shift your weight entirely to your right / left toes and pick up your opposite foot. Gently and with controlled movement, lower your weight through your right / left foot so that your heel drops below the level of the step. You will feel a slight stretch in the back of your calf at the end position.  Use the healthy leg to help rise up onto the balls of both feet, then lower weight only on the right / left leg again. Build up to 15 repetitions. Then progress to 3 consecutive sets of 15 repetitions.*  After completing the above exercise, complete the same exercise with a slight knee bend (about 30 degrees). Again, build up to 15 repetitions. Then progress to 3 consecutive sets of 15 repetitions.* Perform this exercise __________ times per day.  *When you easily complete 3 sets of 15, your physician, physical therapist or athletic trainer may advise you to add resistance by wearing a backpack filled with additional weight. STRENGTH - Plantar Flexors, Seated   Sit on a chair that allows your feet to rest  flat on the ground. If necessary, sit at the edge of the chair.  Keeping your toes firmly on the ground, lift your right / left heel as far as you can without increasing any discomfort in your ankle. Repeat __________ times. Complete this exercise __________ times a day. *If instructed by your physician, physical therapist or athletic trainer, you may add ____________________ of resistance by placing a weighted object on your right / left knee.   This information is not intended to replace advice given to you by your health care provider. Make sure you discuss any questions you have with your health care provider.   Document Released: 03/25/2005 Document Revised: 09/14/2014 Document Reviewed: 12/06/2008 Elsevier Interactive Patient Education [employer].

## 2016-07-22 ENCOUNTER — Ambulatory Visit: Payer: Medicare Other | Admitting: *Deleted

## 2016-07-22 ENCOUNTER — Ambulatory Visit: Payer: Medicare Other | Admitting: Physical Therapy

## 2016-07-22 ENCOUNTER — Encounter: Payer: Self-pay | Admitting: *Deleted

## 2016-07-22 DIAGNOSIS — I69351 Hemiplegia and hemiparesis following cerebral infarction affecting right dominant side: Secondary | ICD-10-CM

## 2016-07-22 DIAGNOSIS — R2689 Other abnormalities of gait and mobility: Secondary | ICD-10-CM

## 2016-07-22 DIAGNOSIS — M25612 Stiffness of left shoulder, not elsewhere classified: Secondary | ICD-10-CM

## 2016-07-22 DIAGNOSIS — M6281 Muscle weakness (generalized): Secondary | ICD-10-CM

## 2016-07-22 DIAGNOSIS — M25512 Pain in left shoulder: Secondary | ICD-10-CM

## 2016-07-22 DIAGNOSIS — R278 Other lack of coordination: Secondary | ICD-10-CM

## 2016-07-22 DIAGNOSIS — R2681 Unsteadiness on feet: Secondary | ICD-10-CM

## 2016-07-22 NOTE — Therapy (Signed)
[facility] [address] [place], [place], [address] Phone: [phone]   Fax:  [phone]  Occupational Therapy Treatment  Patient Details  Name: Melanie Hendricks MRN: [id number] Date of Birth: 11-12-1935 Referring Provider: Dr. Alysia Penna  Encounter Date: 07/22/2016      OT End of Session - 07/22/16 1030    Visit Number 3   Number of Visits 17   Date for OT Re-Evaluation 09/11/16   Authorization Type [employer] Medicare, no visit limit/auth; needs G-code   Authorization - Visit Number 3   Authorization - Number of Visits 10   OT Start Time 0930   OT Stop Time 1013   OT Time Calculation (min) 43 min   Activity Tolerance Patient tolerated treatment well   Behavior During Therapy [facility] for tasks assessed/performed      Past Medical History:  Diagnosis Date  . Fx humeral neck   . Skin melanoma ([place])     Past Surgical History:  Procedure Laterality Date  . TONSILLECTOMY      There were no vitals filed for this visit.      Subjective Assessment - 07/22/16 0932    Subjective  "I think it's getting better a little bit"    Patient is accompained by: Family member  daughter in [place]   Pertinent History hx of fall; L humerus fx 05/14/16 (s/p 8 wks at time of eval)--WBAT, ok for ROM (Dr. Tania Ade following); monitored for skin CA hx; hearing loss (particularly R side)   Limitations fall risk; L humerus fx 05/14/16 (s/p 8 wks at time of eval)--WBAT, ok for ROM (Dr. Tania Ade following), no driving, hearing loss   Patient Stated Goals return to being active   Currently in Pain? Yes   Pain Score 3    Pain Location Shoulder   Pain Orientation Left   Pain Descriptors / Indicators Aching;Sharp   Pain Type Acute pain   Pain Onset More than a month ago   Pain Frequency Intermittent   Aggravating Factors  Movement   Pain Relieving Factors Rest   Multiple Pain Sites No                       OT Treatments/Exercises (OP) - 07/22/16 0001      Exercises   Exercises Hand     Fine Motor Coordination   Fine Motor Coordination In hand manipuation training;Small Pegboard;Manipulation of small objects;Flipping cards;Dealing card with thumb;Picking up coins;Stacking coins;Tossing ball;Nuts and Bolts   Other Fine Motor Exercises Performed fine motor/coordination exs as stated above with R hand. Added to HEP and pt/daughter i nlaw instructed to perform 2-3 x/day for 24min sessions. Also discussed handwriting, practice signing her name, holding pwn in right hand and circling words in word find etc. Also discussed stringing beads, buttons, crocheting and other FM ex's in clinic today.   Other Fine Motor Exercises Mod difficulty noted following small peg board pattern - cont to assess visual/perception in functional setting.       Fine Motor Coordination Exercises  Perform the following exercises 2-3 times a day, for 10 min, as recommended by your occupational therapist.  Reynold Bowen of hand on table, spread fingers apart, then together. (10 times) Lift fingers and thumb off table one at a time. Increase speed as able. (10 times) Touch thumb to each fingertip. Increase speed as able. (10 times) Pick up 5 small objects (coins, marbles, paperclips, beads, etc.) one at a  time and hold them in hand, then place them one by one onto the table. Pick up small objects and place them into a cup or container. Thread buttons or beads onto a string. Play card games. Practice shuffling and dealing cards. Flip cards over onto table one by one. Stack approximately [employer] (checkers, coins, etc.) onto table. Practice writing skills, dot to dot, puzzles, etc. With tweezers, pick up small objects and put into a small container. Try sorting beads or buttons.           OT Education - 07/22/16 1029    Education provided Yes   Education Details Reviewed HEP for cane  ex's, upgraded for FM/coordination RUE.   Person(s) Educated Patient;Child(ren)   Methods Explanation;Demonstration;Verbal cues;Handout   Comprehension Verbalized understanding          OT Short Term Goals - 07/13/16 1111      OT SHORT TERM GOAL #1   Title Pt will be independent with initial HEP--check STGs 08/11/16   Time 4   Period Weeks   Status New     OT SHORT TERM GOAL #2   Title Pt will improve coordination for ADLs as shown by improving time on 9-hole peg test by 5sec bilaterally.   Baseline R-32.87sec, L-42.41sec   Time 4   Period Weeks   Status New     OT SHORT TERM GOAL #3   Title Pt will demo at least 100* L shoulder flex for functional reaching/ADLs with 3/10 pain or less.   Baseline 90*   Time 4   Period Weeks   Status New     OT SHORT TERM GOAL #4   Title Pt will improve L grip strength by at least 5lbs to assist with opening containers/ADLs.   Baseline 15lbs   Time 4   Period Weeks   Status New     OT SHORT TERM GOAL #5   Title Pt will verbalize understanding of memory/cognitive compensation strategies for ADLs prn.   Baseline --   Time 4   Period Weeks   Status New     Additional Short Term Goals   Additional Short Term Goals Yes     OT SHORT TERM GOAL #6   Title Pt will perform simple snack prep/home maintenance task mod I.   Time 4   Period Weeks   Status New           OT Long Term Goals - 07/13/16 1117      OT LONG TERM GOAL #1   Title Pt will be independent with updated HEP.--check LTGs 09/11/16   Time 8   Period Weeks   Status New     OT LONG TERM GOAL #2   Title Pt will improve coordination for ADLs as shown by improving time on 9-hole peg test by 10sec with LUE.   Baseline 42.41sec   Time 8   Period Weeks   Status New     OT LONG TERM GOAL #3   Title Pt will demo at least 110* L shoulder flex for functional reaching/ADLs with less than 2/10 pain.   Baseline 90*   Time 8   Period Weeks   Status New     OT LONG TERM  GOAL #4   Title Pt will improve L grip strength by at least 15lbs to assist with opening containers/ADLs.   Baseline 15lbs   Time 8   Period Weeks   Status New     OT LONG  TERM GOAL #5   Title Pt will demo at least 100* L shoulder abduction for lateral reaching/ADLs with less than 3/10 pain.   Baseline 90*   Time 8   Period Weeks   Status New     Long Term Additional Goals   Additional Long Term Goals Yes     OT LONG TERM GOAL #6   Title Pt will perform simple cooking tasks/meal prep mod I.   Time 8   Period Weeks   Status New     OT LONG TERM GOAL #7   Title Pt will use LUE as nondominant assist for ADLs/IADLs at least 85% of the time   Time 8   Period Weeks   Status New               Plan - 07/22/16 1031    Clinical Impression Statement Pt reports increased flexibility LUE with performance of HEP/cane ex's. Focus today on coordination techniques for RUE should assist with improved coordination for daily activities.   Rehab Potential Good   OT Frequency 2x / week   OT Duration 8 weeks  +eval   OT Treatment/Interventions Self-care/ADL training;Cryotherapy;Parrafin;Therapeutic exercise;DME and/or AE instruction;Therapist, nutritional;Therapeutic activities;Patient/family education;Balance training;Cognitive remediation/compensation;Splinting;Manual Therapy;Neuromuscular education;Fluidtherapy;Ultrasound;Electrical Stimulation;Moist Heat;Energy conservation;Passive range of motion;Therapeutic exercises   Plan Cane ex's, LUE ROM. Add putty HEP and review coordination.   OT Home Exercise Plan Education provided:  Reviewed HEP for LUE ROM from hospital (pt to continue), coordination HEP    Consulted and Agree with Plan of Care Patient;Family member/caregiver   Family Member Consulted dtr-in-law Jarrett Soho)      Patient will benefit from skilled therapeutic intervention in order to improve the following deficits and impairments:  Decreased coordination, Decreased  knowledge of use of DME, Decreased strength, Impaired UE functional use, Decreased activity tolerance, Decreased range of motion, Pain, Decreased cognition, Decreased balance, Decreased mobility  Visit Diagnosis: Other lack of coordination  Acute pain of left shoulder  Stiffness of left shoulder, not elsewhere classified  Hemiplegia and hemiparesis following cerebral infarction affecting right dominant side (HCC)  Muscle weakness (generalized)    Problem List Patient Active Problem List   Diagnosis Date Noted  . Gait disturbance, post-stroke 07/21/2016  . Ankle contracture, right 07/21/2016  . Post-operative pain   . Benign essential HTN   . Urinary retention   . Closed fracture of left proximal humerus   . Left pontine cerebrovascular accident ([place]) 06/26/2016  . Stroke (cerebrum) ([place]) 06/23/2016  . Blood pressure elevated without history of HTN 06/23/2016  . Left pontine stroke ([place]) 06/23/2016  . Stroke-like symptoms   . Eye movement disorder   . Closed fracture of neck of humerus with routine healing     Michael Ventresca Ardath Sax, OTR/L 07/22/2016, 10:34 AM  [facility] [address] [place], [place], [address] Phone: [phone]   Fax:  [phone]  Name: Melanie Hendricks MRN: [id number] Date of Birth: 1936/06/22

## 2016-07-22 NOTE — Therapy (Addendum)
[facility] [address] [place], [place], [address] Phone: [phone]   Fax:  [phone]  Physical Therapy Treatment  Patient Details  Name: Melanie Hendricks MRN: [id number] Date of Birth: Apr 22, 1936 Referring Provider: Ella Bodo  Encounter Date: 07/22/2016      PT End of Session - 07/22/16 1109    Visit Number 3   Number of Visits 16   Date for PT Re-Evaluation 09/14/16   Authorization Type Medicare, G-Code every 10th visit   PT Start Time 1015   PT Stop Time 1058   PT Time Calculation (min) 43 min   Activity Tolerance Patient tolerated treatment well   Behavior During Therapy [facility] for tasks assessed/performed      Past Medical History:  Diagnosis Date  . Fx humeral neck   . Skin melanoma ([place])     Past Surgical History:  Procedure Laterality Date  . TONSILLECTOMY      There were no vitals filed for this visit.      Subjective Assessment - 07/22/16 1016    Subjective MD pleased with progress so far; thinks she may be ready for trying a cane.     Patient is accompained by: Family member  daughter in law   Pertinent History Had CVA last month, completed inpiatient rehab last Friday   Limitations Standing;Walking;Other (comment);House hold activities   Patient Stated Goals To return to usual self, driving, gardening, mowing yard, laundry, etc   Currently in Pain? No/denies                         [facility] Adult PT Treatment/Exercise - 07/22/16 1019      Ambulation/Gait   Ambulation/Gait Yes   Ambulation/Gait Assistance 4: Min guard   Ambulation Distance (Feet) 300 Feet  150' x 2   Assistive device Straight cane   Gait Pattern Step-to pattern;Step-through pattern   Ambulation Surface Level;Indoor   Gait Comments gait training with SPC with cues initially for 3 pt pattern progressing to 2 pt pattern.  Pt without LOB and only out of sequence with turns.  Pt has cane at home; will  bring next session to assess for proper fit.     Knee/Hip Exercises: Aerobic   Other Aerobic Scifit level 3.5 bil LE's only x 8 minutes     Knee/Hip Exercises: Standing   Knee Flexion Both;10 reps   Knee Flexion Limitations alt; red theraband   Hip Abduction Both;10 reps;Knee straight   Abduction Limitations alt, red theraband   SLS intermittent UE support RLE multiple attempts 2-3 sec each   Other Standing Knee Exercises side stepping 10'x4 without UE support     Knee/Hip Exercises: Seated   Long Arc Quad Both;10 reps   Long Arc Quad Limitations red theraband   Marching Limitations x10 bil with red theraband   Sit to Sand 10 reps;without UE support                PT Education - 07/22/16 1108    Education provided Yes   Education Details bring cane from home to assess for fit next session; once pt has proper cane able to practice amb with cane at home with supervision   Person(s) Educated Patient;Child(ren)   Methods Explanation   Comprehension Verbalized understanding          PT Short Term Goals - 07/13/16 1757      PT SHORT TERM GOAL #1   Title Patient will  demonstrate decreased fall risk with Berg balance score 45/56 or greater.   Time 4   Period Weeks   Status New     PT SHORT TERM GOAL #2   Title Patient to complete TUG and set goal as appropriate for fall risk reduction.   Time 1   Period Weeks   Status New     PT SHORT TERM GOAL #3   Title Patient to verbalize CVA warning signs and risk factors with modifications for prevention.    Time 4   Period Weeks   Status New     PT SHORT TERM GOAL #4   Title Patient to ambulate indoor surfaces with cane and S over 300' no LOB.    Time 4   Period Weeks   Status New           PT Long Term Goals - 07/13/16 1759      PT LONG TERM GOAL #1   Title Patient to demonstrate decreased fall risk with Gait velocity >0.8 m/s   Time 8   Period Weeks   Status New     PT LONG TERM GOAL #2   Title Patient  to be independent with HEP for balance and strength and walking program.   Time 8   Period Weeks   Status New     PT LONG TERM GOAL #3   Title Patient to ambulate indoors without device independent 300' no LOB   Time 8   Period Weeks   Status New     PT LONG TERM GOAL #4   Title Patient to ambulate outdoors with cane and no LOB 500' paved and grassy surfaces.    Time 8   Period Weeks   Status New               Plan - 07/22/16 1109    Clinical Impression Statement Session today focused on review of HEP as pt with many questions on how to complete, which pt performed with min cues and answered all questions.  Initiated gait training with SPC today and pt demonstrated good carryover of sequencing and technique without LOB, and only having difficulty with turns.  Will bring cane from home next session to assess for proper fit and if pt continues to demonstrate safe technique will be ready to begin practicing at home with supervision.  Will continue to benefit from PT to maximize function and independence.   PT Treatment/Interventions ADLs/Self Care Home Management;Functional mobility training;Stair training;Gait training;DME Instruction;Therapeutic activities;Therapeutic exercise;Neuromuscular re-education;Patient/family education;Balance training;Vestibular   PT Next Visit Plan Continue RLE strengthening and gait and balance.; *needs TUG*   Consulted and Agree with Plan of Care Patient;Family member/caregiver      Patient will benefit from skilled therapeutic intervention in order to improve the following deficits and impairments:  Abnormal gait, Impaired UE functional use, Decreased safety awareness, Decreased endurance, Decreased activity tolerance, Decreased balance, Decreased mobility, Decreased strength, Decreased knowledge of use of DME, Pain, Decreased cognition  Visit Diagnosis: Other abnormalities of gait and mobility  Hemiplegia and hemiparesis following cerebral  infarction affecting right dominant side (HCC)  Unsteadiness on feet     Problem List Patient Active Problem List   Diagnosis Date Noted  . Gait disturbance, post-stroke 07/21/2016  . Ankle contracture, right 07/21/2016  . Post-operative pain   . Benign essential HTN   . Urinary retention   . Closed fracture of left proximal humerus   . Left pontine cerebrovascular accident ([place]) 06/26/2016  .  Stroke (cerebrum) ([place]) 06/23/2016  . Blood pressure elevated without history of HTN 06/23/2016  . Left pontine stroke ([place]) 06/23/2016  . Stroke-like symptoms   . Eye movement disorder   . Closed fracture of neck of humerus with routine healing       Laureen Abrahams, PT, DPT 07/22/16 11:18 AM    [facility] [address] [place], [place], [address] Phone: [phone]   Fax:  [phone]  Name: Melanie Hendricks MRN: [id number] Date of Birth: 07/20/1936

## 2016-07-22 NOTE — Patient Instructions (Signed)
Fine Motor Coordination Exercises  Perform the following exercises 2-3 times a day, for 10 min, as recommended by your occupational therapist.  Reynold Bowen of hand on table, spread fingers apart, then together. (10 times) Lift fingers and thumb off table one at a time. Increase speed as able. (10 times) Touch thumb to each fingertip. Increase speed as able. (10 times) Pick up 5 small objects (coins, marbles, paperclips, beads, etc.) one at a time and hold them in hand, then place them one by one onto the table. Pick up small objects and place them into a cup or container. Thread buttons or beads onto a string. Play card games. Practice shuffling and dealing cards. Flip cards over onto table one by one. Stack approximately [employer] (checkers, coins, etc.) onto table. Practice writing skills, dot to dot, puzzles, etc. With tweezers, pick up small objects and put into a small container. Try sorting beads or buttons.

## 2016-07-23 ENCOUNTER — Ambulatory Visit: Payer: Medicare Other | Admitting: Physical Therapy

## 2016-07-23 ENCOUNTER — Ambulatory Visit: Payer: Medicare Other | Admitting: Occupational Therapy

## 2016-07-23 ENCOUNTER — Encounter: Payer: Self-pay | Admitting: Occupational Therapy

## 2016-07-23 DIAGNOSIS — R278 Other lack of coordination: Secondary | ICD-10-CM

## 2016-07-23 DIAGNOSIS — M25612 Stiffness of left shoulder, not elsewhere classified: Secondary | ICD-10-CM

## 2016-07-23 DIAGNOSIS — R2681 Unsteadiness on feet: Secondary | ICD-10-CM

## 2016-07-23 DIAGNOSIS — R2689 Other abnormalities of gait and mobility: Secondary | ICD-10-CM | POA: Diagnosis not present

## 2016-07-23 DIAGNOSIS — M25512 Pain in left shoulder: Secondary | ICD-10-CM

## 2016-07-23 DIAGNOSIS — I69351 Hemiplegia and hemiparesis following cerebral infarction affecting right dominant side: Secondary | ICD-10-CM

## 2016-07-23 DIAGNOSIS — M6281 Muscle weakness (generalized): Secondary | ICD-10-CM

## 2016-07-23 NOTE — Therapy (Signed)
[facility] [address] [place], [place], [address] Phone: [phone]   Fax:  [phone]  Occupational Therapy Treatment  Patient Details  Name: Melanie Hendricks MRN: [id number] Date of Birth: 02/26/1936 Referring Provider: Dr. Alysia Penna  Encounter Date: 07/23/2016      OT End of Session - 07/23/16 1001    Visit Number 4   Number of Visits 17   Date for OT Re-Evaluation 09/11/16   Authorization Type [employer] Medicare, no visit limit/auth; needs G-code   Authorization - Visit Number 4   Authorization - Number of Visits 10   OT Start Time 0845   OT Stop Time 0930   OT Time Calculation (min) 45 min   Activity Tolerance Patient tolerated treatment well   Behavior During Therapy [facility] for tasks assessed/performed      Past Medical History:  Diagnosis Date  . Fx humeral neck   . Skin melanoma ([place])     Past Surgical History:  Procedure Laterality Date  . TONSILLECTOMY      There were no vitals filed for this visit.      Subjective Assessment - 07/23/16 0845    Subjective  "I don't have any pain as long as I'm not moving"   Patient is accompained by: Family member   Pertinent History hx of fall; L humerus fx 05/14/16 (s/p 8 wks at time of eval)--WBAT, ok for ROM (Dr. Tania Ade following); monitored for skin CA hx; hearing loss (particularly R side)   Limitations fall risk; L humerus fx 05/14/16 (s/p 8 wks at time of eval)--WBAT, ok for ROM (Dr. Tania Ade following), no driving, hearing loss   Patient Stated Goals return to being active   Currently in Pain? No/denies   Pain Score 0-No pain  Reaches 3/10 with shoulder movement                      OT Treatments/Exercises (OP) - 07/23/16 0915      ADLs   ADL Comments Pt reports I with bathing and dressing. Including FM - buttons, zippers etc. Pt reports that donning jacket takes increased time but she is independent.      Exercises   Exercises Shoulder     Shoulder Exercises: Supine   Other Supine Exercises Reviewed and performed: Supine on mat table holding ball for shoulder flexion with gravity assist; protraction and retraction w/ ball;  Shoulder flex with vertical cane, ER with cane, and IR with cane, table slides for shoulder flex and abduction; tabletop slides in all planes/directions. Ball on table for shoulder flexion. Pt performed with min cueing to avoid compensation patterns with head/trunk.     Shoulder Exercises: Seated   Other Seated Exercises See above as well as, ball on table for shoulder flexion in various planes, slimulated reach and mid-level reaching with graded clothes pins  (red and yellow) to place on vertical surface using left UE.   Other Seated Exercises Issued yellow putty for gentle grip and finger extension (roll out putty) - x10-15 reps each bilateral hands. Consider adding pinches next 1-2 visits.     Fine Motor Coordination   Fine Motor Coordination --  Verbal review of FM/coordination ex's. Pt is Mod I              Balance Exercises - 07/23/16 0857      Balance Exercises: Standing   Cone Rotation Solid surface;Upper extremity assist 2;A/P;Limitations   Cone Rotation Limitations cone  taps, double taps and tipping/uprighting both with cane and without;needed min assist to balance           OT Education - 07/23/16 0959    Education provided Yes   Education Details Added putty (yellow), supine with ball for shoulder stretches, table top slides in all planes with towel and ball. Review HEP and coordination.   Person(s) Educated Patient;Child(ren)  Daughter in law - Jarrett Soho   Methods Explanation;Demonstration;Verbal cues   Comprehension Verbalized understanding          OT Short Term Goals - 07/13/16 1111      OT SHORT TERM GOAL #1   Title Pt will be independent with initial HEP--check STGs 08/11/16   Time 4   Period Weeks   Status New     OT SHORT TERM  GOAL #2   Title Pt will improve coordination for ADLs as shown by improving time on 9-hole peg test by 5sec bilaterally.   Baseline R-32.87sec, L-42.41sec   Time 4   Period Weeks   Status New     OT SHORT TERM GOAL #3   Title Pt will demo at least 100* L shoulder flex for functional reaching/ADLs with 3/10 pain or less.   Baseline 90*   Time 4   Period Weeks   Status New     OT SHORT TERM GOAL #4   Title Pt will improve L grip strength by at least 5lbs to assist with opening containers/ADLs.   Baseline 15lbs   Time 4   Period Weeks   Status New     OT SHORT TERM GOAL #5   Title Pt will verbalize understanding of memory/cognitive compensation strategies for ADLs prn.   Baseline --   Time 4   Period Weeks   Status New     Additional Short Term Goals   Additional Short Term Goals Yes     OT SHORT TERM GOAL #6   Title Pt will perform simple snack prep/home maintenance task mod I.   Time 4   Period Weeks   Status New           OT Long Term Goals - 07/13/16 1117      OT LONG TERM GOAL #1   Title Pt will be independent with updated HEP.--check LTGs 09/11/16   Time 8   Period Weeks   Status New     OT LONG TERM GOAL #2   Title Pt will improve coordination for ADLs as shown by improving time on 9-hole peg test by 10sec with LUE.   Baseline 42.41sec   Time 8   Period Weeks   Status New     OT LONG TERM GOAL #3   Title Pt will demo at least 110* L shoulder flex for functional reaching/ADLs with less than 2/10 pain.   Baseline 90*   Time 8   Period Weeks   Status New     OT LONG TERM GOAL #4   Title Pt will improve L grip strength by at least 15lbs to assist with opening containers/ADLs.   Baseline 15lbs   Time 8   Period Weeks   Status New     OT LONG TERM GOAL #5   Title Pt will demo at least 100* L shoulder abduction for lateral reaching/ADLs with less than 3/10 pain.   Baseline 90*   Time 8   Period Weeks   Status New     Long Term Additional Goals    Additional  Long Term Goals Yes     OT LONG TERM GOAL #6   Title Pt will perform simple cooking tasks/meal prep mod I.   Time 8   Period Weeks   Status New     OT LONG TERM GOAL #7   Title Pt will use LUE as nondominant assist for ADLs/IADLs at least 85% of the time   Time 8   Period Weeks   Status New               Plan - 07/23/16 1001    Clinical Impression Statement Pt should experience increased AROM and flexibility bilateral UE's with upgraded HEP today & addition of yellow putty for gentle grip strengthening and finger extension/stretch bilaterally.   Rehab Potential Good   OT Frequency 2x / week   OT Duration 8 weeks   OT Treatment/Interventions Self-care/ADL training;Cryotherapy;Parrafin;Therapeutic exercise;DME and/or AE instruction;Therapist, nutritional;Therapeutic activities;Patient/family education;Balance training;Cognitive remediation/compensation;Splinting;Manual Therapy;Neuromuscular education;Fluidtherapy;Ultrasound;Electrical Stimulation;Moist Heat;Energy conservation;Passive range of motion;Therapeutic exercises   Plan Functional reaching LUE, review putty ex's bilateral hands (?add pinches), coordination RUE.   Consulted and Agree with Plan of Care Patient;Family member/caregiver   Family Member Consulted dtr-in-law Jarrett Soho)      Patient will benefit from skilled therapeutic intervention in order to improve the following deficits and impairments:  Decreased coordination, Decreased knowledge of use of DME, Decreased strength, Impaired UE functional use, Decreased activity tolerance, Decreased range of motion, Pain, Decreased cognition, Decreased balance, Decreased mobility  Visit Diagnosis: Muscle weakness (generalized)  Other lack of coordination  Acute pain of left shoulder  Hemiplegia and hemiparesis following cerebral infarction affecting right dominant side (HCC)  Stiffness of left shoulder, not elsewhere classified    Problem  List Patient Active Problem List   Diagnosis Date Noted  . Gait disturbance, post-stroke 07/21/2016  . Ankle contracture, right 07/21/2016  . Post-operative pain   . Benign essential HTN   . Urinary retention   . Closed fracture of left proximal humerus   . Left pontine cerebrovascular accident ([place]) 06/26/2016  . Stroke (cerebrum) ([place]) 06/23/2016  . Blood pressure elevated without history of HTN 06/23/2016  . Left pontine stroke ([place]) 06/23/2016  . Stroke-like symptoms   . Eye movement disorder   . Closed fracture of neck of humerus with routine healing     [place], Amy Ardath Sax , OTR/L 07/23/2016, 10:06 AM  [facility] [address] [place], [place], [address] Phone: [phone]   Fax:  [phone]  Name: TAYVA MARICH MRN: [id number] Date of Birth: [birth year]

## 2016-07-23 NOTE — Therapy (Addendum)
[facility] [address] [place], [place], [address] Phone: [phone]   Fax:  [phone]  Physical Therapy Treatment  Patient Details  Name: Melanie Hendricks MRN: [id number] Date of Birth: 15-Jun-1936 Referring Provider: Ella Bodo  Encounter Date: 07/23/2016      PT End of Session - 07/23/16 0855    Visit Number 4   Number of Visits 16   Date for PT Re-Evaluation 09/14/16   Authorization Type Medicare, G-Code every 10th visit   PT Start Time 0805   PT Stop Time 0845   PT Time Calculation (min) 40 min   Equipment Utilized During Treatment Gait belt   Activity Tolerance Patient tolerated treatment well   Behavior During Therapy [facility] for tasks assessed/performed      Past Medical History:  Diagnosis Date  . Fx humeral neck   . Skin melanoma ([place])     Past Surgical History:  Procedure Laterality Date  . TONSILLECTOMY      There were no vitals filed for this visit.      Subjective Assessment - 07/23/16 0816    Subjective Pt brought her cane in from home.  Denies falls or changes.   Patient is accompained by: Family member  daughter in law   Pertinent History Had CVA last month, completed inpiatient rehab last Friday   Limitations Standing;Walking;Other (comment);House hold activities   Patient Stated Goals To return to usual self, driving, gardening, mowing yard, laundry, etc   Currently in Pain? Yes   Pain Score 0-No pain  goes to a 3 when reaches end range of shoulder movement   Pain Location Shoulder   Pain Orientation Left              [facility] Adult PT Treatment/Exercise - 07/23/16 0001      Transfers   Transfers Sit to Stand;Stand Pivot Transfers   Sit to Stand 6: Modified independent (Device/Increase time);With upper extremity assist;Without upper extremity assist   Stand Pivot Transfers 6: Modified independent (Device/Increase time);With armrests   Comments Performed sit<>stand x 10  reps with LLE on 4" step and 10 reps with LLE on bubble ball-strengthening to encourage RLE weight bearing     Ambulation/Gait   Ambulation/Gait Yes   Ambulation/Gait Assistance 4: Min guard   Ambulation Distance (Feet) 330 Feet  110   Assistive device Straight cane;Rolling walker   Gait Pattern Step-to pattern;Step-through pattern   Ambulation Surface Level;Indoor   Gait Comments gait training with SPC using 2 point pattern and cues for coordination     Standardized Balance Assessment   Standardized Balance Assessment Timed Up and Go Test     Timed Up and Go Test   TUG Normal TUG   Normal TUG (seconds) 16.87             Balance Exercises - 07/23/16 0857      Balance Exercises: Standing   Cone Rotation Solid surface;Upper extremity assist 2;A/P;Limitations   Cone Rotation Limitations cone taps, double taps and tipping/uprighting both with cane and without;needed min assist to balance           PT Education - 07/23/16 0854    Education provided Yes   Education Details Ambulation at home with cane and supervision, using RW outside and at night and with urgent mobility needs (bathroom trips, etc...)   Person(s) Educated Patient;Other (comment)  daughter in law   Methods Explanation   Comprehension Verbalized understanding  PT Short Term Goals - 07/13/16 1757      PT SHORT TERM GOAL #1   Title Patient will demonstrate decreased fall risk with Berg balance score 45/56 or greater.   Time 4   Period Weeks   Status New     PT SHORT TERM GOAL #2   Title Patient to complete TUG and set goal as appropriate for fall risk reduction.   Time 1   Period Weeks   Status New     PT SHORT TERM GOAL #3   Title Patient to verbalize CVA warning signs and risk factors with modifications for prevention.    Time 4   Period Weeks   Status New     PT SHORT TERM GOAL #4   Title Patient to ambulate indoor surfaces with cane and S over 300' no LOB.    Time 4   Period  Weeks   Status New           PT Long Term Goals - 07/13/16 1759      PT LONG TERM GOAL #1   Title Patient to demonstrate decreased fall risk with Gait velocity >0.8 m/s   Time 8   Period Weeks   Status New     PT LONG TERM GOAL #2   Title Patient to be independent with HEP for balance and strength and walking program.   Time 8   Period Weeks   Status New     PT LONG TERM GOAL #3   Title Patient to ambulate indoors without device independent 300' no LOB   Time 8   Period Weeks   Status New     PT LONG TERM GOAL #4   Title Patient to ambulate outdoors with cane and no LOB 500' paved and grassy surfaces.    Time 8   Period Weeks   Status New               Plan - 07/23/16 0856    Clinical Impression Statement Session focused on gait and balance.  No LOB with cane but did get out of sequence.  Able to correct sequence.  Continue PT per POC.   Rehab Potential Good   PT Frequency 2x / week   PT Duration 8 weeks   PT Treatment/Interventions ADLs/Self Care Home Management;Functional mobility training;Stair training;Gait training;DME Instruction;Therapeutic activities;Therapeutic exercise;Neuromuscular re-education;Patient/family education;Balance training;Vestibular   PT Next Visit Plan Continue balance, gait and RLE strengthening   Consulted and Agree with Plan of Care Patient;Family member/caregiver   Family Member Consulted daughter in law      Patient will benefit from skilled therapeutic intervention in order to improve the following deficits and impairments:  Abnormal gait, Impaired UE functional use, Decreased safety awareness, Decreased endurance, Decreased activity tolerance, Decreased balance, Decreased mobility, Decreased strength, Decreased knowledge of use of DME, Pain, Decreased cognition  Visit Diagnosis: Other abnormalities of gait and mobility  Hemiplegia and hemiparesis following cerebral infarction affecting right dominant side  (HCC)  Unsteadiness on feet      Problem List Patient Active Problem List   Diagnosis Date Noted  . Gait disturbance, post-stroke 07/21/2016  . Ankle contracture, right 07/21/2016  . Post-operative pain   . Benign essential HTN   . Urinary retention   . Closed fracture of left proximal humerus   . Left pontine cerebrovascular accident ([place]) 06/26/2016  . Stroke (cerebrum) ([place]) 06/23/2016  . Blood pressure elevated without history of HTN 06/23/2016  . Left pontine stroke ([place])  06/23/2016  . Stroke-like symptoms   . Eye movement disorder   . Closed fracture of neck of humerus with routine healing     Narda Bonds 07/23/2016, 9:02 AM  [facility] [address] [place], [place], [address] Phone: [phone]   Fax:  [phone]  Name: Melanie Hendricks MRN: [id number] Date of Birth: 12/06/1935   Narda Bonds, [place] 07/23/16 9:02 AM Phone: [phone] Fax: [phone]

## 2016-07-28 ENCOUNTER — Ambulatory Visit: Payer: Medicare Other | Admitting: Physical Therapy

## 2016-07-28 ENCOUNTER — Ambulatory Visit: Payer: Medicare Other | Admitting: Occupational Therapy

## 2016-07-28 DIAGNOSIS — R269 Unspecified abnormalities of gait and mobility: Secondary | ICD-10-CM

## 2016-07-28 DIAGNOSIS — M25512 Pain in left shoulder: Secondary | ICD-10-CM

## 2016-07-28 DIAGNOSIS — I69351 Hemiplegia and hemiparesis following cerebral infarction affecting right dominant side: Secondary | ICD-10-CM

## 2016-07-28 DIAGNOSIS — M6281 Muscle weakness (generalized): Secondary | ICD-10-CM

## 2016-07-28 DIAGNOSIS — M25612 Stiffness of left shoulder, not elsewhere classified: Secondary | ICD-10-CM

## 2016-07-28 DIAGNOSIS — R2689 Other abnormalities of gait and mobility: Secondary | ICD-10-CM | POA: Diagnosis not present

## 2016-07-28 DIAGNOSIS — R278 Other lack of coordination: Secondary | ICD-10-CM

## 2016-07-28 NOTE — Addendum Note (Signed)
Addended by: Reginia Naas R on: 07/28/2016 09:07 AM   Modules accepted: Orders

## 2016-07-28 NOTE — Therapy (Signed)
[facility] [address] [place], [place], [address] Phone: [phone]   Fax:  [phone]  Occupational Therapy Treatment  Patient Details  Name: Melanie Hendricks MRN: [id number] Date of Birth: Mar 06, 1936 Referring Provider: Dr. Alysia Penna  Encounter Date: 07/28/2016      OT End of Session - 07/28/16 0821    Visit Number 5   Number of Visits 17   Date for OT Re-Evaluation 09/11/16   Authorization Type [employer] Medicare, no visit limit/auth; needs G-code   Authorization - Visit Number 5   Authorization - Number of Visits 10   OT Start Time 0848   OT Stop Time 0932   OT Time Calculation (min) 44 min   Activity Tolerance Patient tolerated treatment well   Behavior During Therapy [facility] for tasks assessed/performed      Past Medical History:  Diagnosis Date  . Fx humeral neck   . Skin melanoma ([place])     Past Surgical History:  Procedure Laterality Date  . TONSILLECTOMY      There were no vitals filed for this visit.      Subjective Assessment - 07/28/16 [id number]    Subjective  "saw Dr. Tamera Punt of Friday and he thought my arm had healed well and he released me"   Patient is accompained by: Family member   Pertinent History hx of fall; L humerus fx 05/14/16 (s/p 8 wks at time of eval)--WBAT, ok for ROM (Dr. Tania Ade following); monitored for skin CA hx; hearing loss (particularly R side)   Limitations fall risk; L humerus fx 05/14/16 (s/p 8 wks at time of eval)--WBAT, ok for ROM (Dr. Tania Ade following), no driving, hearing loss   Patient Stated Goals return to being active   Currently in Pain? Yes   Pain Score --  0-3/10   Pain Location --  shoulder   Pain Orientation Left   Pain Descriptors / Indicators Aching;Sharp   Pain Type Acute pain   Pain Onset More than a month ago   Pain Frequency Intermittent   Aggravating Factors  movement   Pain Relieving Factors rest       Shoulder flex,  horizontal abduction/adduction and ER table slides with good performance.  Then, in standing shoulder flex, horizontal abduction/adduction table slides with wt. Bearing for gentle resistance.  Cane ex for IR and towel stretch for IR with min cueing.  Arm bike x90min level 1 for conditioning/ROM (forward/backward) without rest.   In standing, functional reaching at mid-level for incr activity tolerance and coordination (with BUEs) to place small pegs in vertical pegboard with min soreness LUE and min difficulty occasionally.  Reviewed and updated putty HEP--see pt instructions (added pinches)                           OT Education - 07/28/16 0930    Education Details Yellow putty HEP (grip strength and pinch)   Person(s) Educated Patient   Methods Explanation;Demonstration;Verbal cues;Handout   Comprehension Verbalized understanding;Returned demonstration  min v.c.          OT Short Term Goals - 07/13/16 1111      OT SHORT TERM GOAL #1   Title Pt will be independent with initial HEP--check STGs 08/11/16   Time 4   Period Weeks   Status New     OT SHORT TERM GOAL #2   Title Pt will improve coordination for ADLs as shown by improving time  on 9-hole peg test by 5sec bilaterally.   Baseline R-32.87sec, L-42.41sec   Time 4   Period Weeks   Status New     OT SHORT TERM GOAL #3   Title Pt will demo at least 100* L shoulder flex for functional reaching/ADLs with 3/10 pain or less.   Baseline 90*   Time 4   Period Weeks   Status New     OT SHORT TERM GOAL #4   Title Pt will improve L grip strength by at least 5lbs to assist with opening containers/ADLs.   Baseline 15lbs   Time 4   Period Weeks   Status New     OT SHORT TERM GOAL #5   Title Pt will verbalize understanding of memory/cognitive compensation strategies for ADLs prn.   Baseline --   Time 4   Period Weeks   Status New     Additional Short Term Goals   Additional Short Term Goals Yes      OT SHORT TERM GOAL #6   Title Pt will perform simple snack prep/home maintenance task mod I.   Time 4   Period Weeks   Status New           OT Long Term Goals - 07/13/16 1117      OT LONG TERM GOAL #1   Title Pt will be independent with updated HEP.--check LTGs 09/11/16   Time 8   Period Weeks   Status New     OT LONG TERM GOAL #2   Title Pt will improve coordination for ADLs as shown by improving time on 9-hole peg test by 10sec with LUE.   Baseline 42.41sec   Time 8   Period Weeks   Status New     OT LONG TERM GOAL #3   Title Pt will demo at least 110* L shoulder flex for functional reaching/ADLs with less than 2/10 pain.   Baseline 90*   Time 8   Period Weeks   Status New     OT LONG TERM GOAL #4   Title Pt will improve L grip strength by at least 15lbs to assist with opening containers/ADLs.   Baseline 15lbs   Time 8   Period Weeks   Status New     OT LONG TERM GOAL #5   Title Pt will demo at least 100* L shoulder abduction for lateral reaching/ADLs with less than 3/10 pain.   Baseline 90*   Time 8   Period Weeks   Status New     Long Term Additional Goals   Additional Long Term Goals Yes     OT LONG TERM GOAL #6   Title Pt will perform simple cooking tasks/meal prep mod I.   Time 8   Period Weeks   Status New     OT LONG TERM GOAL #7   Title Pt will use LUE as nondominant assist for ADLs/IADLs at least 85% of the time   Time 8   Period Weeks   Status New               Plan - 07/28/16 [id number]    Clinical Impression Statement Pt demo improved ROM today and reports decr pain in L shoulder and hand.  Pt is progresssing towards goals.   Rehab Potential Good   OT Frequency 2x / week   OT Duration 8 weeks   OT Treatment/Interventions Self-care/ADL training;Cryotherapy;Parrafin;Therapeutic exercise;DME and/or AE instruction;Therapist, nutritional;Therapeutic activities;Patient/family education;Balance training;Cognitive  remediation/compensation;Splinting;Manual Therapy;Neuromuscular education;Fluidtherapy;Ultrasound;Dealer  Stimulation;Moist Heat;Energy conservation;Passive range of motion;Therapeutic exercises   Plan low range yellow theraband?, continue with ROM and hand strength, forearm gym?   OT Home Exercise Plan Education provided:  Reviewed HEP for LUE ROM from hospital (pt to continue), coordination HEP    Consulted and Agree with Plan of Care Patient;Family member/caregiver   Family Member Consulted dtr-in-law Jarrett Soho)      Patient will benefit from skilled therapeutic intervention in order to improve the following deficits and impairments:  Decreased coordination, Decreased knowledge of use of DME, Decreased strength, Impaired UE functional use, Decreased activity tolerance, Decreased range of motion, Pain, Decreased cognition, Decreased balance, Decreased mobility  Visit Diagnosis: Stiffness of left shoulder, not elsewhere classified  Acute pain of left shoulder  Muscle weakness (generalized)  Other lack of coordination  Hemiplegia and hemiparesis following cerebral infarction affecting right dominant side [facility])    Problem List Patient Active Problem List   Diagnosis Date Noted  . Gait disturbance, post-stroke 07/21/2016  . Ankle contracture, right 07/21/2016  . Post-operative pain   . Benign essential HTN   . Urinary retention   . Closed fracture of left proximal humerus   . Left pontine cerebrovascular accident ([place]) 06/26/2016  . Stroke (cerebrum) ([place]) 06/23/2016  . Blood pressure elevated without history of HTN 06/23/2016  . Left pontine stroke ([place]) 06/23/2016  . Stroke-like symptoms   . Eye movement disorder   . Closed fracture of neck of humerus with routine healing     [facility] 07/28/2016, 9:32 AM  [facility] [address] [place], [place], [address] Phone: [phone]   Fax:   [phone]  Name: Melanie Hendricks MRN: [id number] Date of Birth: [birth year]   Vianne Bulls, OTR/L [facility] [address] [place], [place]  [address] [phone] phone [phone] 07/28/16 9:32 AM

## 2016-07-28 NOTE — Therapy (Signed)
[facility] [address] [place], [place], [address] Phone: [phone]   Fax:  [phone]  Physical Therapy Treatment  Patient Details  Name: Melanie Hendricks MRN: [id number] Date of Birth: 1936-05-15 Referring Provider: Ella Bodo  Encounter Date: 07/28/2016      PT End of Session - 07/28/16 0847    Visit Number 5   Number of Visits 16   Date for PT Re-Evaluation 09/14/16   Authorization Type Medicare, G-Code every 10th visit   PT Start Time 0804   PT Stop Time 0844   PT Time Calculation (min) 40 min   Activity Tolerance Patient tolerated treatment well   Behavior During Therapy [facility] for tasks assessed/performed      Past Medical History:  Diagnosis Date  . Fx humeral neck   . Skin melanoma ([place])     Past Surgical History:  Procedure Laterality Date  . TONSILLECTOMY      There were no vitals filed for this visit.      Subjective Assessment - 07/28/16 0816    Subjective Pt went to MD about her shoulder and he said everything looks good and cleared her.  Not feeling confident with cane.   Patient is accompained by: Family member  daughter in law   Pertinent History Had CVA last month, completed inpiatient rehab last Friday   Limitations Standing;Walking;Other (comment);House hold activities   Patient Stated Goals To return to usual self, driving, gardening, mowing yard, laundry, etc   Currently in Pain? No/denies        Supine for RLE-SLR x 15 reps x 2 sets with 2# weight, hip/knee flexion x 12 reps x 2 sets with 2# weight, SAQ x 15 x 2 sets with 3# weight   L sidelying for R hip abduction straight knee with 2# weight x 10 x 2 sets  Sitting for RLE LAQ with 5# weight x 15 x 2 sets, hip flexion with 5# weight x 15 x 2 sets  Standing RLE hip flexion with 3# weight x 15 x 2 sets, R hamstring curl with 3# weight x 15 x 2 sets  Pull ups onto/off step with bil UE and RLE x 15 reps        PT  Short Term Goals - 07/13/16 1757      PT SHORT TERM GOAL #1   Title Patient will demonstrate decreased fall risk with Berg balance score 45/56 or greater.   Time 4   Period Weeks   Status New     PT SHORT TERM GOAL #2   Title Patient to complete TUG and set goal as appropriate for fall risk reduction.   Time 1   Period Weeks   Status New     PT SHORT TERM GOAL #3   Title Patient to verbalize CVA warning signs and risk factors with modifications for prevention.    Time 4   Period Weeks   Status New     PT SHORT TERM GOAL #4   Title Patient to ambulate indoor surfaces with cane and S over 300' no LOB.    Time 4   Period Weeks   Status New           PT Long Term Goals - 07/13/16 1759      PT LONG TERM GOAL #1   Title Patient to demonstrate decreased fall risk with Gait velocity >0.8 m/s   Time 8   Period Weeks   Status New  PT LONG TERM GOAL #2   Title Patient to be independent with HEP for balance and strength and walking program.   Time 8   Period Weeks   Status New     PT LONG TERM GOAL #3   Title Patient to ambulate indoors without device independent 300' no LOB   Time 8   Period Weeks   Status New     PT LONG TERM GOAL #4   Title Patient to ambulate outdoors with cane and no LOB 500' paved and grassy surfaces.    Time 8   Period Weeks   Status New               Plan - 07/28/16 1025    Clinical Impression Statement Pt tolerates strengthening exercises well.  Still not confident with balance and gait with cane.  Continue with PT per POC.   Rehab Potential Good   PT Frequency 2x / week   PT Duration 8 weeks   PT Treatment/Interventions ADLs/Self Care Home Management;Functional mobility training;Stair training;Gait training;DME Instruction;Therapeutic activities;Therapeutic exercise;Neuromuscular re-education;Patient/family education;Balance training;Vestibular   PT Next Visit Plan balance, gait, R LE strength   Consulted and Agree with Plan  of Care Patient;Family member/caregiver   Family Member Consulted daughter in law      Patient will benefit from skilled therapeutic intervention in order to improve the following deficits and impairments:  Abnormal gait, Impaired UE functional use, Decreased safety awareness, Decreased endurance, Decreased activity tolerance, Decreased balance, Decreased mobility, Decreased strength, Decreased knowledge of use of DME, Pain, Decreased cognition  Visit Diagnosis: Other abnormalities of gait and mobility  Muscle weakness (generalized)  Abnormality of gait     Problem List Patient Active Problem List   Diagnosis Date Noted  . Gait disturbance, post-stroke 07/21/2016  . Ankle contracture, right 07/21/2016  . Post-operative pain   . Benign essential HTN   . Urinary retention   . Closed fracture of left proximal humerus   . Left pontine cerebrovascular accident ([place]) 06/26/2016  . Stroke (cerebrum) ([place]) 06/23/2016  . Blood pressure elevated without history of HTN 06/23/2016  . Left pontine stroke ([place]) 06/23/2016  . Stroke-like symptoms   . Eye movement disorder   . Closed fracture of neck of humerus with routine healing     Narda Bonds 07/28/2016, 10:29 AM  [facility] [address] [place], [place], [address] Phone: [phone]   Fax:  [phone]  Name: Melanie Hendricks MRN: [id number] Date of Birth: [birth year]

## 2016-07-28 NOTE — Patient Instructions (Signed)
1. Grip Strengthening (Resistive Putty)   Squeeze putty using thumb and all fingers. Repeat 10 times with each hand. Do 2 sessions per day.   Extension (Assistive Putty)   Roll putty back and forth, being sure to use all fingertips. Repeat 2 times. Do 2 sessions per day with each hand.  Then pinch as below.   Palmar Pinch Strengthening (Resistive Putty)   Pinch putty between thumb and each fingertip in turn after rolling out

## 2016-08-03 ENCOUNTER — Ambulatory Visit (INDEPENDENT_AMBULATORY_CARE_PROVIDER_SITE_OTHER): Payer: Medicare Other | Admitting: Family

## 2016-08-03 ENCOUNTER — Encounter: Payer: Self-pay | Admitting: Family

## 2016-08-03 VITALS — BP 136/72 | HR 100 | Temp 97.7°F | Resp 16 | Ht 66.0 in | Wt 201.0 lb

## 2016-08-03 DIAGNOSIS — S42212D Unspecified displaced fracture of surgical neck of left humerus, subsequent encounter for fracture with routine healing: Secondary | ICD-10-CM

## 2016-08-03 DIAGNOSIS — I6350 Cerebral infarction due to unspecified occlusion or stenosis of unspecified cerebral artery: Secondary | ICD-10-CM | POA: Diagnosis not present

## 2016-08-03 DIAGNOSIS — I10 Essential (primary) hypertension: Secondary | ICD-10-CM

## 2016-08-03 DIAGNOSIS — I639 Cerebral infarction, unspecified: Secondary | ICD-10-CM

## 2016-08-03 NOTE — Progress Notes (Signed)
Subjective:    Patient ID: Melanie Hendricks, female    DOB: 1935-11-23, 80 y.o.   MRN: [id number]  Chief Complaint  Patient presents with  . Establish Care    hopsital follow up, cough that has been going on for years since 1990s has an appointment with ENT in a couple of days could this be addressed with them    HPI:  Melanie Hendricks is a 80 y.o. female who  has a past medical history of Allergy; Fx humeral neck; Hepatitis; Hyperlipidemia; Hypertension; Skin melanoma ([place]); and Stroke ([place]). and presents today for an office visit to establish care.   Pontine Stroke - Recently evaluated in the emergency department and admitted to the hospital with difficulty speaking, slurred speech, and numbness and tingling. TPA was not administered secondary to being be on the timeframe for treatment consideration. MRI of the head showed acute ischemic nonhemorrhagic left paramedian pontine infarct. 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 Carotid Dopplers with no obvious evidence of hemodynamically significant internal carotid artery stenosis. 2-D echocardiogram showed 55-60% ejection fraction with no wall motion abnormalities and grade 1 diastolic dysfunction. She was started on Plavix and aspirin. She was discharged to inpatient rehabilitation with the goals of ongoing aggressive stroke risk factor management. All hospital records, imaging, and labs were reviewed in detail.  Since leaving the hospital and rehabilitation she reports things have been progressing slowly. Does have to take her time with activities. Able to complete her activities of daily living with time. Uses a shower bench and currently lives with family. Continues to work with Physical Medicine and Rehabilitation. Continues to experience pain located in her recently fractured upper left arm and weakness along her right leg. There is some dexterity issues with her  left hand. She is right hand dominant. Reports taking the medications as prescribed and denies adverse side effects.   No Known Allergies    Outpatient Medications Prior to Visit  Medication Sig Dispense Refill  . acetaminophen (TYLENOL) 325 MG tablet Take 500 mg by mouth every 6 (six) hours as needed for mild pain or headache.     Marland Kitchen aspirin EC 81 MG EC tablet Take 1 tablet (81 mg total) by mouth daily.    Marland Kitchen atorvastatin (LIPITOR) 40 MG tablet Take 1 tablet (40 mg total) by mouth daily at 6 PM. 30 tablet 1  . clopidogrel (PLAVIX) 75 MG tablet Take 1 tablet (75 mg total) by mouth daily. 30 tablet 1  . docusate sodium (COLACE) 100 MG capsule Take 1 capsule (100 mg total) by mouth 2 (two) times daily. 10 capsule 0  . lisinopril (PRINIVIL,ZESTRIL) 5 MG tablet Take 1 tablet (5 mg total) by mouth daily. 30 tablet 1  . traMADol (ULTRAM) 50 MG tablet Take 1 tablet (50 mg total) by mouth every 6 (six) hours as needed for moderate pain. 30 tablet 0   No facility-administered medications prior to visit.      Past Medical History:  Diagnosis Date  . Allergy   . Fx humeral neck   . Hepatitis   . Hyperlipidemia   . Hypertension   . Skin melanoma ([place])   . Stroke [facility])       Past Surgical History:  Procedure Laterality Date  . Melenoma removal    . TONSILLECTOMY        Family History  Problem Relation Age of Onset  . Stomach cancer Mother   .  Thyroid cancer Father   . Heart attack Father   . Seizures Brother   . Heart failure Brother   . Hypertension Maternal Grandmother       Social History   Social History  . Marital status: Legally Separated    Spouse name: N/A  . Number of children: 3  . Years of education: 23   Occupational History  . Retired    Social History Main Topics  . Smoking status: Never Smoker  . Smokeless tobacco: Never Used  . Alcohol use No  . Drug use: No  . Sexual activity: Not on file   Other Topics Concern  . Not on file   Social  History Narrative   Fun: Gardening     Review of Systems  Constitutional: Negative for chills and fever.  Eyes:       Negative for changes in vision  Respiratory: Negative for cough, chest tightness and wheezing.   Cardiovascular: Negative for chest pain, palpitations and leg swelling.  Neurological: Positive for weakness. Negative for dizziness and light-headedness.       Objective:    BP 136/72 (BP Location: Left Arm, Patient Position: Sitting, Cuff Size: Large)   Pulse 100   Temp 97.7 F (36.5 C) (Oral)   Resp 16   Ht 5\' 6"  (1.676 m)   Wt 201 lb (91.2 kg)   SpO2 97%   BMI 32.44 kg/m  Nursing note and vital signs reviewed.  Physical Exam  Constitutional: She is oriented to person, place, and time. She appears well-developed and well-nourished.  Non-toxic appearance. She does not have a sickly appearance. She does not appear ill. No distress.  Eyes: Conjunctivae and EOM are normal. Pupils are equal, round, and reactive to light.  Cardiovascular: Normal rate, regular rhythm, normal heart sounds and intact distal pulses.   Pulmonary/Chest: Effort normal and breath sounds normal.  Musculoskeletal:  There is mild right upper and lower extremity weakness compared to the contralateral side. Sensation appears intact and appropriate.   Neurological: She is alert and oriented to person, place, and time. No cranial nerve deficit.  Skin: Skin is warm and dry.  Psychiatric: She has a normal mood and affect. Her behavior is normal. Judgment and thought content normal.        Assessment & Plan:   Problem List Items Addressed This Visit      Cardiovascular and Mediastinum   Left pontine stroke ([place]) - Primary    Left pontine stroke appears to improving well and continues to work with rehabilitation with the goal of decreasing risk factors for future stroke and optimizing current recovery. Anticoagulated with aspirin and Plavix. Blood pressure appears with adequate control. Follow  up with neurology as scheduled and continue to monitor.       Benign essential HTN    Hypertension appears adequately controlled below goal 140/90 with current regimen and no adverse side effects. Continue current dosage of lisinopril. Encouraged to monitor blood pressure at home and follow sodium diet.        Musculoskeletal and Integument   Closed fracture of neck of humerus with routine healing    Continues to make progress following closed fracture of neck of humerus and managed by orthopedics. Pain currently managed with Tramadol as needed. Continue to monitor and follow up with orthopedics.           I am having Melanie Hendricks maintain her acetaminophen, aspirin, atorvastatin, clopidogrel, docusate sodium, lisinopril, and traMADol.   Follow-up: Return if  symptoms worsen or fail to improve.  Mauricio Po, FNP

## 2016-08-03 NOTE — Assessment & Plan Note (Signed)
Continues to make progress following closed fracture of neck of humerus and managed by orthopedics. Pain currently managed with Tramadol as needed. Continue to monitor and follow up with orthopedics.

## 2016-08-03 NOTE — Assessment & Plan Note (Signed)
Hypertension appears adequately controlled below goal 140/90 with current regimen and no adverse side effects. Continue current dosage of lisinopril. Encouraged to monitor blood pressure at home and follow sodium diet.

## 2016-08-03 NOTE — Patient Instructions (Signed)
Thank you for choosing [facility].  SUMMARY AND INSTRUCTIONS:  Please schedule a time for your annual wellness exam.  Continue to follow up with Neurology and Physical Medicine/Rehabiliation.   Medication:  Continue to take your medications as prescribed.  Follow up:  If your symptoms worsen or fail to improve, please contact our office for further instruction, or in case of emergency go directly to the emergency room at the closest medical facility.

## 2016-08-03 NOTE — Assessment & Plan Note (Signed)
Left pontine stroke appears to improving well and continues to work with rehabilitation with the goal of decreasing risk factors for future stroke and optimizing current recovery. Anticoagulated with aspirin and Plavix. Blood pressure appears with adequate control. Follow up with neurology as scheduled and continue to monitor.

## 2016-08-04 ENCOUNTER — Other Ambulatory Visit: Payer: Self-pay | Admitting: Physical Medicine & Rehabilitation

## 2016-08-05 ENCOUNTER — Encounter: Payer: Self-pay | Admitting: *Deleted

## 2016-08-05 ENCOUNTER — Ambulatory Visit: Payer: Medicare Other | Admitting: *Deleted

## 2016-08-05 VITALS — BP 152/79 | HR 98

## 2016-08-05 DIAGNOSIS — I69351 Hemiplegia and hemiparesis following cerebral infarction affecting right dominant side: Secondary | ICD-10-CM

## 2016-08-05 DIAGNOSIS — M6281 Muscle weakness (generalized): Secondary | ICD-10-CM

## 2016-08-05 DIAGNOSIS — M25512 Pain in left shoulder: Secondary | ICD-10-CM

## 2016-08-05 DIAGNOSIS — R2689 Other abnormalities of gait and mobility: Secondary | ICD-10-CM

## 2016-08-05 DIAGNOSIS — R278 Other lack of coordination: Secondary | ICD-10-CM

## 2016-08-05 DIAGNOSIS — M25612 Stiffness of left shoulder, not elsewhere classified: Secondary | ICD-10-CM

## 2016-08-05 NOTE — Therapy (Signed)
[facility] [address] [place], [place], [address] Phone: [phone]   Fax:  [phone]  Physical Therapy Treatment  Patient Details  Name: Melanie Hendricks MRN: [id number] Date of Birth: January 02, 1936 Referring Provider: Ella Bodo  Encounter Date: 08/05/2016      PT End of Session - 08/05/16 1322    Visit Number 6   Number of Visits 16   Date for PT Re-Evaluation 09/14/16   PT Start Time 0845   PT Stop Time 0928   PT Time Calculation (min) 43 min   Equipment Utilized During Treatment Gait belt   Activity Tolerance Patient tolerated treatment well   Behavior During Therapy [facility] for tasks assessed/performed      Past Medical History:  Diagnosis Date  . Allergy   . Fx humeral neck   . Hepatitis   . Hyperlipidemia   . Hypertension   . Skin melanoma ([place])   . Stroke [facility])     Past Surgical History:  Procedure Laterality Date  . Melenoma removal    . TONSILLECTOMY      Vitals:   08/05/16 0857  BP: (!) 152/79  Pulse: 98  SpO2: 95%        Subjective Assessment - 08/05/16 0845    Subjective Exercises going well.  Feel like getting stronger.  Watched some videos on walking with a cane and gave me more confidence.  Using it about 5 minutes   Patient is accompained by: Family member   Currently in Pain? No/denies                         [facility] Adult PT Treatment/Exercise - 08/05/16 0001      Transfers   Transfers Sit to Stand   Sit to Stand 7: Independent;Without upper extremity assist   Number of Reps 10 reps     Ambulation/Gait   Ambulation/Gait Yes   Ambulation/Gait Assistance 5: Supervision;4: Min guard   Ambulation/Gait Assistance Details cues for wide BOS, forward gaze   Ambulation Distance (Feet) 475 Feet  and 200' no device min A   Assistive device Straight cane;None   Gait Pattern Step-through pattern;Decreased stride length;Trendelenburg   Ambulation Surface  Level;Indoor     High Level Balance   High Level Balance Comments static balance feet apart head turns, eyes closed 30 sec, feet together eyes open 30 sec; standing step taps to 6" step with cane for support, step taps to cones with cane and min A     Knee/Hip Exercises: Aerobic   Other Aerobic Scifit level 2.0 bil UE/ LE's x 6 minutes     Knee/Hip Exercises: Standing   Lateral Step Up Right;10 reps;Hand Hold: 2;Step Height: 6"   Forward Step Up Right;Hand Hold: 2;Step Height: 6";10 reps                  PT Short Term Goals - 07/13/16 1757      PT SHORT TERM GOAL #1   Title Patient will demonstrate decreased fall risk with Berg balance score 45/56 or greater.   Time 4   Period Weeks   Status New     PT SHORT TERM GOAL #2   Title Patient to complete TUG and set goal as appropriate for fall risk reduction.   Time 1   Period Weeks   Status New     PT SHORT TERM GOAL #3   Title Patient to verbalize CVA warning signs and  risk factors with modifications for prevention.    Time 4   Period Weeks   Status New     PT SHORT TERM GOAL #4   Title Patient to ambulate indoor surfaces with cane and S over 300' no LOB.    Time 4   Period Weeks   Status New           PT Long Term Goals - 07/13/16 1759      PT LONG TERM GOAL #1   Title Patient to demonstrate decreased fall risk with Gait velocity >0.8 m/s   Time 8   Period Weeks   Status New     PT LONG TERM GOAL #2   Title Patient to be independent with HEP for balance and strength and walking program.   Time 8   Period Weeks   Status New     PT LONG TERM GOAL #3   Title Patient to ambulate indoors without device independent 300' no LOB   Time 8   Period Weeks   Status New     PT LONG TERM GOAL #4   Title Patient to ambulate outdoors with cane and no LOB 500' paved and grassy surfaces.    Time 8   Period Weeks   Status New               Plan - 08/05/16 1322    Clinical Impression Statement  Patient able to walk without cane min A reports feels a little better due to not worrying about L arm, but still needs device or assist due to risk for R foot catching floor when fatigued or R LE buckling.  Feel continued skilled PT needed to address balance, gait and strength issues.    Rehab Potential Good   PT Frequency 2x / week   PT Duration 8 weeks   PT Treatment/Interventions ADLs/Self Care Home Management;Functional mobility training;Stair training;Gait training;DME Instruction;Therapeutic activities;Therapeutic exercise;Neuromuscular re-education;Patient/family education;Balance training;Vestibular   PT Next Visit Plan Gait with cane, attempt unlevel surface, dynamic balance, endurance   Consulted and Agree with Plan of Care Patient;Family member/caregiver   Family Member Consulted daughter in law      Patient will benefit from skilled therapeutic intervention in order to improve the following deficits and impairments:  Abnormal gait, Impaired UE functional use, Decreased safety awareness, Decreased endurance, Decreased activity tolerance, Decreased balance, Decreased mobility, Decreased strength, Decreased knowledge of use of DME, Pain, Decreased cognition  Visit Diagnosis: Other abnormalities of gait and mobility  Hemiplegia and hemiparesis following cerebral infarction affecting right dominant side [facility])     Problem List Patient Active Problem List   Diagnosis Date Noted  . Gait disturbance, post-stroke 07/21/2016  . Ankle contracture, right 07/21/2016  . Post-operative pain   . Benign essential HTN   . Urinary retention   . Closed fracture of left proximal humerus   . Left pontine cerebrovascular accident ([place]) 06/26/2016  . Stroke (cerebrum) ([place]) 06/23/2016  . Blood pressure elevated without history of HTN 06/23/2016  . Left pontine stroke ([place]) 06/23/2016  . Stroke-like symptoms   . Eye movement disorder   . Closed fracture of neck of humerus with routine healing      Reginia Naas 08/05/2016, 1:27 PM  Magda Kiel, [place] 08/05/2016   [facility] [address] [place], [place], [address] Phone: [phone]   Fax:  [phone]  Name: Melanie Hendricks MRN: [id number] Date of Birth: 11/11/1935

## 2016-08-05 NOTE — Therapy (Signed)
[facility] [address] [place], [place], [address] Phone: [phone]   Fax:  [phone]  Occupational Therapy Treatment  Patient Details  Name: Melanie Hendricks MRN: [id number] Date of Birth: November 05, 1935 Referring Provider: Dr. Alysia Penna  Encounter Date: 08/05/2016      OT End of Session - 08/05/16 1058    Visit Number 6   Number of Visits 17   Date for OT Re-Evaluation 09/11/16   Authorization Type U[employer] Medicare, no visit limit/auth; needs G-code   Authorization - Visit Number 6   Authorization - Number of Visits 10   OT Start Time 0801   OT Stop Time 0844   OT Time Calculation (min) 43 min   Activity Tolerance Patient tolerated treatment well   Behavior During Therapy [facility] for tasks assessed/performed      Past Medical History:  Diagnosis Date  . Allergy   . Fx humeral neck   . Hepatitis   . Hyperlipidemia   . Hypertension   . Skin melanoma ([place])   . Stroke [facility])     Past Surgical History:  Procedure Laterality Date  . Melenoma removal    . TONSILLECTOMY      There were no vitals filed for this visit.      Subjective Assessment - 08/05/16 0805    Subjective  Pt denies any pain. Reports improvement with dexterity in right hand and strength and ROM in both UE's.   Patient is accompained by: Family member   Pertinent History hx of fall; L humerus fx 05/14/16 (s/p 8 wks at time of eval)--WBAT, ok for ROM (Dr. Tania Ade following); monitored for skin CA hx; hearing loss (particularly R side)   Limitations fall risk; L humerus fx 05/14/16 (s/p 8 wks at time of eval)--WBAT, ok for ROM (Dr. Tania Ade following), no driving, hearing loss   Patient Stated Goals return to being active   Currently in Pain? No/denies   Pain Score 0-No pain                      OT Treatments/Exercises (OP) - 08/05/16 0001      ADLs   ADL Comments Pt reports Mod I simple snack prep and  making coffee etc w/o difficulty usuing bilateral UE's. Pt also reports Mod I all bathing and dressing techniques. States that she has used cane in PT, but feels "More stready using the walker" at home especially during ADL's. Discussed tub transfers using tub bend and distant supervision at home - Pt/daughter in law expressed that pt is able to do this, verbally discussed safety and technique.     Exercises   Exercises Shoulder     Shoulder Exercises: Seated   Other Seated Exercises Seated cane exercises with focus on LUE ROM. Stress hold for count of 3-5 seconds. Min Vc's for positioning and follow through (x10 reps each ABD, shoulder bilateral flexion and LUE flexion; ER).   Other Seated Exercises Issued yellow theraband for RUE flexion; ABD and bilateral UE horizontal ABD. VC's and TC's required for proper positioning. Pt instructed to not perform w/ LUE at this time (except bilat horizonal ABD seated- extend left elbow) but ABD with RUE). Pt/daughter in law verbalized understanding and returned demonstration in clinic.     Fine Motor Coordination   Fine Motor Coordination --  Graded Clothes pins bilateral hands   Other Fine Motor Exercises Seated/placed resisted clothes pins on vertical surface while focusing on FM,  A/ROM and stretch of RUE (able to complete using all clothes pins/all colors. LUE with focus on shoulder flexion, AROM/Reach w/o compensatory patterns - able to complete with yellow, red, blue and green. Approximately 110* active shoulder flexion noted LUE.   Other Fine Motor Exercises Standing at countertop and reaching into cabinet to remove and stack cones with RUE, then repeating with LUE (maintaining functional reach and avoidance of compensatory patterns or shoulder hike). Pt performed each UE x2 sets of 12. Discussed reaching and removing light objects from cabinets at home ie: plastic cups, or containers or small box of cereal etc. for functional reaching activities.                 OT Education - 08/05/16 1057    Education provided Yes   Education Details Yellow therband for RUE low range strengthening; Functional reach bilateral UE's    Person(s) Educated Patient   Methods Explanation;Demonstration;Verbal cues;Handout   Comprehension Verbalized understanding;Returned demonstration;Verbal cues required          OT Short Term Goals - 07/13/16 1111      OT SHORT TERM GOAL #1   Title Pt will be independent with initial HEP--check STGs 08/11/16   Time 4   Period Weeks   Status New     OT SHORT TERM GOAL #2   Title Pt will improve coordination for ADLs as shown by improving time on 9-hole peg test by 5sec bilaterally.   Baseline R-32.87sec, L-42.41sec   Time 4   Period Weeks   Status New     OT SHORT TERM GOAL #3   Title Pt will demo at least 100* L shoulder flex for functional reaching/ADLs with 3/10 pain or less.   Baseline 90*   Time 4   Period Weeks   Status New     OT SHORT TERM GOAL #4   Title Pt will improve L grip strength by at least 5lbs to assist with opening containers/ADLs.   Baseline 15lbs   Time 4   Period Weeks   Status New     OT SHORT TERM GOAL #5   Title Pt will verbalize understanding of memory/cognitive compensation strategies for ADLs prn.   Baseline --   Time 4   Period Weeks   Status New     Additional Short Term Goals   Additional Short Term Goals Yes     OT SHORT TERM GOAL #6   Title Pt will perform simple snack prep/home maintenance task mod I.   Time 4   Period Weeks   Status New           OT Long Term Goals - 07/13/16 1117      OT LONG TERM GOAL #1   Title Pt will be independent with updated HEP.--check LTGs 09/11/16   Time 8   Period Weeks   Status New     OT LONG TERM GOAL #2   Title Pt will improve coordination for ADLs as shown by improving time on 9-hole peg test by 10sec with LUE.   Baseline 42.41sec   Time 8   Period Weeks   Status New     OT LONG TERM GOAL #3    Title Pt will demo at least 110* L shoulder flex for functional reaching/ADLs with less than 2/10 pain.   Baseline 90*   Time 8   Period Weeks   Status New     OT LONG TERM GOAL #4   Title Pt will  improve L grip strength by at least 15lbs to assist with opening containers/ADLs.   Baseline 15lbs   Time 8   Period Weeks   Status New     OT LONG TERM GOAL #5   Title Pt will demo at least 100* L shoulder abduction for lateral reaching/ADLs with less than 3/10 pain.   Baseline 90*   Time 8   Period Weeks   Status New     Long Term Additional Goals   Additional Long Term Goals Yes     OT LONG TERM GOAL #6   Title Pt will perform simple cooking tasks/meal prep mod I.   Time 8   Period Weeks   Status New     OT LONG TERM GOAL #7   Title Pt will use LUE as nondominant assist for ADLs/IADLs at least 85% of the time   Time 8   Period Weeks   Status New               Plan - 08/05/16 1059    Clinical Impression Statement Pt is progressing toward POC/goals and should benefit from functional reach for ADL's and simple snack prep. Low range yellow theraband added for RUE.   Rehab Potential Good   OT Frequency 2x / week   OT Duration 8 weeks   OT Treatment/Interventions Self-care/ADL training;Cryotherapy;Parrafin;Therapeutic exercise;DME and/or AE instruction;Therapist, nutritional;Therapeutic activities;Patient/family education;Balance training;Cognitive remediation/compensation;Splinting;Manual Therapy;Neuromuscular education;Fluidtherapy;Ultrasound;Electrical Stimulation;Moist Heat;Energy conservation;Passive range of motion;Therapeutic exercises   Plan Review low range yellow theraband ex's RUE. Functional reach; cont ROM/hand strength.   Consulted and Agree with Plan of Care Patient;Family member/caregiver   Family Member Consulted dtr-in-law Jarrett Soho)      Patient will benefit from skilled therapeutic intervention in order to improve the following deficits and  impairments:  Decreased coordination, Decreased knowledge of use of DME, Decreased strength, Impaired UE functional use, Decreased activity tolerance, Decreased range of motion, Pain, Decreased cognition, Decreased balance, Decreased mobility  Visit Diagnosis: Muscle weakness (generalized)  Other lack of coordination  Acute pain of left shoulder  Stiffness of left shoulder, not elsewhere classified  Hemiplegia and hemiparesis following cerebral infarction affecting right dominant side [facility])    Problem List Patient Active Problem List   Diagnosis Date Noted  . Gait disturbance, post-stroke 07/21/2016  . Ankle contracture, right 07/21/2016  . Post-operative pain   . Benign essential HTN   . Urinary retention   . Closed fracture of left proximal humerus   . Left pontine cerebrovascular accident ([place]) 06/26/2016  . Stroke (cerebrum) ([place]) 06/23/2016  . Blood pressure elevated without history of HTN 06/23/2016  . Left pontine stroke ([place]) 06/23/2016  . Stroke-like symptoms   . Eye movement disorder   . Closed fracture of neck of humerus with routine healing     Barnhill, Amy Ardath Sax, OTR/L 08/05/2016, 11:04 AM  [facility] [address] [place], [place], [address] Phone: [phone]   Fax:  [phone]  Name: Melanie Hendricks MRN: [id number] Date of Birth: 1936/02/05

## 2016-08-07 ENCOUNTER — Ambulatory Visit: Payer: Medicare Other | Attending: Physical Medicine & Rehabilitation | Admitting: Occupational Therapy

## 2016-08-07 ENCOUNTER — Encounter: Payer: Self-pay | Admitting: Physical Therapy

## 2016-08-07 ENCOUNTER — Ambulatory Visit: Payer: Medicare Other | Admitting: Physical Therapy

## 2016-08-07 DIAGNOSIS — R269 Unspecified abnormalities of gait and mobility: Secondary | ICD-10-CM | POA: Diagnosis present

## 2016-08-07 DIAGNOSIS — R2689 Other abnormalities of gait and mobility: Secondary | ICD-10-CM | POA: Diagnosis not present

## 2016-08-07 DIAGNOSIS — R2681 Unsteadiness on feet: Secondary | ICD-10-CM | POA: Insufficient documentation

## 2016-08-07 DIAGNOSIS — M6281 Muscle weakness (generalized): Secondary | ICD-10-CM | POA: Diagnosis present

## 2016-08-07 DIAGNOSIS — R278 Other lack of coordination: Secondary | ICD-10-CM | POA: Diagnosis present

## 2016-08-07 DIAGNOSIS — I69351 Hemiplegia and hemiparesis following cerebral infarction affecting right dominant side: Secondary | ICD-10-CM | POA: Diagnosis present

## 2016-08-07 DIAGNOSIS — M25512 Pain in left shoulder: Secondary | ICD-10-CM | POA: Diagnosis present

## 2016-08-07 DIAGNOSIS — I69318 Other symptoms and signs involving cognitive functions following cerebral infarction: Secondary | ICD-10-CM | POA: Diagnosis present

## 2016-08-07 DIAGNOSIS — M25612 Stiffness of left shoulder, not elsewhere classified: Secondary | ICD-10-CM

## 2016-08-07 NOTE — Therapy (Signed)
[facility] [address] [place], [place], [address] Phone: [phone]   Fax:  [phone]  Occupational Therapy Treatment  Patient Details  Name: Melanie Hendricks MRN: [id number] Date of Birth: 01-19-1936 Referring Provider: Dr. Alysia Penna  Encounter Date: 08/07/2016      OT End of Session - 08/07/16 1302    Visit Number 7   Number of Visits 17   Date for OT Re-Evaluation 09/11/16   Authorization Type UHC Medicare, no visit limit/auth; needs G-code   Authorization - Visit Number 7   Authorization - Number of Visits 10   OT Start Time 0805   OT Stop Time 0845   OT Time Calculation (min) 40 min   Activity Tolerance Patient tolerated treatment well   Behavior During Therapy [facility] for tasks assessed/performed      Past Medical History:  Diagnosis Date  . Allergy   . Fx humeral neck   . Hepatitis   . Hyperlipidemia   . Hypertension   . Skin melanoma ([place])   . Stroke [facility])     Past Surgical History:  Procedure Laterality Date  . Melenoma removal    . TONSILLECTOMY      There were no vitals filed for this visit.      Subjective Assessment - 08/07/16 0805    Subjective  Pt reports mild shoulder pain   Pertinent History hx of fall; L humerus fx 05/14/16 (s/p 8 wks at time of eval)--WBAT, ok for ROM (Dr. Tania Ade following); monitored for skin CA hx; hearing loss (particularly R side)   Limitations fall risk; L humerus fx 05/14/16 (s/p 8 wks at time of eval)--WBAT, ok for ROM (Dr. Tania Ade following), no driving, hearing loss   Patient Stated Goals return to being active   Currently in Pain? Yes   Pain Score 2    Pain Location Arm   Pain Orientation Left   Pain Descriptors / Indicators Aching   Pain Type Acute pain   Pain Onset More than a month ago   Pain Frequency Intermittent   Aggravating Factors  movement   Pain Relieving Factors rest          Treatment: Reviewed yellow  theraband exercises, pt was encouraged to put band in door for shoulder flexion, and to place under foot for abduction, pt demonstrated improved positioning/ performance following these recommendations and she returned demo. Therapist upgraded putty to red for bilateral composite grip and for tip pinch with right, pt to continue to use yellow for tip pinch on left. Pt returned demonstration. Arm bike x 6 mins level 1 for conditioning. Standing for functional reaching to copy peg design with bilateral UE's for increased fine motor coordination, balance and UE functional use, ! Rest break, no LOB.                      OT Short Term Goals - 07/13/16 1111      OT SHORT TERM GOAL #1   Title Pt will be independent with initial HEP--check STGs 08/11/16   Time 4   Period Weeks   Status New     OT SHORT TERM GOAL #2   Title Pt will improve coordination for ADLs as shown by improving time on 9-hole peg test by 5sec bilaterally.   Baseline R-32.87sec, L-42.41sec   Time 4   Period Weeks   Status New     OT SHORT TERM GOAL #3   Title  Pt will demo at least 100* L shoulder flex for functional reaching/ADLs with 3/10 pain or less.   Baseline 90*   Time 4   Period Weeks   Status New     OT SHORT TERM GOAL #4   Title Pt will improve L grip strength by at least 5lbs to assist with opening containers/ADLs.   Baseline 15lbs   Time 4   Period Weeks   Status New     OT SHORT TERM GOAL #5   Title Pt will verbalize understanding of memory/cognitive compensation strategies for ADLs prn.   Baseline --   Time 4   Period Weeks   Status New     Additional Short Term Goals   Additional Short Term Goals Yes     OT SHORT TERM GOAL #6   Title Pt will perform simple snack prep/home maintenance task mod I.   Time 4   Period Weeks   Status New           OT Long Term Goals - 07/13/16 1117      OT LONG TERM GOAL #1   Title Pt will be independent with updated HEP.--check LTGs 09/11/16    Time 8   Period Weeks   Status New     OT LONG TERM GOAL #2   Title Pt will improve coordination for ADLs as shown by improving time on 9-hole peg test by 10sec with LUE.   Baseline 42.41sec   Time 8   Period Weeks   Status New     OT LONG TERM GOAL #3   Title Pt will demo at least 110* L shoulder flex for functional reaching/ADLs with less than 2/10 pain.   Baseline 90*   Time 8   Period Weeks   Status New     OT LONG TERM GOAL #4   Title Pt will improve L grip strength by at least 15lbs to assist with opening containers/ADLs.   Baseline 15lbs   Time 8   Period Weeks   Status New     OT LONG TERM GOAL #5   Title Pt will demo at least 100* L shoulder abduction for lateral reaching/ADLs with less than 3/10 pain.   Baseline 90*   Time 8   Period Weeks   Status New     Long Term Additional Goals   Additional Long Term Goals Yes     OT LONG TERM GOAL #6   Title Pt will perform simple cooking tasks/meal prep mod I.   Time 8   Period Weeks   Status New     OT LONG TERM GOAL #7   Title Pt will use LUE as nondominant assist for ADLs/IADLs at least 85% of the time   Time 8   Period Weeks   Status New               Plan - 08/07/16 1301    Clinical Impression Statement Pt is progressing towards goals. She demonstrates understanding of yellow theraband HEP.   Rehab Potential Good   OT Frequency 2x / week   OT Duration 8 weeks   OT Treatment/Interventions Self-care/ADL training;Cryotherapy;Parrafin;Therapeutic exercise;DME and/or AE instruction;Therapist, nutritional;Therapeutic activities;Patient/family education;Balance training;Cognitive remediation/compensation;Splinting;Manual Therapy;Neuromuscular education;Fluidtherapy;Ultrasound;Electrical Stimulation;Moist Heat;Energy conservation;Passive range of motion;Therapeutic exercises   Plan check to see how red putty upgrade is going(Pt is using for bilateral composite grip and right pinch)   OT Home  Exercise Plan Education provided:  Reviewed HEP for LUE ROM from hospital (  pt to continue), coordination HEP upgraded putty to red    Consulted and Agree with Plan of Care Patient;Family member/caregiver   Family Member Consulted dtr-in-law Jarrett Soho)      Patient will benefit from skilled therapeutic intervention in order to improve the following deficits and impairments:  Decreased coordination, Decreased knowledge of use of DME, Decreased strength, Impaired UE functional use, Decreased activity tolerance, Decreased range of motion, Pain, Decreased cognition, Decreased balance, Decreased mobility  Visit Diagnosis: Other abnormalities of gait and mobility  Hemiplegia and hemiparesis following cerebral infarction affecting right dominant side ([facility])  Muscle weakness (generalized)  Other lack of coordination  Acute pain of left shoulder  Stiffness of left shoulder, not elsewhere classified    Problem List Patient Active Problem List   Diagnosis Date Noted  . Gait disturbance, post-stroke 07/21/2016  . Ankle contracture, right 07/21/2016  . Post-operative pain   . Benign essential HTN   . Urinary retention   . Closed fracture of left proximal humerus   . Left pontine cerebrovascular accident ([place]) 06/26/2016  . Stroke (cerebrum) ([place]) 06/23/2016  . Blood pressure elevated without history of HTN 06/23/2016  . Left pontine stroke ([place]) 06/23/2016  . Stroke-like symptoms   . Eye movement disorder   . Closed fracture of neck of humerus with routine healing     Vicent Febles 08/07/2016, 1:03 PM  [facility] [address] [place], [place], [address] Phone: [phone]   Fax:  [phone]  Name: BABY EAGLES MRN: [id number] Date of Birth: 1935-09-25

## 2016-08-07 NOTE — Therapy (Signed)
[facility] [address] [place], [place], [address] Phone: [phone]   Fax:  [phone]  Physical Therapy Treatment  Patient Details  Name: Melanie Hendricks MRN: [id number] Date of Birth: 1935-12-11 Referring Provider: Ella Bodo  Encounter Date: 08/07/2016      PT End of Session - 08/07/16 1127    Visit Number 7   Number of Visits 16   Date for PT Re-Evaluation 09/14/16   Authorization Type Medicare, G-Code every 10th visit   PT Start Time 0845   PT Stop Time 0926   PT Time Calculation (min) 41 min   Equipment Utilized During Treatment Gait belt   Activity Tolerance Patient tolerated treatment well   Behavior During Therapy [facility] for tasks assessed/performed      Past Medical History:  Diagnosis Date  . Allergy   . Fx humeral neck   . Hepatitis   . Hyperlipidemia   . Hypertension   . Skin melanoma ([place])   . Stroke [facility])     Past Surgical History:  Procedure Laterality Date  . Melenoma removal    . TONSILLECTOMY      There were no vitals filed for this visit.      Subjective Assessment - 08/07/16 0850    Subjective Patient reports she is trying to get more comfortable with the cane, but has discomfort putting weight through her L arm from a break earlier this year.   Patient is accompained by: Family member  Daughter Jarrett Soho   Pertinent History Had CVA last month, completed inpiatient rehab last Friday   Limitations Standing;Walking;Other (comment);House hold activities   Patient Stated Goals To return to usual self, driving, gardening, mowing yard, laundry, etc   Currently in Pain? No/denies   Pain Score 0-No pain           [facility] Adult PT Treatment/Exercise - 08/07/16 1120      Ambulation/Gait   Ambulation/Gait Yes   Ambulation/Gait Assistance 4: Min guard   Ambulation/Gait Assistance Details Verbal and visual cues for wide BOS with gait. Tactile cues for upright posture.   Ambulation  Distance (Feet) 345 Feet   Assistive device Straight cane   Gait Pattern Step-through pattern;Decreased stride length;Trendelenburg;Narrow base of support   Ambulation Surface Level;Unlevel;Indoor   Ramp Other (comment)  min guard   Ramp Details (indicate cue type and reason) Pt able to verbalize proper technique when using cane, but unsteady with negotiating ramp.   Curb 4: Min assist   Curb Details (indicate cue type and reason) Negotiated up and down curb 2x; pt verbally instructed on ascending and descending curb with cane; min assist due to balance when descending curb. 4" step up and over on aerobic step w/ cane to simulate low curb; 10x; VC for sequencing and cane placement.   Gait Comments Gait training with straight cane with focus on wide BOS to improve balance and upright posture. At able to correct with verbal cues and responds well to visual feedback with mirrors.      High Level Balance Activities:   Performed at counter top:   Fwd/Bwd walking; Fwd/Bwd marching; lateral steps; braiding;  Heel & Toe walking; tandem walking; Single UE support for all balance activities, except braiding needed BIL UE support; 3 laps each; Demo and vc for technique and sequencing.        PT Short Term Goals - 08/07/16 1139      PT SHORT TERM GOAL #1   Title Patient will demonstrate  decreased fall risk with Berg balance score 45/56 or greater. (all STGs due 08/10/2016)   Time 4   Period Weeks   Status New     PT SHORT TERM GOAL #2   Title Patient to complete TUG and set goal as appropriate for fall risk reduction.   Time 1   Period Weeks   Status New     PT SHORT TERM GOAL #3   Title Patient to verbalize CVA warning signs and risk factors with modifications for prevention.    Time 4   Period Weeks   Status New     PT SHORT TERM GOAL #4   Title Patient to ambulate indoor surfaces with cane and S over 300' no LOB.    Time 4   Period Weeks   Status New           PT Long Term  Goals - 08/07/16 1140      PT LONG TERM GOAL #1   Title Patient to demonstrate decreased fall risk with Gait velocity >0.8 m/s (all LTGs due 09/04/16)   Time 8   Period Weeks   Status New     PT LONG TERM GOAL #2   Title Patient to be independent with HEP for balance and strength and walking program.   Time 8   Period Weeks   Status New     PT LONG TERM GOAL #3   Title Patient to ambulate indoors without device independent 300' no LOB   Time 8   Period Weeks   Status New     PT LONG TERM GOAL #4   Title Patient to ambulate outdoors with cane and no LOB 500' paved and grassy surfaces.    Time 8   Period Weeks   Status New               Plan - 08/07/16 1127    Clinical Impression Statement Patient reported putting less weight through L arm when widening BOS during gait due to improved balance. She demonstrated an improved gait pattern with visual feedback with mirrors. Patient is showing progress towards goals and should benefit from continued PT to achieve unmet goals.   Rehab Potential Good   PT Frequency 2x / week   PT Duration 8 weeks   PT Treatment/Interventions ADLs/Self Care Home Management;Functional mobility training;Stair training;Gait training;DME Instruction;Therapeutic activities;Therapeutic exercise;Neuromuscular re-education;Patient/family education;Balance training;Vestibular   PT Next Visit Plan Gait with cane, attempt unlevel surface, dynamic balance, endurance   Consulted and Agree with Plan of Care Patient;Family member/caregiver   Family Member Consulted daughter in law      Patient will benefit from skilled therapeutic intervention in order to improve the following deficits and impairments:  Abnormal gait, Impaired UE functional use, Decreased safety awareness, Decreased endurance, Decreased activity tolerance, Decreased balance, Decreased mobility, Decreased strength, Decreased knowledge of use of DME, Pain, Decreased cognition  Visit  Diagnosis: Other abnormalities of gait and mobility  Muscle weakness (generalized)  Abnormality of gait     Problem List Patient Active Problem List   Diagnosis Date Noted  . Gait disturbance, post-stroke 07/21/2016  . Ankle contracture, right 07/21/2016  . Post-operative pain   . Benign essential HTN   . Urinary retention   . Closed fracture of left proximal humerus   . Left pontine cerebrovascular accident ([place]) 06/26/2016  . Stroke (cerebrum) ([place]) 06/23/2016  . Blood pressure elevated without history of HTN 06/23/2016  . Left pontine stroke ([place]) 06/23/2016  . Stroke-like  symptoms   . Eye movement disorder   . Closed fracture of neck of humerus with routine healing     Benjiman Core 08/07/2016, 3:40 PM  [facility] [address] [place], [place], [address] Phone: [phone]   Fax:  [phone]  Name: Melanie Hendricks MRN: [id number] Date of Birth: 01/03/1936

## 2016-08-11 ENCOUNTER — Ambulatory Visit: Payer: Medicare Other | Admitting: *Deleted

## 2016-08-11 ENCOUNTER — Encounter: Payer: Self-pay | Admitting: Physical Therapy

## 2016-08-11 ENCOUNTER — Encounter: Payer: Self-pay | Admitting: *Deleted

## 2016-08-11 ENCOUNTER — Ambulatory Visit: Payer: Medicare Other | Admitting: Physical Therapy

## 2016-08-11 DIAGNOSIS — R2689 Other abnormalities of gait and mobility: Secondary | ICD-10-CM

## 2016-08-11 DIAGNOSIS — M6281 Muscle weakness (generalized): Secondary | ICD-10-CM

## 2016-08-11 DIAGNOSIS — I69351 Hemiplegia and hemiparesis following cerebral infarction affecting right dominant side: Secondary | ICD-10-CM

## 2016-08-11 DIAGNOSIS — M25512 Pain in left shoulder: Secondary | ICD-10-CM

## 2016-08-11 DIAGNOSIS — R269 Unspecified abnormalities of gait and mobility: Secondary | ICD-10-CM

## 2016-08-11 DIAGNOSIS — R278 Other lack of coordination: Secondary | ICD-10-CM

## 2016-08-11 NOTE — Therapy (Signed)
[facility] [address] [place], [place], [address] Phone: [phone]   Fax:  [phone]  Physical Therapy Treatment  Patient Details  Name: Melanie Hendricks MRN: [id number] Date of Birth: 09/17/1935 Referring Provider: Ella Bodo  Encounter Date: 08/11/2016      PT End of Session - 08/11/16 1217    Visit Number 8   Number of Visits 16   Date for PT Re-Evaluation 09/14/16   Authorization Type Medicare, G-Code every 10th visit   PT Start Time 0845   PT Stop Time 0928   PT Time Calculation (min) 43 min   Equipment Utilized During Treatment Gait belt   Activity Tolerance Patient tolerated treatment well   Behavior During Therapy [facility] for tasks assessed/performed      Past Medical History:  Diagnosis Date  . Allergy   . Fx humeral neck   . Hepatitis   . Hyperlipidemia   . Hypertension   . Skin melanoma ([place])   . Stroke [facility])     Past Surgical History:  Procedure Laterality Date  . Melenoma removal    . TONSILLECTOMY      There were no vitals filed for this visit.      Subjective Assessment - 08/11/16 0847    Subjective Patient reports her arm is feeling stiff when using the cane for long periods of time and she feels like she isn't picking up her feet enough   Patient is accompained by: Family member  Daughter Jarrett Soho   Pertinent History Had CVA last month, completed inpiatient rehab last Friday   Limitations Standing;Walking;Other (comment);House hold activities   Patient Stated Goals To return to usual self, driving, gardening, mowing yard, laundry, etc   Currently in Pain? No/denies          [facility] PT Assessment - 08/11/16 0852      Berg Balance Test   Sit to Stand Able to stand without using hands and stabilize independently   Standing Unsupported Able to stand 2 minutes with supervision   Sitting with Back Unsupported but Feet Supported on Floor or Stool Able to sit safely and securely 2  minutes   Stand to Sit Sits safely with minimal use of hands   Transfers Able to transfer safely, minor use of hands   Standing Unsupported with Eyes Closed Able to stand 10 seconds safely   Standing Ubsupported with Feet Together Able to place feet together independently and stand 1 minute safely   From Standing, Reach Forward with Outstretched Arm Can reach forward >12 cm safely (5")  6in   From Standing Position, Pick up Object from Floor Able to pick up shoe, needs supervision   From Standing Position, Turn to Look Behind Over each Shoulder Looks behind from both sides and weight shifts well   Turn 360 Degrees Able to turn 360 degrees safely but slowly   Standing Unsupported, Alternately Place Feet on Step/Stool Able to stand independently and complete 8 steps >20 seconds   Standing Unsupported, One Foot in Front Able to plae foot ahead of the other independently and hold 30 seconds   Standing on One Leg Tries to lift leg/unable to hold 3 seconds but remains standing independently   Total Score 46   Berg comment: Moderate (>50%) fall risk      Timed Up and Go Test   TUG Normal TUG   Normal TUG (seconds) 17.49   TUG Comments TUG initially performed on 11/16 in 16.87 sec  [facility] PT Treatment/Exercise - 08/11/16 1214      Ambulation/Gait   Ambulation/Gait Yes   Ambulation/Gait Assistance 5: Supervision   Ambulation/Gait Assistance Details cues for wide BOS, forward gaze   Ambulation Distance (Feet) 345 Feet   Assistive device Straight cane   Gait Pattern Step-through pattern;Decreased stride length;Trendelenburg;Narrow base of support   Ambulation Surface Level;Indoor           PT Education - 08/11/16 1216    Education provided Yes   Education Details Went over stroke prevention hand out with patient and daughter   Person(s) Educated Patient;Child(ren)   Methods Explanation;Demonstration;Handout   Comprehension Need further instruction;Verbalized  understanding          PT Short Term Goals - 08/11/16 0908      PT SHORT TERM GOAL #1   Title Patient will demonstrate decreased fall risk with Berg balance score 45/56 or greater. (all STGs due 08/10/2016)   Baseline 08/11/2016 Scored 46/56 today   Time 4   Period Weeks   Status Achieved     PT SHORT TERM GOAL #2   Title Patient to complete TUG and set goal as appropriate for fall risk reduction.   Baseline Tug performed today, completed in 17.49 sec. Was previously performed on 11/16 in 16.87 sec. No goal has been set   Time 1   Period Weeks   Status On-going     PT SHORT TERM GOAL #3   Title Patient to verbalize CVA warning signs and risk factors with modifications for prevention.    Baseline Patient provided with stroke prevention handout today.   Time 4   Period Weeks   Status On-going     PT SHORT TERM GOAL #4   Title Patient to ambulate indoor surfaces with cane and S over 300' no LOB.    Baseline 08/11/16 Met Patient ambulated 345 with S today   Time 4   Period Weeks   Status Achieved           PT Long Term Goals - 08/07/16 1140      PT LONG TERM GOAL #1   Title Patient to demonstrate decreased fall risk with Gait velocity >0.8 m/s (all LTGs due 09/04/16)   Time 8   Period Weeks   Status New     PT LONG TERM GOAL #2   Title Patient to be independent with HEP for balance and strength and walking program.   Time 8   Period Weeks   Status New     PT LONG TERM GOAL #3   Title Patient to ambulate indoors without device independent 300' no LOB   Time 8   Period Weeks   Status New     PT LONG TERM GOAL #4   Title Patient to ambulate outdoors with cane and no LOB 500' paved and grassy surfaces.    Time 8   Period Weeks   Status New               Plan - 08/11/16 1217    Clinical Impression Statement Today's skilled session focused on checking STGs. She achieved 2 of 4 STGs and is progressing towards the remaining. She should benefit from  continued PT to achieve unmet goals.   Rehab Potential Good   PT Frequency 2x / week   PT Duration 8 weeks   PT Treatment/Interventions ADLs/Self Care Home Management;Functional mobility training;Stair training;Gait training;DME Instruction;Therapeutic activities;Therapeutic exercise;Neuromuscular re-education;Patient/family education;Balance training;Vestibular   PT Next Visit Plan  Gait with cane, attempt unlevel surface, dynamic balance, endurance   Consulted and Agree with Plan of Care Patient;Family member/caregiver   Family Member Consulted daughter in law      Patient will benefit from skilled therapeutic intervention in order to improve the following deficits and impairments:  Abnormal gait, Impaired UE functional use, Decreased safety awareness, Decreased endurance, Decreased activity tolerance, Decreased balance, Decreased mobility, Decreased strength, Decreased knowledge of use of DME, Pain, Decreased cognition  Visit Diagnosis: Muscle weakness (generalized)  Abnormality of gait  Other abnormalities of gait and mobility     Problem List Patient Active Problem List   Diagnosis Date Noted  . Gait disturbance, post-stroke 07/21/2016  . Ankle contracture, right 07/21/2016  . Post-operative pain   . Benign essential HTN   . Urinary retention   . Closed fracture of left proximal humerus   . Left pontine cerebrovascular accident ([place]) 06/26/2016  . Stroke (cerebrum) ([place]) 06/23/2016  . Blood pressure elevated without history of HTN 06/23/2016  . Left pontine stroke ([place]) 06/23/2016  . Stroke-like symptoms   . Eye movement disorder   . Closed fracture of neck of humerus with routine healing     Benjiman Core, SPTA 08/11/2016, 12:26 PM  [facility] [address] [place], [place], [address] Phone: [phone]   Fax:  [phone]  Name: Melanie Hendricks MRN: [id number] Date of Birth: February 12, 1936

## 2016-08-11 NOTE — Therapy (Signed)
[facility] [address] [place], [place], [address] Phone: [phone]   Fax:  [phone]  Occupational Therapy Treatment  Patient Details  Name: Melanie Hendricks MRN: [id number] Date of Birth: [birth year] Referring Provider: Dr. Alysia Penna  Encounter Date: 08/11/2016      OT End of Session - 08/11/16 0956    Visit Number 8   Number of Visits 17   Date for OT Re-Evaluation 09/11/16   Authorization Type [employer] Medicare, no visit limit/auth; needs G-code   Authorization - Visit Number 8   Authorization - Number of Visits 10   OT Start Time 0800   OT Stop Time 0840   OT Time Calculation (min) 40 min   Activity Tolerance Patient tolerated treatment well   Behavior During Therapy [facility] for tasks assessed/performed      Past Medical History:  Diagnosis Date  . Allergy   . Fx humeral neck   . Hepatitis   . Hyperlipidemia   . Hypertension   . Skin melanoma ([place])   . Stroke [facility])     Past Surgical History:  Procedure Laterality Date  . Melenoma removal    . TONSILLECTOMY      There were no vitals filed for this visit.      Subjective Assessment - 08/11/16 0806    Subjective  Pt states that functional activities are getting easier at home. "I can hold a book now in my left hand and reach better"   Patient is accompained by: Family member   Pertinent History hx of fall; L humerus fx 05/14/16 (s/p 8 wks at time of eval)--WBAT, ok for ROM (Dr. Tania Ade following); monitored for skin CA hx; hearing loss (particularly R side)   Limitations fall risk; L humerus fx 05/14/16 (s/p 8 wks at time of eval)--WBAT, ok for ROM (Dr. Tania Ade following), no driving, hearing loss   Patient Stated Goals return to being active   Currently in Pain? No/denies   Pain Score 0-No pain            [facility] OT Assessment - 08/11/16 0001      Coordination   9 Hole Peg Test Right;Left   Right 9 Hole Peg Test 28.19  seconds   Left 9 Hole Peg Test 31.69 seconds     AROM   Overall AROM  Deficits   Overall AROM Comments LUE shoulder flexion = 125* painfree     Hand Function   Right Hand Grip (lbs) 48   Left Hand Grip (lbs) 39                  OT Treatments/Exercises (OP) - 08/11/16 0001      ADLs   ADL Comments Pt reports Mod I for simple meal and snack prep. Has been making cheese sandwich at home,; "I can fry and egg, I made a bean burrito" Pt also reports doing some home making tasks - sitting to vaccum, standing and sweeping kitchen floor, sitting to fold laundry etc.  Functional activities to check STG's. Nice gains noted, please see STG's.     Exercises   Exercises Shoulder     Shoulder Exercises: Seated   Other Seated Exercises Seated cane exercises with focus on LUE ROM. Stress hold for count of 3-5 seconds. Min Vc's for positioning and follow through (x10 reps each ABD, shoulder bilateral flexion and LUE flexion; ER).   Other Seated Exercises Reviewed and performed grip & pinch with red putty  for R hand and yellow for left hand grip and pinches (note that red putty caused pain in thumb CMC therefore changed to yellow for left hand).                OT Education - 08/11/16 [phone]    Education provided Yes   Education Details Red putty for R UE grip and pinches; yellow putty for grip and pinches secondary to c/o [facility] joint pain with red   Person(s) Educated Patient;Caregiver(s)   Methods Explanation;Demonstration   Comprehension Verbalized understanding;Returned demonstration          OT Short Term Goals - 08/11/16 1000      OT SHORT TERM GOAL #1   Title Pt will be independent with initial HEP--check STGs 08/11/16   Baseline Pt demonstrated I'ly in clinic and verbalized.   Time 4   Period Weeks   Status Achieved     OT SHORT TERM GOAL #2   Title Pt will improve coordination for ADLs as shown by improving time on 9-hole peg test by 5sec bilaterally.   Baseline  R-32.87sec, L-42.41sec; 08/11/16 R= 28.17 sec, L= 31.69 sec   Time 4   Period Weeks   Status Achieved     OT SHORT TERM GOAL #3   Title Pt will demo at least 100* L shoulder flex for functional reaching/ADLs with 3/10 pain or less.   Baseline 90* @ Eval; 08/11/16 = 125* painfree   Time 4   Period Weeks   Status Achieved     OT SHORT TERM GOAL #4   Title Pt will improve L grip strength by at least 5lbs to assist with opening containers/ADLs.   Baseline 15lbs at Eval; 08/11/16 R= 48# vs L = 39#   Time 4   Period Weeks   Status Achieved     OT SHORT TERM GOAL #5   Title Pt will verbalize understanding of memory/cognitive compensation strategies for ADLs prn.   Time 4   Period Weeks   Status On-going     OT SHORT TERM GOAL #6   Title Pt will perform simple snack prep/home maintenance task mod I.   Time 4   Period Weeks   Status Achieved           OT Long Term Goals - 07/13/16 1117      OT LONG TERM GOAL #1   Title Pt will be independent with updated HEP.--check LTGs 09/11/16   Time 8   Period Weeks   Status New     OT LONG TERM GOAL #2   Title Pt will improve coordination for ADLs as shown by improving time on 9-hole peg test by 10sec with LUE.   Baseline 42.41sec   Time 8   Period Weeks   Status New     OT LONG TERM GOAL #3   Title Pt will demo at least 110* L shoulder flex for functional reaching/ADLs with less than 2/10 pain.   Baseline 90*   Time 8   Period Weeks   Status New     OT LONG TERM GOAL #4   Title Pt will improve L grip strength by at least 15lbs to assist with opening containers/ADLs.   Baseline 15lbs   Time 8   Period Weeks   Status New     OT LONG TERM GOAL #5   Title Pt will demo at least 100* L shoulder abduction for lateral reaching/ADLs with less than 3/10 pain.   Baseline 90*  Time 8   Period Weeks   Status New     Long Term Additional Goals   Additional Long Term Goals Yes     OT LONG TERM GOAL #6   Title Pt will perform  simple cooking tasks/meal prep mod I.   Time 8   Period Weeks   Status New     OT LONG TERM GOAL #7   Title Pt will use LUE as nondominant assist for ADLs/IADLs at least 85% of the time   Time 8   Period Weeks   Status New               Plan - Aug 20, 2016 0956    Clinical Impression Statement Pt has made excellent gains toward STG's as noted today. She has currently met 5/6 STG's. She should benefit from progression of program toward LTG's.   Rehab Potential Good   OT Frequency 2x / week   OT Duration 8 weeks   OT Treatment/Interventions Self-care/ADL training;Cryotherapy;Parrafin;Therapeutic exercise;DME and/or AE instruction;Therapist, nutritional;Therapeutic activities;Patient/family education;Balance training;Cognitive remediation/compensation;Splinting;Manual Therapy;Neuromuscular education;Fluidtherapy;Ultrasound;Electrical Stimulation;Moist Heat;Energy conservation;Passive range of motion;Therapeutic exercises   Plan Issue Memory compensation strategies for HEP. UBE, Functional use and reach bilateral UE's.   OT Home Exercise Plan Education provided:  Reviewed HEP for LUE ROM from hospital (pt to continue), coordination HEP upgraded putty to red    Consulted and Agree with Plan of Care Patient;Family member/caregiver   Family Member Consulted dtr-in-law Jarrett Soho)      Patient will benefit from skilled therapeutic intervention in order to improve the following deficits and impairments:  Decreased coordination, Decreased knowledge of use of DME, Decreased strength, Impaired UE functional use, Decreased activity tolerance, Decreased range of motion, Pain, Decreased cognition, Decreased balance, Decreased mobility  Visit Diagnosis: Muscle weakness (generalized)  Other lack of coordination  Acute pain of left shoulder  Hemiplegia and hemiparesis following cerebral infarction affecting right dominant side (HCC)  Other abnormalities of gait and mobility      G-Codes  - 08/20/2016 1002    Functional Assessment Tool Used L shoulder AROM 125* AROM shoulder flex;  L grip strength 39lbs; 9-hole peg test:  R = 28.17sec, L- 31.69sec   Functional Limitation Carrying, moving and handling objects   Carrying, Moving and Handling Objects Current Status (D5686) At least 20 percent but less than 40 percent impaired, limited or restricted   Carrying, Moving and Handling Objects Goal Status (H6837) At least 1 percent but less than 20 percent impaired, limited or restricted      Problem List Patient Active Problem List   Diagnosis Date Noted  . Gait disturbance, post-stroke 07/21/2016  . Ankle contracture, right 07/21/2016  . Post-operative pain   . Benign essential HTN   . Urinary retention   . Closed fracture of left proximal humerus   . Left pontine cerebrovascular accident ([place]) 06/26/2016  . Stroke (cerebrum) ([place]) 06/23/2016  . Blood pressure elevated without history of HTN 06/23/2016  . Left pontine stroke ([place]) 06/23/2016  . Stroke-like symptoms   . Eye movement disorder   . Closed fracture of neck of humerus with routine healing     Barnhill, Amy Ardath Sax, OTR/L Aug 20, 2016, 10:04 AM  [facility] [address] [place], [place], [address] Phone: [phone]   Fax:  [phone]  Name: Melanie Hendricks MRN: [id number] Date of Birth: Nov 25, 1935

## 2016-08-11 NOTE — Patient Instructions (Signed)
Stroke Prevention °Some health problems and behaviors may make it more likely for you to have a stroke. Below are ways to lessen your risk of having a stroke. °· Be active for at least 30 minutes on most or all days. °· Do not smoke. Try not to be around others who smoke. °· Do not drink too much alcohol. °¨ Do not have more than 2 drinks a day if you are a man. °¨ Do not have more than 1 drink a day if you are a woman and are not pregnant. °· Eat healthy foods, such as fruits and vegetables. If you were put on a specific diet, follow the diet as told. °· Keep your cholesterol levels under control through diet and medicines. Look for foods that are low in saturated fat, trans fat, cholesterol, and are high in fiber. °· If you have diabetes, follow all diet plans and take your medicine as told. °· Ask your doctor if you need treatment to lower your blood pressure. If you have high blood pressure (hypertension), follow all diet plans and take your medicine as told by your doctor. °· If you are 18-39 years old, have your blood pressure checked every 3-5 years. If you are age 40 or older, have your blood pressure checked every year. °· Keep a healthy weight. Eat foods that are low in calories, salt, saturated fat, trans fat, and cholesterol. °· Do not take drugs. °· Avoid birth control pills, if this applies. Talk to your doctor about the risks of taking birth control pills. °· Talk to your doctor if you have sleep problems (sleep apnea). °· Take all medicine as told by your doctor. °¨ You may be told to take aspirin or blood thinner medicine. Take this medicine as told by your doctor. °¨ Understand your medicine instructions. °· Make sure any other conditions you have are being taken care of. °Get help right away if: °· You suddenly lose feeling (you feel numb) or have weakness in your face, arm, or leg. °· Your face or eyelid hangs down to one side. °· You suddenly feel confused. °· You have trouble talking (aphasia)  or understanding what people are saying. °· You suddenly have trouble seeing in one or both eyes. °· You suddenly have trouble walking. °· You are dizzy. °· You lose your balance or your movements are clumsy (uncoordinated). °· You suddenly have a very bad headache and you do not know the cause. °· You have new chest pain. °· Your heart feels like it is fluttering or skipping a beat (irregular heartbeat). °Do not wait to see if the symptoms above go away. Get help right away. Call your local emergency services (911 in [place] Do not drive yourself to the hospital.  °This information is not intended to replace advice given to you by your health care provider. Make sure you discuss any questions you have with your health care provider. °Document Released: 02/23/2012 Document Revised: 01/30/2016 Document Reviewed: 02/24/2013 °Elsevier Interactive Patient Education © 2017 [employer] ° °

## 2016-08-12 ENCOUNTER — Ambulatory Visit: Payer: Medicare Other | Admitting: Occupational Therapy

## 2016-08-12 ENCOUNTER — Ambulatory Visit: Payer: Medicare Other | Admitting: Physical Therapy

## 2016-08-12 DIAGNOSIS — R278 Other lack of coordination: Secondary | ICD-10-CM

## 2016-08-12 DIAGNOSIS — R2689 Other abnormalities of gait and mobility: Secondary | ICD-10-CM

## 2016-08-12 DIAGNOSIS — M25512 Pain in left shoulder: Secondary | ICD-10-CM

## 2016-08-12 DIAGNOSIS — I69351 Hemiplegia and hemiparesis following cerebral infarction affecting right dominant side: Secondary | ICD-10-CM

## 2016-08-12 DIAGNOSIS — M6281 Muscle weakness (generalized): Secondary | ICD-10-CM

## 2016-08-12 DIAGNOSIS — M25612 Stiffness of left shoulder, not elsewhere classified: Secondary | ICD-10-CM

## 2016-08-12 NOTE — Therapy (Signed)
[facility] [address] [place], [place], [address] Phone: [phone]   Fax:  [phone]  Physical Therapy Treatment  Patient Details  Name: Melanie Hendricks MRN: [id number] Date of Birth: May 06, 1936 Referring Provider: Ella Bodo  Encounter Date: 08/12/2016      PT End of Session - 08/12/16 2043    Visit Number 9   Number of Visits 16   Date for PT Re-Evaluation 09/14/16   Authorization Type Medicare, G-Code every 10th visit   PT Start Time 0804   PT Stop Time 0844   PT Time Calculation (min) 40 min   Equipment Utilized During Treatment Gait belt   Activity Tolerance Patient tolerated treatment well   Behavior During Therapy [facility] for tasks assessed/performed      Past Medical History:  Diagnosis Date  . Allergy   . Fx humeral neck   . Hepatitis   . Hyperlipidemia   . Hypertension   . Skin melanoma ([place])   . Stroke [facility])     Past Surgical History:  Procedure Laterality Date  . Melenoma removal    . TONSILLECTOMY      There were no vitals filed for this visit.      Subjective Assessment - 08/12/16 0807    Subjective No changes since yesterday's PT visit.   Patient is accompained by: Family member  Daughter Jarrett Soho   Pertinent History Had CVA last month, completed inpiatient rehab last Friday   Limitations Standing;Walking;Other (comment);House hold activities   Patient Stated Goals To return to usual self, driving, gardening, mowing yard, laundry, etc   Currently in Pain? No/denies                         [facility] Adult PT Treatment/Exercise - 08/12/16 0001      Ambulation/Gait   Ambulation/Gait Yes   Ambulation/Gait Assistance 5: Supervision;4: Min guard  min guard with environmental scanning   Ambulation/Gait Assistance Details cues for forward gaze   Ambulation Distance (Feet) 230 Feet  then 350   Assistive device Straight cane   Gait Pattern Step-through  pattern;Decreased stride length;Trendelenburg;Narrow base of support   Ambulation Surface Level;Indoor   Pre-Gait Activities Stepping up onto and off of mat surfaces, using cane, with supervision, cues for proper foot placement.   Gait Comments Additional gait activities with starts/stops and quick change of direction with min guard assistance     Short distance gait in gym area 10-20 ft, multiple reps with supervision with cane.  No LOB noted.         Balance Exercises - 08/12/16 2040      Balance Exercises: Standing   Standing Eyes Opened Narrow base of support (BOS);Wide (BOA);Foam/compliant surface;5 reps  Head nods, on red mat with min guard assist   Tandem Stance Foam/compliant surface;5 reps  Head turns/nods-semitandem stance with min guard   Tandem Gait Forward;Upper extremity support;2 reps  then marching> tandem marching at counter   Retro Gait Foam/compliant surface;Upper extremity support;3 reps  Forward/back gait   Sidestepping Foam/compliant support;3 reps   Other Standing Exercises Alternating step taps to balance disks with intermittent UE support             PT Short Term Goals - 08/11/16 0908      PT SHORT TERM GOAL #1   Title Patient will demonstrate decreased fall risk with Berg balance score 45/56 or greater. (all STGs due 08/10/2016)   Baseline 08/11/2016 Scored 46/56 today  Time 4   Period Weeks   Status Achieved     PT SHORT TERM GOAL #2   Title Patient to complete TUG and set goal as appropriate for fall risk reduction.   Baseline Tug performed today, completed in 17.49 sec. Was previously performed on 11/16 in 16.87 sec. No goal has been set   Time 1   Period Weeks   Status On-going     PT SHORT TERM GOAL #3   Title Patient to verbalize CVA warning signs and risk factors with modifications for prevention.    Baseline Patient provided with stroke prevention handout today.   Time 4   Period Weeks   Status On-going     PT SHORT TERM  GOAL #4   Title Patient to ambulate indoor surfaces with cane and S over 300' no LOB.    Baseline 08/11/16 Met Patient ambulated 345 with S today   Time 4   Period Weeks   Status Achieved           PT Long Term Goals - 08/07/16 1140      PT LONG TERM GOAL #1   Title Patient to demonstrate decreased fall risk with Gait velocity >0.8 m/s (all LTGs due 09/04/16)   Time 8   Period Weeks   Status New     PT LONG TERM GOAL #2   Title Patient to be independent with HEP for balance and strength and walking program.   Time 8   Period Weeks   Status New     PT LONG TERM GOAL #3   Title Patient to ambulate indoors without device independent 300' no LOB   Time 8   Period Weeks   Status New     PT LONG TERM GOAL #4   Title Patient to ambulate outdoors with cane and no LOB 500' paved and grassy surfaces.    Time 8   Period Weeks   Status New               Plan - 08/12/16 2044    Clinical Impression Statement Skilled session today focused on gait training with additional challenges of environmental scanning and compliant surface activities.  Pt continues to feel that she is unsteady with cane due to uncertainty of LUE.  Pt does not experience any LOB with gait and balance activities, but does require intermittent UE support and min guard assist on compliant surfaces.  Pt will continue to benefit from further skilled PT to address balance, gait and strengthening activities.   Rehab Potential Good   PT Frequency 2x / week   PT Duration 8 weeks   PT Treatment/Interventions ADLs/Self Care Home Management;Functional mobility training;Stair training;Gait training;DME Instruction;Therapeutic activities;Therapeutic exercise;Neuromuscular re-education;Patient/family education;Balance training;Vestibular   PT Next Visit Plan GCODE NEXT VISIT; continue gait with cane, attempt unlevel surface, dynamic balance, endurance   Consulted and Agree with Plan of Care Patient;Family  member/caregiver   Family Member Consulted daughter in law      Patient will benefit from skilled therapeutic intervention in order to improve the following deficits and impairments:  Abnormal gait, Impaired UE functional use, Decreased safety awareness, Decreased endurance, Decreased activity tolerance, Decreased balance, Decreased mobility, Decreased strength, Decreased knowledge of use of DME, Pain, Decreased cognition  Visit Diagnosis: Other abnormalities of gait and mobility     Problem List Patient Active Problem List   Diagnosis Date Noted  . Gait disturbance, post-stroke 07/21/2016  . Ankle contracture, right 07/21/2016  . Post-operative pain   .  Benign essential HTN   . Urinary retention   . Closed fracture of left proximal humerus   . Left pontine cerebrovascular accident ([place]) 06/26/2016  . Stroke (cerebrum) ([place]) 06/23/2016  . Blood pressure elevated without history of HTN 06/23/2016  . Left pontine stroke ([place]) 06/23/2016  . Stroke-like symptoms   . Eye movement disorder   . Closed fracture of neck of humerus with routine healing     Meera Vasco W. 08/12/2016, 8:49 PM Frazier Butt., PT [facility] [address] [place], [place], [address] Phone: [phone]   Fax:  [phone]  Name: Melanie Hendricks MRN: [id number] Date of Birth: 1936-06-26

## 2016-08-12 NOTE — Therapy (Signed)
[facility] [address] [place], [place], [address] Phone: [phone]   Fax:  [phone]  Occupational Therapy Treatment  Patient Details  Name: Melanie Hendricks MRN: [id number] Date of Birth: July 09, 1936 Referring Provider: Dr. Alysia Penna  Encounter Date: 08/12/2016      OT End of Session - 08/12/16 0846    Visit Number 9   Number of Visits 17   Date for OT Re-Evaluation 09/11/16   Authorization Type [employer] Medicare, no visit limit/auth; needs G-code   Authorization - Visit Number 9   Authorization - Number of Visits 10   OT Start Time 0847   OT Stop Time 0930   OT Time Calculation (min) 43 min   Activity Tolerance Patient tolerated treatment well   Behavior During Therapy [facility] for tasks assessed/performed      Past Medical History:  Diagnosis Date  . Allergy   . Fx humeral neck   . Hepatitis   . Hyperlipidemia   . Hypertension   . Skin melanoma ([place])   . Stroke [facility])     Past Surgical History:  Procedure Laterality Date  . Melenoma removal    . TONSILLECTOMY      There were no vitals filed for this visit.      Subjective Assessment - 08/12/16 0846    Subjective  "Its getting better all the time"   Patient is accompained by: Family member   Pertinent History hx of fall; L humerus fx 05/14/16 (s/p 8 wks at time of eval)--WBAT, ok for ROM (Dr. Tania Ade following); monitored for skin CA hx; hearing loss (particularly R side)   Limitations fall risk; L humerus fx 05/14/16 (s/p 8 wks at time of eval)--WBAT, ok for ROM (Dr. Tania Ade following), no driving, hearing loss   Patient Stated Goals return to being active   Currently in Pain? Yes   Pain Score 1    Pain Location --  shoulder   Pain Orientation Left   Pain Descriptors / Indicators Aching   Pain Type Acute pain   Pain Onset More than a month ago   Pain Frequency Intermittent   Aggravating Factors  movement    Pain Relieving  Factors rest       Picking up blocks using gripper set on level 1 (black spring) to place in bowl requiring ER for incr sustained grip strength and shoulder endurance with LUE with min-mod difficulty.  In standing, functional reaching with each UE (alternating) to place clothespins with 1-8lb resistance with min difficulty.  Cane ex in sitting for L shoulder flex, ER, and abduction for stretch.   Arm bike x51mn level 1 for conditioning without rest (forward/backwards).                    OT Education - 08/12/16 0911    Education Details Reviewed yellow theraband HEP (pt performed demo each x10-15 with min cues) and added shoulder ext    Person(s) Educated Patient;Child(ren)   Methods Explanation;Demonstration;Handout   Comprehension Verbalized understanding;Returned demonstration;Verbal cues required  min v.c.          OT Short Term Goals - 08/11/16 1000      OT SHORT TERM GOAL #1   Title Pt will be independent with initial HEP--check STGs 08/11/16   Baseline Pt demonstrated I'ly in clinic and verbalized.   Time 4   Period Weeks   Status Achieved     OT SHORT TERM GOAL #2  Title Pt will improve coordination for ADLs as shown by improving time on 9-hole peg test by 5sec bilaterally.   Baseline R-32.87sec, L-42.41sec; 08/11/16 R= 28.17 sec, L= 31.69 sec   Time 4   Period Weeks   Status Achieved     OT SHORT TERM GOAL #3   Title Pt will demo at least 100* L shoulder flex for functional reaching/ADLs with 3/10 pain or less.   Baseline 90* @ Eval; 08/11/16 = 125* painfree   Time 4   Period Weeks   Status Achieved     OT SHORT TERM GOAL #4   Title Pt will improve L grip strength by at least 5lbs to assist with opening containers/ADLs.   Baseline 15lbs at Eval; 08/11/16 R= 48# vs L = 39#   Time 4   Period Weeks   Status Achieved     OT SHORT TERM GOAL #5   Title Pt will verbalize understanding of memory/cognitive compensation strategies for ADLs prn.    Time 4   Period Weeks   Status On-going     OT SHORT TERM GOAL #6   Title Pt will perform simple snack prep/home maintenance task mod I.   Time 4   Period Weeks   Status Achieved           OT Long Term Goals - 08/12/16 5176      OT LONG TERM GOAL #1   Title Pt will be independent with updated HEP.--check LTGs 09/11/16   Time 8   Period Weeks   Status New     OT LONG TERM GOAL #2   Title Pt will improve coordination for ADLs as shown by improving time on 9-hole peg test by 10sec with LUE.   Baseline 42.41sec   Time 8   Period Weeks   Status Achieved     OT LONG TERM GOAL #3   Title Pt will demo at least 110* L shoulder flex for functional reaching/ADLs with less than 2/10 pain.  Updated 08/12/16:  Pt will demo at least 135* L shoulder flex for functional reaching/ADLs with less than 2/10 pain.   Baseline 90*   Time 8   Period Weeks   Status Revised  08/12/16:  met initial goal and upgraded.       OT LONG TERM GOAL #4   Title Pt will improve L grip strength by at least 15lbs to assist with opening containers/ADLs.  Updated 08/12/16:  Pt will improve L grip strength to at least 42lbs to assist with opening containers/ADLs.   Baseline 15lbs   Time 8   Period Weeks   Status Revised  08/12/16  met initial goal and upgraded.     OT LONG TERM GOAL #5   Title Pt will demo at least 100* L shoulder abduction for lateral reaching/ADLs with less than 3/10 pain.   Baseline 90*   Time 8   Period Weeks   Status New     OT LONG TERM GOAL #6   Title Pt will perform simple cooking tasks/meal prep mod I.  08/12/16 updated:  Pt will perform mod complex cooking tasks/meal prep mod I.   Time 8   Period Weeks   Status Revised  08/12/16  met initial goal and upgraded.       OT LONG TERM GOAL #7   Title Pt will use LUE as nondominant assist for ADLs/IADLs at least 85% of the time   Time 8   Period Weeks   Status  Achieved  08/12/16 per pt report               Plan -  08/12/16 2334    Clinical Impression Statement Pt continues to make good progress with decreasing pain and improved UE functional use.   Rehab Potential Good   OT Frequency 2x / week   OT Duration 8 weeks   OT Treatment/Interventions Self-care/ADL training;Cryotherapy;Parrafin;Therapeutic exercise;DME and/or AE instruction;Therapist, nutritional;Therapeutic activities;Patient/family education;Balance training;Cognitive remediation/compensation;Splinting;Manual Therapy;Neuromuscular education;Fluidtherapy;Ultrasound;Electrical Stimulation;Moist Heat;Energy conservation;Passive range of motion;Therapeutic exercises   Plan **G code next session, Instruct in memory compensation strategies   OT Home Exercise Plan Education provided:  Reviewed HEP for LUE ROM from hospital (pt to continue), coordination HEP upgraded putty to red; yellow theraband HEP (shoulder abduction, ext, flex, horizontal abduction)    Consulted and Agree with Plan of Care Patient;Family member/caregiver   Family Member Consulted dtr-in-law Jarrett Soho)      Patient will benefit from skilled therapeutic intervention in order to improve the following deficits and impairments:  Decreased coordination, Decreased knowledge of use of DME, Decreased strength, Impaired UE functional use, Decreased activity tolerance, Decreased range of motion, Pain, Decreased cognition, Decreased balance, Decreased mobility  Visit Diagnosis: Muscle weakness (generalized)  Hemiplegia and hemiparesis following cerebral infarction affecting right dominant side (HCC)  Other lack of coordination  Acute pain of left shoulder  Stiffness of left shoulder, not elsewhere classified      G-Codes - 08/27/2016 1002    Functional Assessment Tool Used L shoulder AROM 125* AROM shoulder flex;  L grip strength 39lbs; 9-hole peg test:  R = 28.17sec, L- 31.69sec   Functional Limitation Carrying, moving and handling objects   Carrying, Moving and Handling Objects  Current Status (D5686) At least 20 percent but less than 40 percent impaired, limited or restricted   Carrying, Moving and Handling Objects Goal Status (H6837) At least 1 percent but less than 20 percent impaired, limited or restricted      Problem List Patient Active Problem List   Diagnosis Date Noted  . Gait disturbance, post-stroke 07/21/2016  . Ankle contracture, right 07/21/2016  . Post-operative pain   . Benign essential HTN   . Urinary retention   . Closed fracture of left proximal humerus   . Left pontine cerebrovascular accident ([place]) 06/26/2016  . Stroke (cerebrum) ([place]) 06/23/2016  . Blood pressure elevated without history of HTN 06/23/2016  . Left pontine stroke ([place]) 06/23/2016  . Stroke-like symptoms   . Eye movement disorder   . Closed fracture of neck of humerus with routine healing     [facility] 08/12/2016, 9:33 AM  [facility] [address] [place], [place], [address] Phone: [phone]   Fax:  [phone]  Name: PETRONELLA SHUFORD MRN: [id number] Date of Birth: 04/24/1936   Vianne Bulls, OTR/L [facility] [address] [place], [place]  [address] [phone] phone [phone] 08/12/16 9:33 AM

## 2016-08-12 NOTE — Patient Instructions (Signed)
Strengthening: Resisted Extension   Attach one end to door.  Hold tubing in one hand, arm forward. Pull arm back, elbow straight. Repeat 10-15 times per set. Do 1-2 sessions per day.

## 2016-08-13 ENCOUNTER — Ambulatory Visit (INDEPENDENT_AMBULATORY_CARE_PROVIDER_SITE_OTHER): Payer: Medicare Other | Admitting: Neurology

## 2016-08-13 ENCOUNTER — Encounter: Payer: Self-pay | Admitting: Neurology

## 2016-08-13 DIAGNOSIS — I651 Occlusion and stenosis of basilar artery: Secondary | ICD-10-CM | POA: Insufficient documentation

## 2016-08-13 NOTE — Patient Instructions (Addendum)
Stroke Prevention Some medical conditions and behaviors are associated with an increased chance of having a stroke. You may prevent a stroke by making healthy choices and managing medical conditions. How can I reduce my risk of having a stroke?  Stay physically active. Get at least 30 minutes of activity on most or all days.  Do not smoke. It may also be helpful to avoid exposure to secondhand smoke.  Limit alcohol use. Moderate alcohol use is considered to be:  No more than 2 drinks per day for men.  No more than 1 drink per day for nonpregnant women.  Eat healthy foods. This involves:  Eating 5 or more servings of fruits and vegetables a day.  Making dietary changes that address high blood pressure (hypertension), high cholesterol, diabetes, or obesity.  Manage your cholesterol levels.  Making food choices that are high in fiber and low in saturated fat, trans fat, and cholesterol may control cholesterol levels.  Take any prescribed medicines to control cholesterol as directed by your health care provider.  Manage your diabetes.  Controlling your carbohydrate and sugar intake is recommended to manage diabetes.  Take any prescribed medicines to control diabetes as directed by your health care provider.  Control your hypertension.  Making food choices that are low in salt (sodium), saturated fat, trans fat, and cholesterol is recommended to manage hypertension.  Ask your health care provider if you need treatment to lower your blood pressure. Take any prescribed medicines to control hypertension as directed by your health care provider.  If you are 18-39 years of age, have your blood pressure checked every 3-5 years. If you are 40 years of age or older, have your blood pressure checked every year.  Maintain a healthy weight.  Reducing calorie intake and making food choices that are low in sodium, saturated fat, trans fat, and cholesterol are recommended to manage  weight.  Stop drug abuse.  Avoid taking birth control pills.  Talk to your health care provider about the risks of taking birth control pills if you are over 35 years old, smoke, get migraines, or have ever had a blood clot.  Get evaluated for sleep disorders (sleep apnea).  Talk to your health care provider about getting a sleep evaluation if you snore a lot or have excessive sleepiness.  Take medicines only as directed by your health care provider.  For some people, aspirin or blood thinners (anticoagulants) are helpful in reducing the risk of forming abnormal blood clots that can lead to stroke. If you have the irregular heart rhythm of atrial fibrillation, you should be on a blood thinner unless there is a good reason you cannot take them.  Understand all your medicine instructions.  Make sure that other conditions (such as anemia or atherosclerosis) are addressed. Get help right away if:  You have sudden weakness or numbness of the face, arm, or leg, especially on one side of the body.  Your face or eyelid droops to one side.  You have sudden confusion.  You have trouble speaking (aphasia) or understanding.  You have sudden trouble seeing in one or both eyes.  You have sudden trouble walking.  You have dizziness.  You have a loss of balance or coordination.  You have a sudden, severe headache with no known cause.  You have new chest pain or an irregular heartbeat. Any of these symptoms may represent a serious problem that is an emergency. Do not wait to see if the symptoms will go away.   Get medical help at once. Call your local emergency services (911 in [place] Do not drive yourself to the hospital. This information is not intended to replace advice given to you by your health care provider. Make sure you discuss any questions you have with your health care provider. Document Released: 10/01/2004 Document Revised: 01/30/2016 Document Reviewed: 02/24/2013 Elsevier  Interactive Patient Education  2017 [employer]  

## 2016-08-13 NOTE — Progress Notes (Signed)
[facility] [address] [place]. [place] [address] [phone]       OFFICE FOLLOW-UP NOTE  [name] Date of Birth:  12/09/1935 Medical Record Number:  [id number]   HPI: Ms Rosiak is 17 year pleasant Caucasian lady seen today for the first office follow-up visit following hospital admission for stroke in October 2017. Kalena Holzer Salteris an 80 y.o.femalewith no documented systemic medical disorder presenting with new onset slurred speech, tingling and numbness involving left arm and hand, and deviation of eyes to the right side. Patient has no previous history of stroke nor TIA. She has not been taking antiplatelet medication on a routine basis. CT scan of her head showed no acute intracranial abnormality. No clear facial droop was noted by family. Speech apparently has improved but still not quite back to baseline. Patient fractured her left humerus 5 weeks ago and has kept her left upper extremity in a sling. NIH stroke score was 2. She was LKW at 3:00 PM on 06/23/2016. Patient was not administered IV t-PA secondary to Beyond time under for treatment consideration. Shewas admitted for further evaluation and treatment. MRI scan of the brain personally reviewed shows acute nonhemorrhagic infarct in the left paramedian pons and mild changes of chronic microvascular disease. MRA of the brain showed non-visualization of the left vertebral artery and majority of the basilar artery likely due to occlusion. Transthoracic echo showed normal ejection fraction. LDL cholesterol was 147 mg percent. Hemoglobin A1c was 5.4. Carotid ultrasound showed no significant extracranial stenosis. Patient was started on statin as well as aspirin and Plavix and transferred to rehabilitation. She has done well and made gradual improvement. She was enrolled in the stroke atrial fibrillation trial and was randomized to the standard of care. She is currently at home. She is doing outpatient  therapy. She is tolerating aspirin and Plavix and only minor bruising and no bleeding. She states her blood pressure is well controlled though today it is elevated at 162/76 in office. She is tolerating Lipitor well without muscle aches or pains. ROS:   14 system review of systems is positive for  activity change, hearing loss, cough, allergies, weakness, walking difficulty PMH:  Past Medical History:  Diagnosis Date  . Allergy   . Fx humeral neck   . Hepatitis   . Hyperlipidemia   . Hypertension   . Skin melanoma ([place])   . Stroke [facility])     Social History:  Social History   Social History  . Marital status: Legally Separated    Spouse name: N/A  . Number of children: 3  . Years of education: 91   Occupational History  . Retired    Social History Main Topics  . Smoking status: Never Smoker  . Smokeless tobacco: Never Used  . Alcohol use No  . Drug use: No  . Sexual activity: Not on file   Other Topics Concern  . Not on file   Social History Narrative   Fun: Gardening    Medications:   Current Outpatient Prescriptions on File Prior to Visit  Medication Sig Dispense Refill  . acetaminophen (TYLENOL) 325 MG tablet Take 500 mg by mouth every 6 (six) hours as needed for mild pain or headache.     Marland Kitchen aspirin EC 81 MG EC tablet Take 1 tablet (81 mg total) by mouth daily.    Marland Kitchen atorvastatin (LIPITOR) 40 MG tablet Take 1 tablet (40 mg total) by mouth daily at 6 PM. 30 tablet 1  .  clopidogrel (PLAVIX) 75 MG tablet Take 1 tablet (75 mg total) by mouth daily. 30 tablet 1  . docusate sodium (COLACE) 100 MG capsule Take 1 capsule (100 mg total) by mouth 2 (two) times daily. 10 capsule 0  . lisinopril (PRINIVIL,ZESTRIL) 5 MG tablet Take 1 tablet (5 mg total) by mouth daily. 30 tablet 1  . traMADol (ULTRAM) 50 MG tablet Take 1 tablet (50 mg total) by mouth every 6 (six) hours as needed for moderate pain. 30 tablet 0   No current facility-administered medications on file prior to  visit.     Allergies:  No Known Allergies  Physical Exam General: well developed, well nourished middle-age Caucasian lady, seated, in no evident distress Head: head normocephalic and atraumatic.  Neck: supple with no carotid or supraclavicular bruits Cardiovascular: regular rate and rhythm, no murmurs Musculoskeletal: no deformity Skin:  no rash/petichiae Vascular:  Normal pulses all extremities Vitals:   08/13/16 1433  BP: (!) 162/76  Pulse: 95   Neurologic Exam Mental Status: Awake and fully alert. Oriented to place and time. Recent and remote memory intact. Attention span, concentration and fund of knowledge appropriate. Mood and affect appropriate.  Cranial Nerves: Fundoscopic exam reveals sharp disc margins. Pupils equal, briskly reactive to light. Extraocular movements full without nystagmus. Visual fields full to confrontation. Hearing intact. Facial sensation intact. Face, tongue, palate moves normally and symmetrically.  Motor: Mild weakness of right intrinsic hand muscles. Mild left grip weakness secondary to left shoulder surgery and pain. Mild weakness of right hip flexors and ankle dorsiflexors only. Normal strength and remaining muscles. Sensory.: intact to touch ,pinprick .position and vibratory sensation.  Coordination: Rapid alternating movements normal in all extremities. Finger-to-nose and heel-to-shin performed accurately bilaterally. Gait and Station: Arises from chair without difficulty. Stance is broad-based. Uses a walker.. Gait demonstrates normal stride length and mild imbalance . Able to heel, toe and tandem walk without difficulty.  Reflexes: 1+ and symmetric. Toes downgoing.   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 Vascular risk factors of hypertension hyperlipidemia. She is participating in the stroke  atrial fibrillation trial and randomized to the standard of care arm.    PLAN: I had a long d/w patient and son about her recent  Brainstem stroke,left vertebral artery occlusion, risk for recurrent stroke/TIAs, personally independently reviewed imaging studies and stroke evaluation results and answered questions.Continue aspirin and Plavix for one more month and then discontinue Plavix and stay on aspirin alone  for secondary stroke prevention and maintain strict control of hypertension with blood pressure goal below 130/90, diabetes with hemoglobin A1c goal below 6.5% and lipids with LDL cholesterol goal below 70 mg/dL. I also advised the patient to eat a healthy diet with plenty of whole grains, cereals, fruits and vegetables, exercise regularly and maintain ideal body weight .she was encouraged to continue ongoing physical and occupational therapies as well as to use a walker at all times for safety and fall prevention. Followup in the future with my nurse activation her in 6 months or call earlier if necessary. Greater than 50% of time during this 25 minute visit was spent on counseling,explanation of diagnosis, planning of further management, discussion with patient and family and coordination of care Antony Contras, MD  [facility] [address] [place], [place] [address]  Phone [phone] Fax [phone] Note: This  document was prepared with digital dictation and possible smart phrase technology. Any transcriptional errors that result from this process are unintentional

## 2016-08-18 ENCOUNTER — Ambulatory Visit: Payer: Medicare Other | Admitting: Physical Therapy

## 2016-08-18 ENCOUNTER — Encounter: Payer: Self-pay | Admitting: Physical Medicine & Rehabilitation

## 2016-08-18 ENCOUNTER — Encounter: Payer: Medicare Other | Attending: Physical Medicine & Rehabilitation

## 2016-08-18 ENCOUNTER — Ambulatory Visit: Payer: Medicare Other | Admitting: Occupational Therapy

## 2016-08-18 ENCOUNTER — Ambulatory Visit (HOSPITAL_BASED_OUTPATIENT_CLINIC_OR_DEPARTMENT_OTHER): Payer: Medicare Other | Admitting: Physical Medicine & Rehabilitation

## 2016-08-18 VITALS — BP 140/73 | HR 93 | Resp 16

## 2016-08-18 DIAGNOSIS — R269 Unspecified abnormalities of gait and mobility: Secondary | ICD-10-CM

## 2016-08-18 DIAGNOSIS — R2689 Other abnormalities of gait and mobility: Secondary | ICD-10-CM

## 2016-08-18 DIAGNOSIS — I639 Cerebral infarction, unspecified: Secondary | ICD-10-CM

## 2016-08-18 DIAGNOSIS — I69398 Other sequelae of cerebral infarction: Secondary | ICD-10-CM

## 2016-08-18 DIAGNOSIS — R2681 Unsteadiness on feet: Secondary | ICD-10-CM

## 2016-08-18 DIAGNOSIS — M25612 Stiffness of left shoulder, not elsewhere classified: Secondary | ICD-10-CM

## 2016-08-18 DIAGNOSIS — I6350 Cerebral infarction due to unspecified occlusion or stenosis of unspecified cerebral artery: Secondary | ICD-10-CM | POA: Insufficient documentation

## 2016-08-18 DIAGNOSIS — R278 Other lack of coordination: Secondary | ICD-10-CM

## 2016-08-18 DIAGNOSIS — M6281 Muscle weakness (generalized): Secondary | ICD-10-CM

## 2016-08-18 DIAGNOSIS — I69351 Hemiplegia and hemiparesis following cerebral infarction affecting right dominant side: Secondary | ICD-10-CM

## 2016-08-18 DIAGNOSIS — M25512 Pain in left shoulder: Secondary | ICD-10-CM

## 2016-08-18 NOTE — Therapy (Signed)
[facility] [address] [place], [place], [address] Phone: [phone]   Fax:  [phone]  Occupational Therapy Treatment  Patient Details  Name: Melanie Hendricks MRN: [id number] Date of Birth: Jun 18, 1936 Referring Provider: Dr. Alysia Penna  Encounter Date: 08/18/2016      OT End of Session - 08/18/16 0911    Visit Number 10   Number of Visits 17   Date for OT Re-Evaluation 09/11/16   Authorization Type [employer] Medicare, no visit limit/auth; needs G-code   Authorization - Visit Number 10   Authorization - Number of Visits 10   OT Start Time [phone]   OT Stop Time 0930   OT Time Calculation (min) 38 min   Activity Tolerance Patient tolerated treatment well   Behavior During Therapy [facility] for tasks assessed/performed      Past Medical History:  Diagnosis Date  . Allergy   . Fx humeral neck   . Hepatitis   . Hyperlipidemia   . Hypertension   . Skin melanoma ([place])   . Stroke [facility])     Past Surgical History:  Procedure Laterality Date  . Melenoma removal    . TONSILLECTOMY      There were no vitals filed for this visit.      Subjective Assessment - 08/18/16 0858    Subjective  Pt reports some shoulder pain with certain movements   Pertinent History hx of fall; L humerus fx 05/14/16 (s/p 8 wks at time of eval)--WBAT, ok for ROM (Dr. Tania Ade following); monitored for skin CA hx; hearing loss (particularly R side)   Limitations fall risk; L humerus fx 05/14/16 (s/p 8 wks at time of eval)--WBAT, ok for ROM (Dr. Tania Ade following), no driving, hearing loss   Patient Stated Goals return to being active   Currently in Pain? Yes   Pain Score 2    Pain Location Shoulder   Pain Orientation Left   Pain Descriptors / Indicators Aching   Pain Type Acute pain   Pain Onset More than a month ago   Pain Frequency Intermittent   Aggravating Factors  movement   Pain Relieving Factors rest   Multiple  Pain Sites No            Treatment: supine chest press and shoulder flexion with PVC pipe frame for gentle stretch, min v.c. Seated internal / exteranl rotation with cane with bilateral UE's min v.c. Standing for dynamic functional reaching to copy small peg design overhead with left and right UE's, min v.c. And supervision for balance. Pt required 1 rest break. UBE x 6 mins level 1 for conditioning.                    OT Short Term Goals - 08/11/16 1000      OT SHORT TERM GOAL #1   Title Pt will be independent with initial HEP--check STGs 08/11/16   Baseline Pt demonstrated I'ly in clinic and verbalized.   Time 4   Period Weeks   Status Achieved     OT SHORT TERM GOAL #2   Title Pt will improve coordination for ADLs as shown by improving time on 9-hole peg test by 5sec bilaterally.   Baseline R-32.87sec, L-42.41sec; 08/11/16 R= 28.17 sec, L= 31.69 sec   Time 4   Period Weeks   Status Achieved     OT SHORT TERM GOAL #3   Title Pt will demo at least 100* L shoulder flex for  functional reaching/ADLs with 3/10 pain or less.   Baseline 90* @ Eval; 08/11/16 = 125* painfree   Time 4   Period Weeks   Status Achieved     OT SHORT TERM GOAL #4   Title Pt will improve L grip strength by at least 5lbs to assist with opening containers/ADLs.   Baseline 15lbs at Eval; 08/11/16 R= 48# vs L = 39#   Time 4   Period Weeks   Status Achieved     OT SHORT TERM GOAL #5   Title Pt will verbalize understanding of memory/cognitive compensation strategies for ADLs prn.   Time 4   Period Weeks   Status On-going     OT SHORT TERM GOAL #6   Title Pt will perform simple snack prep/home maintenance task mod I.   Time 4   Period Weeks   Status Achieved           OT Long Term Goals - 08/12/16 4076      OT LONG TERM GOAL #1   Title Pt will be independent with updated HEP.--check LTGs 09/11/16   Time 8   Period Weeks   Status New     OT LONG TERM GOAL #2   Title Pt  will improve coordination for ADLs as shown by improving time on 9-hole peg test by 10sec with LUE.   Baseline 42.41sec   Time 8   Period Weeks   Status Achieved     OT LONG TERM GOAL #3   Title Pt will demo at least 110* L shoulder flex for functional reaching/ADLs with less than 2/10 pain.  Updated 08/12/16:  Pt will demo at least 135* L shoulder flex for functional reaching/ADLs with less than 2/10 pain.   Baseline 90*   Time 8   Period Weeks   Status Revised  08/12/16:  met initial goal and upgraded.       OT LONG TERM GOAL #4   Title Pt will improve L grip strength by at least 15lbs to assist with opening containers/ADLs.  Updated 08/12/16:  Pt will improve L grip strength to at least 42lbs to assist with opening containers/ADLs.   Baseline 15lbs   Time 8   Period Weeks   Status Revised  08/12/16  met initial goal and upgraded.     OT LONG TERM GOAL #5   Title Pt will demo at least 100* L shoulder abduction for lateral reaching/ADLs with less than 3/10 pain.   Baseline 90*   Time 8   Period Weeks   Status New     OT LONG TERM GOAL #6   Title Pt will perform simple cooking tasks/meal prep mod I.  08/12/16 updated:  Pt will perform mod complex cooking tasks/meal prep mod I.   Time 8   Period Weeks   Status Revised  08/12/16  met initial goal and upgraded.       OT LONG TERM GOAL #7   Title Pt will use LUE as nondominant assist for ADLs/IADLs at least 85% of the time   Time 8   Period Weeks   Status Achieved  08/12/16 per pt report             Patient will benefit from skilled therapeutic intervention in order to improve the following deficits and impairments:     Visit Diagnosis: Other abnormalities of gait and mobility  Hemiplegia and hemiparesis following cerebral infarction affecting right dominant side (HCC)  Muscle weakness (generalized)  Other lack  of coordination  Acute pain of left shoulder  Stiffness of left shoulder, not elsewhere  classified    Problem List Patient Active Problem List   Diagnosis Date Noted  . 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 Gait disturbance, post-stroke 07/21/2016  . Ankle contracture, right 07/21/2016  . Post-operative pain   . Benign essential HTN   . Urinary retention   . Closed fracture of left proximal humerus   . Left pontine cerebrovascular accident ([place]) 06/26/2016  . Stroke (cerebrum) ([place]) 06/23/2016  . Blood pressure elevated without history of HTN 06/23/2016  . Left pontine stroke ([place]) 06/23/2016  . Stroke-like symptoms   . Eye movement disorder   . Closed fracture of neck of humerus with routine healing     RINE,KATHRYN 08/18/2016, 9:17 AM  [facility] [address] [place], [place], [address] Phone: [phone]   Fax:  [phone]  Name: Melanie Hendricks MRN: [id number] Date of Birth: 29-Mar-1936

## 2016-08-18 NOTE — Patient Instructions (Signed)
Please continue with the ankle range of motion and strengthening exercises.  Once the therapy finishes up will need to continue home exercises

## 2016-08-18 NOTE — Therapy (Signed)
[name] [address] [place], [place], [address] Phone: [phone]   Fax:  [phone]  Physical Therapy Treatment  Patient Details  Name: Melanie Hendricks MRN: [id number] Date of Birth: 11/30/1935 Referring Provider: Ella Bodo  Encounter Date: 08/18/2016      PT End of Session - 08/18/16 1115    Visit Number 10   Number of Visits 16   Date for PT Re-Evaluation 09/14/16   Authorization Type Medicare, G-Code every 10th visit   PT Start Time 0801   PT Stop Time 0846   PT Time Calculation (min) 45 min   Equipment Utilized During Treatment Gait belt      Past Medical History:  Diagnosis Date  . Allergy   . Fx humeral neck   . Hepatitis   . Hyperlipidemia   . Hypertension   . Skin melanoma ([place])   . Stroke [facility])     Past Surgical History:  Procedure Laterality Date  . Melenoma removal    . TONSILLECTOMY      There were no vitals filed for this visit.      Subjective Assessment - 08/18/16 1107    Subjective Pt reports she only uses cane in home when someone is there   Patient is accompained by: Family member  Jarrett Soho - daughter-in-law   Pertinent History Had CVA last month, completed inpiatient rehab last Friday   Limitations Standing;Walking;Other (comment);House hold activities   Patient Stated Goals To return to usual self, driving, gardening, mowing yard, laundry, etc   Currently in Pain? Yes   Pain Score 2    Pain Location Shoulder   Pain Orientation Left                         [facility] Adult PT Treatment/Exercise - 08/18/16 0812      Transfers   Transfers Sit to Stand   Sit to Stand 5: Supervision   Comments no UE support used - 5 reps on floor, 5 reps on blue Airex     Ambulation/Gait   Ambulation/Gait Yes   Ambulation/Gait Assistance 4: Min guard   Ambulation/Gait Assistance Details cues for R arm swing and to stand erect   Ambulation Distance (Feet) 250 Feet   Assistive device None   Gait Pattern Step-through pattern   Ambulation Surface Level;Indoor   Stairs Yes   Stairs Assistance 4: Min guard   Stairs Assistance Details (indicate cue type and reason) cues to have toes of stance leg in descension hang over edge to increase ankle ROM and to shift weight anteriorly   Stair Management Technique No rails   Number of Stairs 4  2 reps   Height of Stairs 6             Balance Exercises - 08/18/16 1113      Balance Exercises: Standing   Rockerboard Anterior/posterior;EO;30 seconds;Intermittent UE support   Step Ups Forward;6 inch   Sidestepping 1 rep     NeuroRe-ed:  Pt performed forwards and backwards amb. Approx. 30' tossing ball and catching with CGA Alternate tap ups to 2nd step with CGA Sidestepping, crossovers and stepping behind inside bars with UE support prn Rockerboard anterior/posteriorly with UE support prn Marching forwards/backwards inside // bars with UE support prn  Stepping over and back of black balance beam with UE support prn         PT Short Term Goals - 08/18/16 1121  PT SHORT TERM GOAL #1   Title Patient will demonstrate decreased fall risk with Berg balance score 45/56 or greater. (all STGs due 08/10/2016)   Baseline 08/11/2016 Scored 46/56 today   Status Achieved     PT SHORT TERM GOAL #2   Title Patient to complete TUG and set goal as appropriate for fall risk reduction.   Baseline Tug performed today, completed in 17.49 sec. Was previously performed on 11/16 in 16.87 sec. No goal has been set   Status On-going     PT [place] #3   Title Patient to verbalize CVA warning signs and risk factors with modifications for prevention.    Baseline Patient provided with stroke prevention handout today.   Status On-going     PT SHORT TERM GOAL #4   Title Patient to ambulate indoor surfaces with cane and S over 300' no LOB.    Baseline 08/11/16 Met Patient ambulated 345 with S today   Status  Achieved           PT Long Term Goals - 08/07/16 1140      PT LONG TERM GOAL #1   Title Patient to demonstrate decreased fall risk with Gait velocity >0.8 m/s (all LTGs due 09/04/16)   Time 8   Period Weeks   Status New     PT LONG TERM GOAL #2   Title Patient to be independent with HEP for balance and strength and walking program.   Time 8   Period Weeks   Status New     PT LONG TERM GOAL #3   Title Patient to ambulate indoors without device independent 300' no LOB   Time 8   Period Weeks   Status New     PT LONG TERM GOAL #4   Title Patient to ambulate outdoors with cane and no LOB 500' paved and grassy surfaces.    Time 8   Period Weeks   Status New               Plan - August 28, 2016 1115    Clinical Impression Statement Pt improving with dynamic balance and gait activities - pt able to safely amb. with cane, however, appears to be slightly guarded with minimal R arm swing noted; pt appears to be less guarded without use of cane with 1 less coordination task required; pt continues to required CGA and/or intermittent UE support with SLS activities due to SLS deficits   Rehab Potential Good   PT Frequency 2x / week   PT Duration 8 weeks   PT Treatment/Interventions ADLs/Self Care Home Management;Functional mobility training;Stair training;Gait training;DME Instruction;Therapeutic activities;Therapeutic exercise;Neuromuscular re-education;Patient/family education;Balance training;Vestibular   PT Next Visit Plan gait train with/without SPC - cont high level balance/gait activities   Consulted and Agree with Plan of Care Patient;Family member/caregiver   Family Member Consulted daughter in law - Jarrett Soho      Patient will benefit from skilled therapeutic intervention in order to improve the following deficits and impairments:  Abnormal gait, Impaired UE functional use, Decreased safety awareness, Decreased endurance, Decreased activity tolerance, Decreased balance,  Decreased mobility, Decreased strength, Decreased knowledge of use of DME, Pain, Decreased cognition  Visit Diagnosis: Other abnormalities of gait and mobility  Unsteadiness on feet       G-Codes - 08-28-16 1139    Functional Assessment Tool Used Berg score 46/56   Mobility: Walking and Moving Around Current Status (C1638) At least 20 percent but less than 40 percent impaired, limited  or restricted   Mobility: Walking and Moving Around Goal Status [phone]) At least 1 percent but less than 20 percent impaired, limited or restricted      Problem List Patient Active Problem List   Diagnosis Date Noted  . 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 Gait disturbance, post-stroke 07/21/2016  . Ankle contracture, right 07/21/2016  . Post-operative pain   . Benign essential HTN   . Urinary retention   . Closed fracture of left proximal humerus   . Left pontine cerebrovascular accident ([place]) 06/26/2016  . Stroke (cerebrum) ([place]) 06/23/2016  . Blood pressure elevated without history of HTN 06/23/2016  . Left pontine stroke ([place]) 06/23/2016  . Stroke-like symptoms   . Eye movement disorder   . Closed fracture of neck of humerus with routine healing    Physical Therapy Progress Note  Dates of Reporting Period: 07-13-16 to 08-18-16  Objective Reports of Subjective Statement: pt continues to use RW for assistance with amb.; reports using cane in home only when someone is there  Objective Measurements: Berg score 46/56  Goal Update: See above for progress towards goals Plan: See above for treatment/interventions - plan is to continue with high level balance and gait training  Reason Skilled Services are Required: Pt continues to have decreased high level balance skills and dependency with amb with pt using RW for assistance with community and household amb.  Alda Lea, PT 08/18/2016, 11:44 AM  [facility] [address]  [place], [place], [address] Phone: [phone]   Fax:  [phone]  Name: Melanie Hendricks MRN: [id number] Date of Birth: 02/11/1936

## 2016-08-18 NOTE — Progress Notes (Signed)
Subjective:    Patient ID: Melanie Hendricks, female    DOB: April 06, 1936, 80 y.o.   MRN: [id number] Left paramedian pontine infarct HPI No assist needed for ADLs and showering Cooking more  Uses SPC in home and walker outside or if no one is home Released by ortho in regards to Left shoulder No falls since Sept Doing Achilles stretching and strengthening Seen by Neuro Continues to attend outpatient therapy with PT and OT, working on balance, left upper range of motion Pain Inventory Average Pain 1 Pain Right Now 0 My pain is intermittent  In the last 24 hours, has pain interfered with the following? General activity 0 Relation with others 0 Enjoyment of life 0 What TIME of day is your pain at its worst? night Sleep (in general) Fair  Pain is worse with: unsure Pain improves with: rest and medication Relief from Meds: no answer  Mobility use a walker ability to climb steps?  yes do you drive?  no  Function retired  Neuro/Psych weakness trouble walking  Prior Studies Any changes since last visit?  no  Physicians involved in your care Any changes since last visit?  no   Family History  Problem Relation Age of Onset  . Stomach cancer Mother   . Thyroid cancer Father   . Heart attack Father   . Seizures Brother   . Heart failure Brother   . Hypertension Maternal Grandmother    Social History   Social History  . Marital status: Legally Separated    Spouse name: N/A  . Number of children: 3  . Years of education: 20   Occupational History  . Retired    Social History Main Topics  . Smoking status: Never Smoker  . Smokeless tobacco: Never Used  . Alcohol use No  . Drug use: No  . Sexual activity: Not Asked   Other Topics Concern  . None   Social History Narrative   Fun: Gardening   Past Surgical History:  Procedure Laterality Date  . Melenoma removal    . TONSILLECTOMY     Past Medical History:  Diagnosis Date  . Allergy   . Fx humeral  neck   . Hepatitis   . Hyperlipidemia   . Hypertension   . Skin melanoma ([place])   . Stroke ([place])    BP 140/73   Pulse 93   Resp 16   SpO2 95%   Opioid Risk Score:   Fall Risk Score:  `1  Depression screen PHQ 2/9  Depression screen [facility] 2/9 08/18/2016 07/21/2016  Decreased Interest 0 0  Down, Depressed, Hopeless 0 0  PHQ - 2 Score 0 0  Altered sleeping - 1  Tired, decreased energy - 1  Change in appetite - 0  Feeling bad or failure about yourself  - 0  Trouble concentrating - 0  Moving slowly or fidgety/restless - 0  Suicidal thoughts - 0  PHQ-9 Score - 2   Review of Systems  HENT: Negative.   Eyes: Negative.   Respiratory: Negative.   Cardiovascular: Negative.   Gastrointestinal: Negative.   Endocrine: Negative.   Genitourinary: Negative.   Musculoskeletal: Positive for gait problem.  Skin: Negative.   Allergic/Immunologic: Negative.   Neurological: Positive for weakness.  Hematological: Negative.   Psychiatric/Behavioral: Negative.   All other systems reviewed and are negative.      Objective:   Physical Exam  Constitutional: She is oriented to person, place, and time. She appears well-developed and well-nourished.  HENT:  Head: Normocephalic and atraumatic.  Eyes: Conjunctivae and EOM are normal. Pupils are equal, round, and reactive to light.  Musculoskeletal:       Left shoulder: She exhibits decreased range of motion and decreased strength.  Neurological: She is alert and oriented to person, place, and time.  Skin: Skin is warm and dry.  Psychiatric: She has a normal mood and affect. Her behavior is normal. Judgment and thought content normal.  Nursing note and vitals reviewed.  4/5 bilateral deltoid, biceps, triceps, grip, hip flexor, knee extensor, ankle dorsiflexor Mild dysmetria bilateral fingers. Finger  Right ankle dorsiflexion limited to neutral     Assessment & Plan:  1.  L paramedian pontine infarct with Right hemiparesis Making  excellent recovery, left shoulder contracture is due to fall with Left humeral fracture that is now healed. Pt released by Ortho RTC 1 month

## 2016-08-21 ENCOUNTER — Ambulatory Visit: Payer: Medicare Other | Admitting: Occupational Therapy

## 2016-08-21 ENCOUNTER — Encounter: Payer: Self-pay | Admitting: Physical Therapy

## 2016-08-21 ENCOUNTER — Ambulatory Visit: Payer: Medicare Other | Admitting: Physical Therapy

## 2016-08-21 DIAGNOSIS — R2689 Other abnormalities of gait and mobility: Secondary | ICD-10-CM | POA: Diagnosis not present

## 2016-08-21 DIAGNOSIS — M25512 Pain in left shoulder: Secondary | ICD-10-CM

## 2016-08-21 DIAGNOSIS — M6281 Muscle weakness (generalized): Secondary | ICD-10-CM

## 2016-08-21 DIAGNOSIS — R278 Other lack of coordination: Secondary | ICD-10-CM

## 2016-08-21 DIAGNOSIS — M25612 Stiffness of left shoulder, not elsewhere classified: Secondary | ICD-10-CM

## 2016-08-21 DIAGNOSIS — I69351 Hemiplegia and hemiparesis following cerebral infarction affecting right dominant side: Secondary | ICD-10-CM

## 2016-08-21 DIAGNOSIS — R2681 Unsteadiness on feet: Secondary | ICD-10-CM

## 2016-08-21 NOTE — Therapy (Signed)
[facility] [address] [place], [place], [address] Phone: [phone]   Fax:  [phone]  Occupational Therapy Treatment  Patient Details  Name: Melanie Hendricks MRN: [id number] Date of Birth: April 29, 1936 Referring Provider: Dr. Alysia Penna  Encounter Date: 08/21/2016      OT End of Session - 08/21/16 1051    Visit Number 11   Number of Visits 17   Date for OT Re-Evaluation 09/11/16   Authorization Type [employer] Medicare, no visit limit/auth; needs G-code   Authorization - Visit Number 11   Authorization - Number of Visits 18  G-code perfomed on visit 8   OT Start Time 0803   OT Stop Time 0845   OT Time Calculation (min) 42 min   Activity Tolerance Patient tolerated treatment well   Behavior During Therapy [facility] for tasks assessed/performed      Past Medical History:  Diagnosis Date  . Allergy   . Fx humeral neck   . Hepatitis   . Hyperlipidemia   . Hypertension   . Skin melanoma ([place])   . Stroke [facility])     Past Surgical History:  Procedure Laterality Date  . Melenoma removal    . TONSILLECTOMY      There were no vitals filed for this visit.      Subjective Assessment - 08/21/16 0832    Pertinent History hx of fall; L humerus fx 05/14/16 (s/p 8 wks at time of eval)--WBAT, ok for ROM (Dr. Tania Ade following); monitored for skin CA hx; hearing loss (particularly R side)   Limitations fall risk; L humerus fx 05/14/16 (s/p 8 wks at time of eval)--WBAT, ok for ROM (Dr. Tania Ade following), no driving, hearing loss   Patient Stated Goals return to being active   Currently in Pain? (P)  Yes   Pain Score (P)  1    Pain Orientation (P)  Left   Pain Descriptors / Indicators (P)  Aching   Pain Type (P)  Acute pain   Pain Onset (P)  More than a month ago   Pain Frequency (P)  Intermittent           Treatment: Discussion with pt and dtr in law about use of and exercise flowsheet to  organize exercises. Pt was provided with a blank flow sheet and she and dtr in law are going to fill in. Reviewed red putty exercises for both hands for composite grip and tip pinch, 20 reps each. Arm bike x 6 mins level 1 for conditioning. Dynamic reaching in standing with bilateral UE's to place and remove graded clothespins, after retrieving from lower surface, with supervision. Cane exercises for shoulder flexion and abduction seated, min v.c                Balance Exercises - 08/21/16 0907      Balance Exercises: Standing   SLS with Vectors Foam/compliant surface;Other reps (comment);Limitations   Step Over Hurdles / Cones reciprocal stepping over hurdles on red mats with min assist/support on counter top as needed     Balance Exercises: Standing   SLS with Vectors Limitations 6 cones along edges of red mats:            OT Education - 08/21/16 1049    Education provided Yes   Education Details memory compensations, exercise fleowhet to organize exercises   Person(s) Educated Patient;Child(ren)   Methods Explanation;Demonstration;Verbal cues;Handout   Comprehension Verbalized understanding;Verbal cues required  OT Short Term Goals - 08/11/16 1000      OT SHORT TERM GOAL #1   Title Pt will be independent with initial HEP--check STGs 08/11/16   Baseline Pt demonstrated I'ly in clinic and verbalized.   Time 4   Period Weeks   Status Achieved     OT SHORT TERM GOAL #2   Title Pt will improve coordination for ADLs as shown by improving time on 9-hole peg test by 5sec bilaterally.   Baseline R-32.87sec, L-42.41sec; 08/11/16 R= 28.17 sec, L= 31.69 sec   Time 4   Period Weeks   Status Achieved     OT SHORT TERM GOAL #3   Title Pt will demo at least 100* L shoulder flex for functional reaching/ADLs with 3/10 pain or less.   Baseline 90* @ Eval; 08/11/16 = 125* painfree   Time 4   Period Weeks   Status Achieved     OT SHORT TERM GOAL #4   Title Pt will  improve L grip strength by at least 5lbs to assist with opening containers/ADLs.   Baseline 15lbs at Eval; 08/11/16 R= 48# vs L = 39#   Time 4   Period Weeks   Status Achieved     OT SHORT TERM GOAL #5   Title Pt will verbalize understanding of memory/cognitive compensation strategies for ADLs prn.   Time 4   Period Weeks   Status On-going     OT SHORT TERM GOAL #6   Title Pt will perform simple snack prep/home maintenance task mod I.   Time 4   Period Weeks   Status Achieved           OT Long Term Goals - 08/12/16 5681      OT LONG TERM GOAL #1   Title Pt will be independent with updated HEP.--check LTGs 09/11/16   Time 8   Period Weeks   Status New     OT LONG TERM GOAL #2   Title Pt will improve coordination for ADLs as shown by improving time on 9-hole peg test by 10sec with LUE.   Baseline 42.41sec   Time 8   Period Weeks   Status Achieved     OT LONG TERM GOAL #3   Title Pt will demo at least 110* L shoulder flex for functional reaching/ADLs with less than 2/10 pain.  Updated 08/12/16:  Pt will demo at least 135* L shoulder flex for functional reaching/ADLs with less than 2/10 pain.   Baseline 90*   Time 8   Period Weeks   Status Revised  08/12/16:  met initial goal and upgraded.       OT LONG TERM GOAL #4   Title Pt will improve L grip strength by at least 15lbs to assist with opening containers/ADLs.  Updated 08/12/16:  Pt will improve L grip strength to at least 42lbs to assist with opening containers/ADLs.   Baseline 15lbs   Time 8   Period Weeks   Status Revised  08/12/16  met initial goal and upgraded.     OT LONG TERM GOAL #5   Title Pt will demo at least 100* L shoulder abduction for lateral reaching/ADLs with less than 3/10 pain.   Baseline 90*   Time 8   Period Weeks   Status New     OT LONG TERM GOAL #6   Title Pt will perform simple cooking tasks/meal prep mod I.  08/12/16 updated:  Pt will perform mod complex cooking tasks/meal prep  mod I.    Time 8   Period Weeks   Status Revised  08/12/16  met initial goal and upgraded.       OT LONG TERM GOAL #7   Title Pt will use LUE as nondominant assist for ADLs/IADLs at least 85% of the time   Time 8   Period Weeks   Status Achieved  08/12/16 per pt report               Plan - 08/21/16 1048    Clinical Impression Statement Pt is progressing towards goals for increased strength and ROM in UE's.   Rehab Potential Good   OT Frequency 2x / week   OT Duration 8 weeks   OT Treatment/Interventions Self-care/ADL training;Cryotherapy;Parrafin;Therapeutic exercise;DME and/or AE instruction;Therapist, nutritional;Therapeutic activities;Patient/family education;Balance training;Cognitive remediation/compensation;Splinting;Manual Therapy;Neuromuscular education;Fluidtherapy;Ultrasound;Electrical Stimulation;Moist Heat;Energy conservation;Passive range of motion;Therapeutic exercises   Plan continue to work towards unmet goals   OT Home Exercise Plan Education provided:  Reviewed HEP for LUE ROM from hospital (pt to continue), coordination HEP upgraded putty to red; yellow theraband HEP (shoulder abduction, ext, flex, horizontal abduction)    Consulted and Agree with Plan of Care Patient   Family Member Consulted dtr-in-law Jarrett Soho)      Patient will benefit from skilled therapeutic intervention in order to improve the following deficits and impairments:  Decreased coordination, Decreased knowledge of use of DME, Decreased strength, Impaired UE functional use, Decreased activity tolerance, Decreased range of motion, Pain, Decreased cognition, Decreased balance, Decreased mobility  Visit Diagnosis: Other abnormalities of gait and mobility  Hemiplegia and hemiparesis following cerebral infarction affecting right dominant side (HCC)  Muscle weakness (generalized)  Other lack of coordination  Acute pain of left shoulder  Stiffness of left shoulder, not elsewhere  classified    Problem List Patient Active Problem List   Diagnosis Date Noted  . 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 Gait disturbance, post-stroke 07/21/2016  . Ankle contracture, right 07/21/2016  . Post-operative pain   . Benign essential HTN   . Urinary retention   . Closed fracture of left proximal humerus   . Left pontine cerebrovascular accident ([place]) 06/26/2016  . Stroke (cerebrum) ([place]) 06/23/2016  . Blood pressure elevated without history of HTN 06/23/2016  . Left pontine stroke ([place]) 06/23/2016  . Stroke-like symptoms   . Eye movement disorder   . Closed fracture of neck of humerus with routine healing     Naevia Unterreiner 08/21/2016, 10:53 AM  [facility] [address] [place], [place], [address] Phone: [phone]   Fax:  [phone]  Name: Melanie Hendricks MRN: [id number] Date of Birth: [birth year]

## 2016-08-21 NOTE — Patient Instructions (Addendum)
(  Exercise) Monday Tuesday Wednesday Thursday Friday Saturday Sunday                                                                                                           Memory Compensation Strategies  1. Use "WARM" strategy.  W= write it down  A= associate it  R= repeat it  M= make a mental note  2.   You can keep a Social worker.  Use a 3-ring notebook with sections for the following: calendar, important names and phone numbers,  medications, doctors' names/phone numbers, lists/reminders, and a section to journal what you did  each day.   3.    Use a calendar to write appointments down.  4.    Write yourself a schedule for the day.  This can be placed on the calendar or in a separate section of the Memory Notebook.  Keeping a  regular schedule can help memory.  5.    Use medication organizer with sections for each day or morning/evening pills.  You may need help loading it  6.    Keep a basket, or pegboard by the door.  Place items that you need to take out with you in the basket or on the pegboard.  You may also want to  include a message board for reminders.  7.    Use sticky notes.  Place sticky notes with reminders in a place where the task is performed.  For example: " turn off the  stove" placed by the stove, "lock the door" placed on the door at eye level, " take your medications" on  the bathroom mirror or by the place where you normally take your medications.  8.    Use alarms/timers.  Use while cooking to remind yourself to check on food or as a reminder to take your medicine, or as a  reminder to make a call, or as a reminder to perform another task, etc.

## 2016-08-23 NOTE — Therapy (Signed)
[facility] [address] [place], [place], [address] Phone: [phone]   Fax:  [phone]  Physical Therapy Treatment  Patient Details  Name: Melanie Hendricks MRN: [id number] Date of Birth: February 24, 1936 Referring Provider: Ella Bodo  Encounter Date: 08/21/2016   08/21/16 0848  PT Visits / Re-Eval  Visit Number 11  Number of Visits 16  Date for PT Re-Evaluation 09/14/16  Authorization  Authorization Type Medicare, G-Code every 10th visit  PT Time Calculation  PT Start Time 0846  PT Stop Time 0930  PT Time Calculation (min) 44 min  PT - End of Session  Equipment Utilized During Treatment Gait belt     Past Medical History:  Diagnosis Date  . Allergy   . Fx humeral neck   . Hepatitis   . Hyperlipidemia   . Hypertension   . Skin melanoma ([place])   . Stroke [facility])     Past Surgical History:  Procedure Laterality Date  . Melenoma removal    . TONSILLECTOMY      There were no vitals filed for this visit.     08/21/16 0847  Symptoms/Limitations  Subjective No new complaints. No falls or pain to report. Using cane only when family is home, otherwise continues to use walker to get around.  Patient is accompained by: Family member Melanie Hendricks- daughter in Sports coach)  Pertinent History Had CVA last month, completed inpiatient rehab last Friday  Limitations Standing;Walking;Other (comment);House hold activities  Patient Stated Goals To return to usual self, driving, gardening, mowing yard, laundry, etc  Pain Assessment  Currently in Pain? No/denies  Pain Score 0      08/21/16 0849  Transfers  Transfers Sit to Stand;Stand to Sit  Sit to Stand 5: Supervision;With upper extremity assist;From chair/3-in-1  Stand to Sit 5: Supervision;With upper extremity assist;To chair/3-in-1  Ambulation/Gait  Ambulation/Gait Yes  Ambulation/Gait Assistance 4: Min guard  Ambulation/Gait Assistance Details occasional cues on posture  and step length with gait, no LOB noted with gatit  Ambulation Distance (Feet) 450 Feet (x1)  Assistive device None  Gait Pattern Step-through pattern;Decreased arm swing - left;Decreased stride length  Ambulation Surface Level;Indoor  High Level Balance  High Level Balance Activities Side stepping;Marching forwards;Marching backwards;Tandem walking (tandem fwd/bwd, toe walk fwd/bwd, heel walk fwd/bwd)  High Level Balance Comments on red mats next to counter top with single to no UE support for balance, 3 laps each way with min guard to min assist for balance.       08/21/16 0907  Balance Exercises: Standing  SLS with Vectors Foam/compliant surface;Other reps (comment);Limitations  Step Over Hurdles / Cones reciprocal stepping over hurdles on red mats with min assist/support on counter top as needed, cues on weight shifting and ex form/technique.   Balance Exercises: Standing  SLS with Vectors Limitations 6 cones along edges of red mats: alternating toe taps to each with side stepping left<>right, alternating double toe tap to each with side stepping left<>right and alternating flipping over/up cones to each one with side stepping left<>right. performed each one 1 lap each in each direction with min guard to min assist for balance with cues on ex form and technique.           PT Short Term Goals - 08/18/16 1121      PT SHORT TERM GOAL #1   Title Patient will demonstrate decreased fall risk with Berg balance score 45/56 or greater. (all STGs due 08/10/2016)   Baseline 08/11/2016 Scored 46/56 today  Status Achieved     PT SHORT TERM GOAL #2   Title Patient to complete TUG and set goal as appropriate for fall risk reduction.   Baseline Tug performed today, completed in 17.49 sec. Was previously performed on 11/16 in 16.87 sec. No goal has been set   Status On-going     PT [place] #3   Title Patient to verbalize CVA warning signs and risk factors with modifications for  prevention.    Baseline Patient provided with stroke prevention handout today.   Status On-going     PT SHORT TERM GOAL #4   Title Patient to ambulate indoor surfaces with cane and S over 300' no LOB.    Baseline 08/11/16 Met Patient ambulated 345 with S today   Status Achieved           PT Long Term Goals - 08/07/16 1140      PT LONG TERM GOAL #1   Title Patient to demonstrate decreased fall risk with Gait velocity >0.8 m/s (all LTGs due 09/04/16)   Time 8   Period Weeks   Status New     PT LONG TERM GOAL #2   Title Patient to be independent with HEP for balance and strength and walking program.   Time 8   Period Weeks   Status New     PT LONG TERM GOAL #3   Title Patient to ambulate indoors without device independent 300' no LOB   Time 8   Period Weeks   Status New     PT LONG TERM GOAL #4   Title Patient to ambulate outdoors with cane and no LOB 500' paved and grassy surfaces.    Time 8   Period Weeks   Status New        08/21/16 0848  Plan  Clinical Impression Statement Todays skilled session continued to address gait with no AD and high level balance with no issues reported. Pt is making steady progress toward goals and should benefit from continued PT to progress toward unmet goals.  Pt will benefit from skilled therapeutic intervention in order to improve on the following deficits Abnormal gait;Impaired UE functional use;Decreased safety awareness;Decreased endurance;Decreased activity tolerance;Decreased balance;Decreased mobility;Decreased strength;Decreased knowledge of use of DME;Pain;Decreased cognition  Rehab Potential Good  PT Frequency 2x / week  PT Duration 8 weeks  PT Treatment/Interventions ADLs/Self Care Home Management;Functional mobility training;Stair training;Gait training;DME Instruction;Therapeutic activities;Therapeutic exercise;Neuromuscular re-education;Patient/family education;Balance training;Vestibular  PT Next Visit Plan gait train  with/without SPC - cont high level balance/gait activities  Consulted and Agree with Plan of Care Patient;Family member/caregiver  Family Member Consulted daughter in law - Melanie Hendricks      Patient will benefit from skilled therapeutic intervention in order to improve the following deficits and impairments:  Abnormal gait, Impaired UE functional use, Decreased safety awareness, Decreased endurance, Decreased activity tolerance, Decreased balance, Decreased mobility, Decreased strength, Decreased knowledge of use of DME, Pain, Decreased cognition  Visit Diagnosis: Other abnormalities of gait and mobility  Hemiplegia and hemiparesis following cerebral infarction affecting right dominant side (HCC)  Unsteadiness on feet     Problem List Patient Active Problem List   Diagnosis Date Noted  . 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 Gait disturbance, post-stroke 07/21/2016  . Ankle contracture, right 07/21/2016  . Post-operative pain   . Benign essential HTN   . Urinary retention   . Closed fracture of left proximal humerus   . Left pontine cerebrovascular accident ([place]) 06/26/2016  .  Stroke (cerebrum) ([place]) 06/23/2016  . Blood pressure elevated without history of HTN 06/23/2016  . Left pontine stroke ([place]) 06/23/2016  . Stroke-like symptoms   . Eye movement disorder   . Closed fracture of neck of humerus with routine healing    Willow Ora, PTA, [facility] [address] [place], [place] [address] [phone] 08/23/16, 8:22 PM  Name: Melanie Hendricks MRN: [id number] Date of Birth: [birth year]

## 2016-08-25 ENCOUNTER — Encounter: Payer: Self-pay | Admitting: Physical Therapy

## 2016-08-25 ENCOUNTER — Ambulatory Visit: Payer: Medicare Other | Admitting: Physical Therapy

## 2016-08-25 ENCOUNTER — Ambulatory Visit: Payer: Medicare Other | Admitting: Occupational Therapy

## 2016-08-25 ENCOUNTER — Encounter: Payer: Self-pay | Admitting: Occupational Therapy

## 2016-08-25 DIAGNOSIS — M6281 Muscle weakness (generalized): Secondary | ICD-10-CM

## 2016-08-25 DIAGNOSIS — R2681 Unsteadiness on feet: Secondary | ICD-10-CM

## 2016-08-25 DIAGNOSIS — M25512 Pain in left shoulder: Secondary | ICD-10-CM

## 2016-08-25 DIAGNOSIS — R278 Other lack of coordination: Secondary | ICD-10-CM

## 2016-08-25 DIAGNOSIS — R2689 Other abnormalities of gait and mobility: Secondary | ICD-10-CM | POA: Diagnosis not present

## 2016-08-25 DIAGNOSIS — I69351 Hemiplegia and hemiparesis following cerebral infarction affecting right dominant side: Secondary | ICD-10-CM

## 2016-08-25 DIAGNOSIS — M25612 Stiffness of left shoulder, not elsewhere classified: Secondary | ICD-10-CM

## 2016-08-25 NOTE — Therapy (Signed)
[facility] [address] [place], [place], [address] Phone: [phone]   Fax:  [phone]  Physical Therapy Treatment  Patient Details  Name: Melanie Hendricks MRN: [id number] Date of Birth: 06/01/1936 Referring Provider: Ella Bodo  Encounter Date: 08/25/2016      PT End of Session - 08/25/16 0809    Visit Number 12   Number of Visits 16   Date for PT Re-Evaluation 09/14/16   Authorization Type Medicare, G-Code every 10th visit   PT Start Time 0805   PT Stop Time 0845   PT Time Calculation (min) 40 min   Equipment Utilized During Treatment Gait belt   Activity Tolerance Patient tolerated treatment well   Behavior During Therapy [facility] for tasks assessed/performed      Past Medical History:  Diagnosis Date  . Allergy   . Fx humeral neck   . Hepatitis   . Hyperlipidemia   . Hypertension   . Skin melanoma ([place])   . Stroke [facility])     Past Surgical History:  Procedure Laterality Date  . Melenoma removal    . TONSILLECTOMY      There were no vitals filed for this visit.      Subjective Assessment - 08/25/16 0807    Subjective No new complaints. No falls or pain to report. Walking without AD in home except with showers and bathroom at night, still using walker.   Patient is accompained by: Family member  Jarrett Soho- daughter in law   Pertinent History Had CVA last month, completed inpiatient rehab last Friday   Limitations Standing;Walking;Other (comment);House hold activities   Patient Stated Goals To return to usual self, driving, gardening, mowing yard, laundry, etc   Currently in Pain? No/denies   Pain Score 0-No pain             [facility] Adult PT Treatment/Exercise - 08/25/16 0810      Transfers   Transfers Sit to Stand;Stand to Sit   Sit to Stand 5: Supervision;With upper extremity assist;From chair/3-in-1   Stand to Sit 5: Supervision;With upper extremity assist;To chair/3-in-1      Ambulation/Gait   Ambulation/Gait Yes   Ambulation/Gait Assistance 5: Supervision   Ambulation/Gait Assistance Details cues for increased step length and for increased arm swing with gait   Ambulation Distance (Feet) 450 Feet   Assistive device None   Gait Pattern Step-through pattern;Decreased arm swing - left;Decreased stride length   Ambulation Surface Level;Indoor   Gait Comments additional gait: along ~50 foot hallway- forward gait with head movements up<>down and left<>right x 4 laps each with min guard assist for balance. decreased gait speed noted with some veering noted with head movements left<>right only.                          High Level Balance   High Level Balance Activities Tandem walking;Marching forwards;Marching backwards  tandem fwd/bwd, toe fwd/bwd, heel fwd/bwd   High Level Balance Comments on red mats next to counter top with single to no UE support for balance, 3 laps each way with min guard to min assist for balance.              Balance Exercises - 08/25/16 0830      Balance Exercises: Standing   Standing Eyes Opened Wide (BOA);Foam/compliant surface;Head turns   Standing Eyes Closed Wide (BOA);Foam/compliant surface;Head turns;Other reps (comment);20 secs;Limitations   SLS with Vectors Solid surface;Other reps (comment);Limitations  Balance Exercises: Standing   Standing Eyes Closed Limitations blue mat over ramp, performed both facing up and facing down ramp: wide base of support with EC no head movements, progressing toward EO head movements up<>down and left<>right, progressing to EC head movements up<>down and left<>right, min to mod assist for balance and cues on posture/weight shifting to assist with balance.                      SLS with Vectors Limitations foam bubbles in semi circle on floor: toe taps to each one x 5 laps with each LE, min to mod assist for balance with no UE support. cues on posture and weight shifting to assist with balance.                              PT Short Term Goals - 08/18/16 1121      PT SHORT TERM GOAL #1   Title Patient will demonstrate decreased fall risk with Berg balance score 45/56 or greater. (all STGs due 08/10/2016)   Baseline 08/11/2016 Scored 46/56 today   Status Achieved     PT SHORT TERM GOAL #2   Title Patient to complete TUG and set goal as appropriate for fall risk reduction.   Baseline Tug performed today, completed in 17.49 sec. Was previously performed on 11/16 in 16.87 sec. No goal has been set   Status On-going     PT [place] #3   Title Patient to verbalize CVA warning signs and risk factors with modifications for prevention.    Baseline Patient provided with stroke prevention handout today.   Status On-going     PT SHORT TERM GOAL #4   Title Patient to ambulate indoor surfaces with cane and S over 300' no LOB.    Baseline 08/11/16 Met Patient ambulated 345 with S today   Status Achieved           PT Long Term Goals - 08/07/16 1140      PT LONG TERM GOAL #1   Title Patient to demonstrate decreased fall risk with Gait velocity >0.8 m/s (all LTGs due 09/04/16)   Time 8   Period Weeks   Status New     PT LONG TERM GOAL #2   Title Patient to be independent with HEP for balance and strength and walking program.   Time 8   Period Weeks   Status New     PT LONG TERM GOAL #3   Title Patient to ambulate indoors without device independent 300' no LOB   Time 8   Period Weeks   Status New     PT LONG TERM GOAL #4   Title Patient to ambulate outdoors with cane and no LOB 500' paved and grassy surfaces.    Time 8   Period Weeks   Status New             Plan - 08/25/16 0809    Clinical Impression Statement Today's skilled session continued to focus on balance and gait with no AD. Pt is making steady progress toward goals and should benefit from continued PT to progress toward unmet goals   Rehab Potential Good   PT Frequency 2x / week   PT  Duration 8 weeks   PT Treatment/Interventions ADLs/Self Care Home Management;Functional mobility training;Stair training;Gait training;DME Instruction;Therapeutic activities;Therapeutic exercise;Neuromuscular re-education;Patient/family education;Balance training;Vestibular   PT Next Visit Plan gait train with/without  SPC - cont high level balance/gait activities   Consulted and Agree with Plan of Care Patient;Family member/caregiver   Family Member Consulted daughter in law - Jarrett Soho      Patient will benefit from skilled therapeutic intervention in order to improve the following deficits and impairments:  Abnormal gait, Impaired UE functional use, Decreased safety awareness, Decreased endurance, Decreased activity tolerance, Decreased balance, Decreased mobility, Decreased strength, Decreased knowledge of use of DME, Pain, Decreased cognition  Visit Diagnosis: Hemiplegia and hemiparesis following cerebral infarction affecting right dominant side (HCC)  Unsteadiness on feet  Muscle weakness (generalized)  Other abnormalities of gait and mobility     Problem List Patient Active Problem List   Diagnosis Date Noted  . 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 Gait disturbance, post-stroke 07/21/2016  . Ankle contracture, right 07/21/2016  . Post-operative pain   . Benign essential HTN   . Urinary retention   . Closed fracture of left proximal humerus   . Left pontine cerebrovascular accident ([place]) 06/26/2016  . Stroke (cerebrum) ([place]) 06/23/2016  . Blood pressure elevated without history of HTN 06/23/2016  . Left pontine stroke ([place]) 06/23/2016  . Stroke-like symptoms   . Eye movement disorder   . Closed fracture of neck of humerus with routine healing     Willow Ora, PTA, [facility] [address], [place], [place] [address] [phone] 08/25/16, 8:16 PM  Name: JORDYN HOFACKER MRN: [id number] Date of Birth: [birth year]

## 2016-08-25 NOTE — Therapy (Signed)
[facility] [address] [place], [place], [address] Phone: [phone]   Fax:  [phone]  Occupational Therapy Treatment  Patient Details  Name: Melanie Hendricks MRN: [id number] Date of Birth: [birth year] Referring Provider: Dr. Alysia Penna  Encounter Date: 08/25/2016      OT End of Session - 08/25/16 0936    Visit Number 12   Number of Visits 17   Date for OT Re-Evaluation 09/11/16   Authorization Type [employer] Medicare, no visit limit/auth; needs G-code   Authorization - Visit Number 12   Authorization - Number of Visits 18   OT Start Time 0847   OT Stop Time 0930   OT Time Calculation (min) 43 min   Activity Tolerance Patient tolerated treatment well   Behavior During Therapy [facility] for tasks assessed/performed      Past Medical History:  Diagnosis Date  . Allergy   . Fx humeral neck   . Hepatitis   . Hyperlipidemia   . Hypertension   . Skin melanoma ([place])   . Stroke [facility])     Past Surgical History:  Procedure Laterality Date  . Melenoma removal    . TONSILLECTOMY      There were no vitals filed for this visit.      Subjective Assessment - 08/25/16 0929    Subjective  Patient reports that she is preparing simple meals at home without assistance - eg fried eggs, sandwhich, etc   Patient is accompained by: Family member   Pertinent History hx of fall; L humerus fx 05/14/16 (s/p 8 wks at time of eval)--WBAT, ok for ROM (Dr. Tania Ade following); monitored for skin CA hx; hearing loss (particularly R side)   Limitations fall risk; L humerus fx 05/14/16 (s/p 8 wks at time of eval)--WBAT, ok for ROM (Dr. Tania Ade following), no driving, hearing loss   Patient Stated Goals return to being active   Currently in Pain? No/denies   Pain Score 0-No pain                      OT Treatments/Exercises (OP) - 08/25/16 0001      Shoulder Exercises: Supine   ABduction  PROM;AAROM;AROM;Left   ABduction Limitations With emphasis on head of humerus dropping into glenohumeral joint.  Patient initially with pain due to guarding muscle tension - with active relaxation improvemd range and decreased apin.  0-120 supine   Other Supine Exercises Shoulder flexion / extension in supine.  Patient very guarded - worked within agreed pain goal of no higher than 3/10.  Patient with discomfort returning to resting position (extension) Worked in mid range to improve ability to tolerate directional changes.       Shoulder Exercises: Seated   Other Seated Exercises Followed supine exercises with seated motion flexion / abduction left UE.   At end of session, patient with 120 degrees of shoulder flexion, and 110 degrees of shoulder abduction - reviewed goals, and explained to patient that she would need to repeat these ranges to consider goals met.               Balance Exercises - 08/25/16 0830      Balance Exercises: Standing   Standing Eyes Opened Wide (BOA);Foam/compliant surface;Head turns   Standing Eyes Closed Wide (BOA);Foam/compliant surface;Head turns;Other reps (comment);20 secs;Limitations   SLS with Vectors Solid surface;Other reps (comment);Limitations     Balance Exercises: Standing   Standing Eyes Closed Limitations blue mat over  ramp, performed both facing up and facing down ramp:   SLS with Vectors Limitations foam bubbles in semi circle on floor:            OT Education - 08/25/16 0936    Education provided Yes   Education Details mecahnics of mid to high reach in left shoulder - scapula depression   Person(s) Educated Patient;Child(ren)   Methods Explanation;Demonstration;Tactile cues;Verbal cues   Comprehension Verbalized understanding;Returned demonstration;Need further instruction          OT Short Term Goals - 08/11/16 1000      OT SHORT TERM GOAL #1   Title Pt will be independent with initial HEP--check STGs 08/11/16   Baseline Pt  demonstrated I'ly in clinic and verbalized.   Time 4   Period Weeks   Status Achieved     OT SHORT TERM GOAL #2   Title Pt will improve coordination for ADLs as shown by improving time on 9-hole peg test by 5sec bilaterally.   Baseline R-32.87sec, L-42.41sec; 08/11/16 R= 28.17 sec, L= 31.69 sec   Time 4   Period Weeks   Status Achieved     OT SHORT TERM GOAL #3   Title Pt will demo at least 100* L shoulder flex for functional reaching/ADLs with 3/10 pain or less.   Baseline 90* @ Eval; 08/11/16 = 125* painfree   Time 4   Period Weeks   Status Achieved     OT SHORT TERM GOAL #4   Title Pt will improve L grip strength by at least 5lbs to assist with opening containers/ADLs.   Baseline 15lbs at Eval; 08/11/16 R= 48# vs L = 39#   Time 4   Period Weeks   Status Achieved     OT SHORT TERM GOAL #5   Title Pt will verbalize understanding of memory/cognitive compensation strategies for ADLs prn.   Time 4   Period Weeks   Status On-going     OT SHORT TERM GOAL #6   Title Pt will perform simple snack prep/home maintenance task mod I.   Time 4   Period Weeks   Status Achieved           OT Long Term Goals - 08/12/16 9163      OT LONG TERM GOAL #1   Title Pt will be independent with updated HEP.--check LTGs 09/11/16   Time 8   Period Weeks   Status New     OT LONG TERM GOAL #2   Title Pt will improve coordination for ADLs as shown by improving time on 9-hole peg test by 10sec with LUE.   Baseline 42.41sec   Time 8   Period Weeks   Status Achieved     OT LONG TERM GOAL #3   Title Pt will demo at least 110* L shoulder flex for functional reaching/ADLs with less than 2/10 pain.  Updated 08/12/16:  Pt will demo at least 135* L shoulder flex for functional reaching/ADLs with less than 2/10 pain.   Baseline 90*   Time 8   Period Weeks   Status Revised  08/12/16:  met initial goal and upgraded.       OT LONG TERM GOAL #4   Title Pt will improve L grip strength by at least  15lbs to assist with opening containers/ADLs.  Updated 08/12/16:  Pt will improve L grip strength to at least 42lbs to assist with opening containers/ADLs.   Baseline 15lbs   Time 8   Period Weeks  Status Revised  08/12/16  met initial goal and upgraded.     OT LONG TERM GOAL #5   Title Pt will demo at least 100* L shoulder abduction for lateral reaching/ADLs with less than 3/10 pain.   Baseline 90*   Time 8   Period Weeks   Status New     OT LONG TERM GOAL #6   Title Pt will perform simple cooking tasks/meal prep mod I.  08/12/16 updated:  Pt will perform mod complex cooking tasks/meal prep mod I.   Time 8   Period Weeks   Status Revised  08/12/16  met initial goal and upgraded.       OT LONG TERM GOAL #7   Title Pt will use LUE as nondominant assist for ADLs/IADLs at least 85% of the time   Time 8   Period Weeks   Status Achieved  08/12/16 per pt report               Plan - 08/25/16 0937    Clinical Impression Statement Patient is progressing toward updated goals.     Rehab Potential Good   OT Frequency 2x / week   OT Duration 8 weeks   OT Treatment/Interventions Self-care/ADL training;Cryotherapy;Parrafin;Therapeutic exercise;DME and/or AE instruction;Therapist, nutritional;Therapeutic activities;Patient/family education;Balance training;Cognitive remediation/compensation;Splinting;Manual Therapy;Neuromuscular education;Fluidtherapy;Ultrasound;Electrical Stimulation;Moist Heat;Energy conservation;Passive range of motion;Therapeutic exercises   Plan left shoulder range of motion flex/abd, left hand strength, remaining goals   Consulted and Agree with Plan of Care Patient   Family Member Consulted dtr-in-law Jarrett Soho)      Patient will benefit from skilled therapeutic intervention in order to improve the following deficits and impairments:  Decreased coordination, Decreased knowledge of use of DME, Decreased strength, Impaired UE functional use, Decreased activity  tolerance, Decreased range of motion, Pain, Decreased cognition, Decreased balance, Decreased mobility  Visit Diagnosis: Hemiplegia and hemiparesis following cerebral infarction affecting right dominant side (HCC)  Unsteadiness on feet  Muscle weakness (generalized)  Other lack of coordination  Acute pain of left shoulder  Stiffness of left shoulder, not elsewhere classified    Problem List Patient Active Problem List   Diagnosis Date Noted  . 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 Gait disturbance, post-stroke 07/21/2016  . Ankle contracture, right 07/21/2016  . Post-operative pain   . Benign essential HTN   . Urinary retention   . Closed fracture of left proximal humerus   . Left pontine cerebrovascular accident ([place]) 06/26/2016  . Stroke (cerebrum) ([place]) 06/23/2016  . Blood pressure elevated without history of HTN 06/23/2016  . Left pontine stroke ([place]) 06/23/2016  . Stroke-like symptoms   . Eye movement disorder   . Closed fracture of neck of humerus with routine healing     Mariah Milling, OTR/L 08/25/2016, 9:39 AM  [facility] [address] [place], [place], [address] Phone: [phone]   Fax:  [phone]  Name: Melanie Hendricks MRN: [id number] Date of Birth: [birth year]

## 2016-08-27 ENCOUNTER — Ambulatory Visit: Payer: Medicare Other | Admitting: Physical Therapy

## 2016-08-27 ENCOUNTER — Ambulatory Visit: Payer: Medicare Other | Admitting: Occupational Therapy

## 2016-08-27 DIAGNOSIS — M25612 Stiffness of left shoulder, not elsewhere classified: Secondary | ICD-10-CM

## 2016-08-27 DIAGNOSIS — R278 Other lack of coordination: Secondary | ICD-10-CM

## 2016-08-27 DIAGNOSIS — M25512 Pain in left shoulder: Secondary | ICD-10-CM

## 2016-08-27 DIAGNOSIS — R2681 Unsteadiness on feet: Secondary | ICD-10-CM

## 2016-08-27 DIAGNOSIS — I69351 Hemiplegia and hemiparesis following cerebral infarction affecting right dominant side: Secondary | ICD-10-CM

## 2016-08-27 DIAGNOSIS — I69318 Other symptoms and signs involving cognitive functions following cerebral infarction: Secondary | ICD-10-CM

## 2016-08-27 DIAGNOSIS — R2689 Other abnormalities of gait and mobility: Secondary | ICD-10-CM

## 2016-08-27 DIAGNOSIS — M6281 Muscle weakness (generalized): Secondary | ICD-10-CM

## 2016-08-27 NOTE — Therapy (Signed)
[facility] [address] [place], [place], [address] Phone: [phone]   Fax:  [phone]  Occupational Therapy Treatment  Patient Details  Name: Melanie Hendricks MRN: [id number] Date of Birth: 02-Apr-1936 Referring Provider: Dr. Alysia Penna  Encounter Date: 08/27/2016      OT End of Session - 08/27/16 0853    Visit Number 13   Number of Visits 17   Date for OT Re-Evaluation 09/11/16   Authorization Type [employer] Medicare, no visit limit/auth; needs G-code   Authorization - Visit Number 13   Authorization - Number of Visits 18   OT Start Time 0849   OT Stop Time 0930   OT Time Calculation (min) 41 min   Activity Tolerance Patient tolerated treatment well   Behavior During Therapy [facility] for tasks assessed/performed      Past Medical History:  Diagnosis Date  . Allergy   . Fx humeral neck   . Hepatitis   . Hyperlipidemia   . Hypertension   . Skin melanoma ([place])   . Stroke [facility])     Past Surgical History:  Procedure Laterality Date  . Melenoma removal    . TONSILLECTOMY      There were no vitals filed for this visit.      Subjective Assessment - 08/27/16 0852    Subjective  Pt reports that she put a roast in the oven   Patient is accompained by: Family member   Pertinent History hx of fall; L humerus fx 05/14/16 (s/p 8 wks at time of eval)--WBAT, ok for ROM (Dr. Tania Ade following); monitored for skin CA hx; hearing loss (particularly R side)   Limitations fall risk; L humerus fx 05/14/16 (s/p 8 wks at time of eval)--WBAT, ok for ROM (Dr. Tania Ade following), no driving, hearing loss   Patient Stated Goals return to being active   Currently in Pain? No/denies       Cane ex for shoulder flex, abduction, ER, IR x15 each with min facilitation for L scapula, and min cueing for compensation    Arm bike x 6 min level 1 for conditioning/reciprocal movement (forward/backwards).    In supine,  shoulder flex, chest press, ER, abduction with 1lb wt. With LUE x10 each with intermittent min facilitation for control.  In sitting, functional reaching with LUE to place small pegs in vertical pegboard to copy design x approx 36mn without rest and min cues for compensation for incr activity tolerance, coordination.                       OT Education - 08/27/16 1126    Education Details Return to driving based on pt questions--pt will need clearance by MD, and how to slowly progress driving to ensure reaction time (parking lot>quiet residential road>busier road with supervision)   Person(s) Educated Patient;Child(ren)   Methods Explanation   Comprehension Verbalized understanding          OT Short Term Goals - 08/27/16 0926      OT SHORT TERM GOAL #1   Title Pt will be independent with initial HEP--check STGs 08/11/16   Baseline Pt demonstrated I'ly in clinic and verbalized.   Time 4   Period Weeks   Status Achieved     OT SHORT TERM GOAL #2   Title Pt will improve coordination for ADLs as shown by improving time on 9-hole peg test by 5sec bilaterally.   Baseline R-32.87sec, L-42.41sec; 08/11/16 R= 28.17 sec,  L= 31.69 sec   Time 4   Period Weeks   Status Achieved     OT SHORT TERM GOAL #3   Title Pt will demo at least 100* L shoulder flex for functional reaching/ADLs with 3/10 pain or less.   Baseline 90* @ Eval; 08/11/16 = 125* painfree   Time 4   Period Weeks   Status Achieved     OT SHORT TERM GOAL #4   Title Pt will improve L grip strength by at least 5lbs to assist with opening containers/ADLs.   Baseline 15lbs at Eval; 08/11/16 R= 48# vs L = 39#   Time 4   Period Weeks   Status Achieved     OT SHORT TERM GOAL #5   Title Pt will verbalize understanding of memory/cognitive compensation strategies for ADLs prn.   Time 4   Period Weeks   Status Achieved     OT SHORT TERM GOAL #6   Title Pt will perform simple snack prep/home maintenance task mod I.    Time 4   Period Weeks   Status Achieved           OT Long Term Goals - 08/12/16 8786      OT LONG TERM GOAL #1   Title Pt will be independent with updated HEP.--check LTGs 09/11/16   Time 8   Period Weeks   Status New     OT LONG TERM GOAL #2   Title Pt will improve coordination for ADLs as shown by improving time on 9-hole peg test by 10sec with LUE.   Baseline 42.41sec   Time 8   Period Weeks   Status Achieved     OT LONG TERM GOAL #3   Title Pt will demo at least 110* L shoulder flex for functional reaching/ADLs with less than 2/10 pain.  Updated 08/12/16:  Pt will demo at least 135* L shoulder flex for functional reaching/ADLs with less than 2/10 pain.   Baseline 90*   Time 8   Period Weeks   Status Revised  08/12/16:  met initial goal and upgraded.       OT LONG TERM GOAL #4   Title Pt will improve L grip strength by at least 15lbs to assist with opening containers/ADLs.  Updated 08/12/16:  Pt will improve L grip strength to at least 42lbs to assist with opening containers/ADLs.   Baseline 15lbs   Time 8   Period Weeks   Status Revised  08/12/16  met initial goal and upgraded.     OT LONG TERM GOAL #5   Title Pt will demo at least 100* L shoulder abduction for lateral reaching/ADLs with less than 3/10 pain.   Baseline 90*   Time 8   Period Weeks   Status New     OT LONG TERM GOAL #6   Title Pt will perform simple cooking tasks/meal prep mod I.  08/12/16 updated:  Pt will perform mod complex cooking tasks/meal prep mod I.   Time 8   Period Weeks   Status Revised  08/12/16  met initial goal and upgraded.       OT LONG TERM GOAL #7   Title Pt will use LUE as nondominant assist for ADLs/IADLs at least 85% of the time   Time 8   Period Weeks   Status Achieved  08/12/16 per pt report               Plan - 08/27/16 0915    Clinical  Impression Statement Pt is progressing towards goals with improving strength and activity tolerance with decr pain.    Rehab Potential Good   OT Frequency 2x / week   OT Duration 8 weeks   OT Treatment/Interventions Self-care/ADL training;Cryotherapy;Parrafin;Therapeutic exercise;DME and/or AE instruction;Therapist, nutritional;Therapeutic activities;Patient/family education;Balance training;Cognitive remediation/compensation;Splinting;Manual Therapy;Neuromuscular education;Fluidtherapy;Ultrasound;Electrical Stimulation;Moist Heat;Energy conservation;Passive range of motion;Therapeutic exercises   Plan L shoulder ROM (flexion/abduction), L hand strength   OT Home Exercise Plan Education provided:  Reviewed HEP for LUE ROM from hospital (pt to continue), coordination HEP upgraded putty to red; yellow theraband HEP (shoulder abduction, ext, flex, horizontal abduction)    Consulted and Agree with Plan of Care Patient   Family Member Consulted dtr-in-law Jarrett Soho)      Patient will benefit from skilled therapeutic intervention in order to improve the following deficits and impairments:  Decreased coordination, Decreased knowledge of use of DME, Decreased strength, Impaired UE functional use, Decreased activity tolerance, Decreased range of motion, Pain, Decreased cognition, Decreased balance, Decreased mobility  Visit Diagnosis: Muscle weakness (generalized)  Hemiplegia and hemiparesis following cerebral infarction affecting right dominant side (HCC)  Unsteadiness on feet  Stiffness of left shoulder, not elsewhere classified  Acute pain of left shoulder  Other lack of coordination  Other symptoms and signs involving cognitive functions following cerebral infarction    Problem List Patient Active Problem List   Diagnosis Date Noted  . 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 Gait disturbance, post-stroke 07/21/2016  . Ankle contracture, right 07/21/2016  . Post-operative pain   . Benign essential HTN   . Urinary retention   . Closed fracture of left proximal humerus   . Left pontine  cerebrovascular accident ([place]) 06/26/2016  . Stroke (cerebrum) ([place]) 06/23/2016  . Blood pressure elevated without history of HTN 06/23/2016  . Left pontine stroke ([place]) 06/23/2016  . Stroke-like symptoms   . Eye movement disorder   . Closed fracture of neck of humerus with routine healing     [facility] 08/27/2016, 11:34 AM  [facility] [address] [place], [place], [address] Phone: [phone]   Fax:  [phone]  Name: Melanie Hendricks MRN: [id number] Date of Birth: 01-Mar-1936   Vianne Bulls, OTR/L [facility] [address] [place],   [address] [phone] phone [phone] 08/27/16 11:34 AM

## 2016-08-27 NOTE — Therapy (Signed)
[facility] [address] [place], [place], [address] Phone: [phone]   Fax:  [phone]  Physical Therapy Treatment  Patient Details  Name: Melanie Hendricks MRN: [id number] Date of Birth: 12-05-1935 Referring Provider: Ella Bodo  Encounter Date: 08/27/2016      PT End of Session - 08/27/16 1419    Visit Number 13   Number of Visits 16   Date for PT Re-Evaluation 09/14/16   Authorization Type Medicare, G-Code every 10th visit   PT Start Time 0801   PT Stop Time 0846   PT Time Calculation (min) 45 min      Past Medical History:  Diagnosis Date  . Allergy   . Fx humeral neck   . Hepatitis   . Hyperlipidemia   . Hypertension   . Skin melanoma ([place])   . Stroke [facility])     Past Surgical History:  Procedure Laterality Date  . Melenoma removal    . TONSILLECTOMY      There were no vitals filed for this visit.      Subjective Assessment - 08/27/16 1408    Subjective Pt states she wishes she could walk with just the cane and not have to use the walker - "it would free me up alot"   Patient is accompained by: Family member   Pertinent History Had CVA last month, completed inpiatient rehab last Friday   Limitations Standing;Walking;Other (comment);House hold activities   Patient Stated Goals To return to usual self, driving, gardening, mowing yard, laundry, etc   Currently in Pain? No/denies                         [facility] Adult PT Treatment/Exercise - 08/27/16 1409      Transfers   Transfers Sit to Stand;Stand to Sit   Sit to Stand 5: Supervision;With upper extremity assist;From chair/3-in-1   Stand to Sit 5: Supervision   Number of Reps Other reps (comment)  5 reps   Comments 5 reps on blue Airex     Ambulation/Gait   Ambulation/Gait Yes   Ambulation/Gait Assistance 4: Min guard   Ambulation Distance (Feet) 500 Feet   Assistive device Straight cane   Gait Pattern Step-through  pattern   Ambulation Surface Unlevel;Outdoor;Paved   Stairs Yes   Stairs Assistance 5: Supervision   Stairs Assistance Details (indicate cue type and reason) cues for toes of stance leg to hang over edge of step   Stair Management Technique No rails;Alternating pattern;Forwards   Number of Stairs 4   Height of Stairs 6   Curb 5: Supervision   Curb Details (indicate cue type and reason) cues to let toes of stance leg hang over edge of step     High Level Balance   High Level Balance Activities Side stepping;Marching forwards;Backward walking  on blue mat     Knee/Hip Exercises: Standing   Heel Raises Both;1 set;10 reps    Step ups to 6" step forward, 10 reps each leg with UE support on hand rails  Pt performed cone taps standing on floor, cones on blue mats; tipping over and standing upright  Touching balance bubbles - alternating feet - crossing midline for increased difficulty      Balance Exercises - 08/27/16 1416      Balance Exercises: Standing   Standing Eyes Opened Foam/compliant surface;4 reps;Other (comment)  touching balance bubbles - straight ahead and crossovers;    SLS with Vectors Solid surface  Step Ups Forward;6 inch;UE support 2   Other Standing Exercises crossovers standing on blue mat - with bil. UE support             PT Short Term Goals - 08/18/16 1121      PT SHORT TERM GOAL #1   Title Patient will demonstrate decreased fall risk with Berg balance score 45/56 or greater. (all STGs due 08/10/2016)   Baseline 08/11/2016 Scored 46/56 today   Status Achieved     PT SHORT TERM GOAL #2   Title Patient to complete TUG and set goal as appropriate for fall risk reduction.   Baseline Tug performed today, completed in 17.49 sec. Was previously performed on 11/16 in 16.87 sec. No goal has been set   Status On-going     PT [place] #3   Title Patient to verbalize CVA warning signs and risk factors with modifications for prevention.    Baseline  Patient provided with stroke prevention handout today.   Status On-going     PT SHORT TERM GOAL #4   Title Patient to ambulate indoor surfaces with cane and S over 300' no LOB.    Baseline 08/11/16 Met Patient ambulated 345 with S today   Status Achieved           PT Long Term Goals - 08/07/16 1140      PT LONG TERM GOAL #1   Title Patient to demonstrate decreased fall risk with Gait velocity >0.8 m/s (all LTGs due 09/04/16)   Time 8   Period Weeks   Status New     PT LONG TERM GOAL #2   Title Patient to be independent with HEP for balance and strength and walking program.   Time 8   Period Weeks   Status New     PT LONG TERM GOAL #3   Title Patient to ambulate indoors without device independent 300' no LOB   Time 8   Period Weeks   Status New     PT LONG TERM GOAL #4   Title Patient to ambulate outdoors with cane and no LOB 500' paved and grassy surfaces.    Time 8   Period Weeks   Status New               Plan - 08/27/16 1421    Clinical Impression Statement Pt amb. safely with SPC outside on uneven paved surface, including inclines and declines. Informed pt and daugther-in-law that she is safe to amb. outside with cane without risk of fall, based on performance in today's session.   Rehab Potential Good   PT Frequency 2x / week   PT Duration 8 weeks   PT Treatment/Interventions ADLs/Self Care Home Management;Functional mobility training;Stair training;Gait training;DME Instruction;Therapeutic activities;Therapeutic exercise;Neuromuscular re-education;Patient/family education;Balance training;Vestibular   PT Next Visit Plan gait train with/without SPC - cont high level balance/gait activities   Consulted and Agree with Plan of Care Patient;Family member/caregiver   Family Member Consulted daughter in law - Jarrett Soho      Patient will benefit from skilled therapeutic intervention in order to improve the following deficits and impairments:  Abnormal gait,  Impaired UE functional use, Decreased safety awareness, Decreased endurance, Decreased activity tolerance, Decreased balance, Decreased mobility, Decreased strength, Decreased knowledge of use of DME, Pain, Decreased cognition  Visit Diagnosis: Unsteadiness on feet  Other abnormalities of gait and mobility  Muscle weakness (generalized)     Problem List Patient Active Problem List   Diagnosis Date Noted  .  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 Gait disturbance, post-stroke 07/21/2016  . Ankle contracture, right 07/21/2016  . Post-operative pain   . Benign essential HTN   . Urinary retention   . Closed fracture of left proximal humerus   . Left pontine cerebrovascular accident ([place]) 06/26/2016  . Stroke (cerebrum) ([place]) 06/23/2016  . Blood pressure elevated without history of HTN 06/23/2016  . Left pontine stroke ([place]) 06/23/2016  . Stroke-like symptoms   . Eye movement disorder   . Closed fracture of neck of humerus with routine healing     Judythe Postema, [place], PT 08/27/2016, 2:25 PM  [facility] [address] [place], [place], [address] Phone: [phone]   Fax:  [phone]  Name: Melanie Hendricks MRN: [id number] Date of Birth: Jun 18, 1936

## 2016-09-02 ENCOUNTER — Ambulatory Visit: Payer: Medicare Other | Admitting: Occupational Therapy

## 2016-09-02 ENCOUNTER — Ambulatory Visit: Payer: Medicare Other | Admitting: Physical Therapy

## 2016-09-03 ENCOUNTER — Ambulatory Visit: Payer: Medicare Other | Admitting: Physical Therapy

## 2016-09-03 ENCOUNTER — Ambulatory Visit: Payer: Medicare Other | Admitting: Occupational Therapy

## 2016-09-03 DIAGNOSIS — M6281 Muscle weakness (generalized): Secondary | ICD-10-CM

## 2016-09-03 DIAGNOSIS — R2689 Other abnormalities of gait and mobility: Secondary | ICD-10-CM

## 2016-09-03 DIAGNOSIS — R2681 Unsteadiness on feet: Secondary | ICD-10-CM

## 2016-09-03 DIAGNOSIS — R278 Other lack of coordination: Secondary | ICD-10-CM

## 2016-09-03 DIAGNOSIS — M25512 Pain in left shoulder: Secondary | ICD-10-CM

## 2016-09-03 DIAGNOSIS — M25612 Stiffness of left shoulder, not elsewhere classified: Secondary | ICD-10-CM

## 2016-09-03 NOTE — Therapy (Signed)
[facility] [address] [place], [place], [address] Phone: [phone]   Fax:  [phone]  Occupational Therapy Treatment  Patient Details  Name: Melanie Hendricks MRN: [id number] Date of Birth: [birth year] Referring Provider: Dr. Alysia Penna  Encounter Date: 09/03/2016      OT End of Session - 09/03/16 0804    Visit Number 14   Number of Visits 17   Date for OT Re-Evaluation 09/11/16   Authorization Type UHC Medicare, no visit limit/auth; needs G-code   Authorization - Visit Number 14   Authorization - Number of Visits 17   OT Start Time 0804   OT Stop Time 0845   OT Time Calculation (min) 41 min   Activity Tolerance Patient tolerated treatment well   Behavior During Therapy [facility] for tasks assessed/performed      Past Medical History:  Diagnosis Date  . Allergy   . Fx humeral neck   . Hepatitis   . Hyperlipidemia   . Hypertension   . Skin melanoma ([place])   . Stroke [facility])     Past Surgical History:  Procedure Laterality Date  . Melenoma removal    . TONSILLECTOMY      There were no vitals filed for this visit.      Subjective Assessment - 09/03/16 0810    Patient is accompained by: Family member   Pertinent History hx of fall; L humerus fx 05/14/16 (s/p 8 wks at time of eval)--WBAT, ok for ROM (Dr. Tania Ade following); monitored for skin CA hx; hearing loss (particularly R side)   Limitations fall risk; L humerus fx 05/14/16 (s/p 8 wks at time of eval)--WBAT, ok for ROM (Dr. Tania Ade following), no driving, hearing loss   Patient Stated Goals return to being active   Currently in Pain? No/denies        In supine, shoulder flex with PVC frame for stretch.  Followed by shoulder abduction cane ex for stretch with LUE.  ER AROM stretch in supine bilaterally.  Picking up blocks using gripper on level 2 for 1/2 blocks and then on level 1 for remaining for incr sustained grip  strength.  Checked shoulder abduction/flex and grip strength--see goal section for measurement.  Began discussing progress and renewal vs. D/c next week.  Pt/dtr would like to discuss and notify OT next session.                        OT Education - 09/03/16 1254    Education Details Updated HEP with 1lb wt. for L shoulder--see pt instructions   Person(s) Educated Patient;Child(ren)   Methods Explanation;Demonstration;Verbal cues;Handout   Comprehension Verbalized understanding;Returned demonstration;Verbal cues required          OT Short Term Goals - 08/27/16 0926      OT SHORT TERM GOAL #1   Title Pt will be independent with initial HEP--check STGs 08/11/16   Baseline Pt demonstrated I'ly in clinic and verbalized.   Time 4   Period Weeks   Status Achieved     OT SHORT TERM GOAL #2   Title Pt will improve coordination for ADLs as shown by improving time on 9-hole peg test by 5sec bilaterally.   Baseline R-32.87sec, L-42.41sec; 08/11/16 R= 28.17 sec, L= 31.69 sec   Time 4   Period Weeks   Status Achieved     OT SHORT TERM GOAL #3   Title Pt will demo at least 100* L shoulder  flex for functional reaching/ADLs with 3/10 pain or less.   Baseline 90* @ Eval; 08/11/16 = 125* painfree   Time 4   Period Weeks   Status Achieved     OT SHORT TERM GOAL #4   Title Pt will improve L grip strength by at least 5lbs to assist with opening containers/ADLs.   Baseline 15lbs at Eval; 08/11/16 R= 48# vs L = 39#   Time 4   Period Weeks   Status Achieved     OT SHORT TERM GOAL #5   Title Pt will verbalize understanding of memory/cognitive compensation strategies for ADLs prn.   Time 4   Period Weeks   Status Achieved     OT SHORT TERM GOAL #6   Title Pt will perform simple snack prep/home maintenance task mod I.   Time 4   Period Weeks   Status Achieved           OT Long Term Goals - 09/03/16 0835      OT LONG TERM GOAL #1   Title Pt will be independent  with updated HEP.--check LTGs 09/11/16   Time 8   Period Weeks   Status New     OT LONG TERM GOAL #2   Title Pt will improve coordination for ADLs as shown by improving time on 9-hole peg test by 10sec with LUE.   Baseline 42.41sec   Time 8   Period Weeks   Status Achieved     OT LONG TERM GOAL #3   Title Pt will demo at least 110* L shoulder flex for functional reaching/ADLs with less than 2/10 pain.  Updated 08/12/16:  Pt will demo at least 135* L shoulder flex for functional reaching/ADLs with less than 2/10 pain.   Baseline 90*   Time 8   Period Weeks   Status Revised  08/12/16:  met initial goal and upgraded.   09/03/16:  120*     OT LONG TERM GOAL #4   Title Pt will improve L grip strength by at least 15lbs to assist with opening containers/ADLs.  Updated 08/12/16:  Pt will improve L grip strength to at least 42lbs to assist with opening containers/ADLs.   Baseline 15lbs   Time 8   Period Weeks   Status Revised  08/12/16  met initial goal and upgraded.  09/03/16  34lbs     OT LONG TERM GOAL #5   Title Pt will demo at least 100* L shoulder abduction for lateral reaching/ADLs with less than 3/10 pain.   Baseline 90*   Time 8   Period Weeks   Status Achieved  09/03/16:  115*     OT LONG TERM GOAL #6   Title Pt will perform simple cooking tasks/meal prep mod I.  08/12/16 updated:  Pt will perform mod complex cooking tasks/meal prep mod I.   Time 8   Period Weeks   Status Revised  08/12/16  met initial goal and upgraded.       OT LONG TERM GOAL #7   Title Pt will use LUE as nondominant assist for ADLs/IADLs at least 85% of the time   Time 8   Period Weeks   Status Achieved  08/12/16 per pt report               Plan - 09/03/16 0809    Clinical Impression Statement Pt is progressing towards goals and met goal for shoulder abduction.     Rehab Potential Good   OT Frequency  2x / week   OT Duration 8 weeks   OT Treatment/Interventions Self-care/ADL  training;Cryotherapy;Parrafin;Therapeutic exercise;DME and/or AE instruction;Therapist, nutritional;Therapeutic activities;Patient/family education;Balance training;Cognitive remediation/compensation;Splinting;Manual Therapy;Neuromuscular education;Fluidtherapy;Ultrasound;Electrical Stimulation;Moist Heat;Energy conservation;Passive range of motion;Therapeutic exercises   Plan check LTGs next week, d/c vs. renewal, update putty HEP to green, review shoulder strengthening HEP   OT Home Exercise Plan Education provided:  Reviewed HEP for LUE ROM from hospital (pt to continue), coordination HEP upgraded putty to red; yellow theraband HEP (shoulder abduction, ext, flex, horizontal abduction); In shoulder flex, chest press, ER, horizontal abduction, ext with 1lb wt    Consulted and Agree with Plan of Care Patient;Family member/caregiver   Family Member Consulted dtr-in-law Jarrett Soho)      Patient will benefit from skilled therapeutic intervention in order to improve the following deficits and impairments:  Decreased coordination, Decreased knowledge of use of DME, Decreased strength, Impaired UE functional use, Decreased activity tolerance, Decreased range of motion, Pain, Decreased cognition, Decreased balance, Decreased mobility  Visit Diagnosis: Muscle weakness (generalized)  Stiffness of left shoulder, not elsewhere classified  Acute pain of left shoulder  Other lack of coordination    Problem List Patient Active Problem List   Diagnosis Date Noted  . 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 Gait disturbance, post-stroke 07/21/2016  . Ankle contracture, right 07/21/2016  . Post-operative pain   . Benign essential HTN   . Urinary retention   . Closed fracture of left proximal humerus   . Left pontine cerebrovascular accident ([place]) 06/26/2016  . Stroke (cerebrum) ([place]) 06/23/2016  . Blood pressure elevated without history of HTN 06/23/2016  . Left pontine stroke ([place]) 06/23/2016  .  Stroke-like symptoms   . Eye movement disorder   . Closed fracture of neck of humerus with routine healing     [facility] 09/03/2016, 12:57 PM  [facility] [address] [place], [place], [address] Phone: [phone]   Fax:  [phone]  Name: Melanie Hendricks MRN: [id number] Date of Birth: 1935/12/12   Vianne Bulls, OTR/L [facility] [address] [place], [place]  [phone] phone [phone] 09/03/16 12:57 PM

## 2016-09-03 NOTE — Therapy (Signed)
[facility] [address] [place], [place], [address] Phone: [phone]   Fax:  [phone]  Physical Therapy Treatment  Patient Details  Name: Melanie Hendricks MRN: [id number] Date of Birth: 1936/08/12 Referring Provider: Ella Bodo  Encounter Date: 09/03/2016      PT End of Session - 09/03/16 1351    Visit Number 14   Number of Visits 16   Date for PT Re-Evaluation 09/14/16   Authorization Type Medicare, G-Code every 10th visit   PT Start Time 0852   PT Stop Time 0935   PT Time Calculation (min) 43 min      Past Medical History:  Diagnosis Date  . Allergy   . Fx humeral neck   . Hepatitis   . Hyperlipidemia   . Hypertension   . Skin melanoma ([place])   . Stroke [facility])     Past Surgical History:  Procedure Laterality Date  . Melenoma removal    . TONSILLECTOMY      There were no vitals filed for this visit.      Subjective Assessment - 09/03/16 1346    Subjective Pt trying to decide about continuing OT and PT or just the PT to work on her balance - wants to be able to amb. without the cane and to be able to drive eventually   Patient is accompained by: Family member   Pertinent History Had CVA last month, completed inpiatient rehab last Friday   Limitations Standing;Walking;Other (comment);House hold activities   Patient Stated Goals To return to usual self, driving, gardening, mowing yard, laundry, etc   Currently in Pain? No/denies         Neuro Re-ed;  R SLS = 3.06 secs    L SLS = 6.57 secs Tandem stance - R foot in front - 27.38 secs                             L foot in front - 8.84 secs  Pt performed amb. On tip toes and on heels - 2 reps each inside // bars (20' x 2 ) reps - added to HEP    TherEx; pt performed heel raises - bilateral stance 10 reps with 3 sec hold L unilateral heel raise - 10 reps with bil. UE support  TUG scores = 17.13 secs with cane;  On 2nd rep 16.47 secs with  cane                        Without cane 14.46 secs without cane, 2nd rep 12.90 secs without cane   Self care ; discussed pt's goals with her and her daughter-in-law - wants to be able to eventually return to driving, gardening, And amb. without use of cane; pt also wants to be able to lift and carry objects for gardening, and negotiate her 2 small steps at  home without cane  Pt wishes to have time to consider option of continuing with PT vs. D/C next week (initial POC ends next week) - she did not wish to  Schedule any additional PT appts at this time                                PT Education - 09/03/16 1350    Education provided Yes   Education Details added unilateral heel raise, dorsiflexion with red  theraband, walking on tip toes and heels to HEP   Person(s) Educated Patient;Other (comment)   Methods Explanation;Demonstration;Handout   Comprehension Verbalized understanding;Returned demonstration          PT Short Term Goals - 08/18/16 1121      PT SHORT TERM GOAL #1   Title Patient will demonstrate decreased fall risk with Berg balance score 45/56 or greater. (all STGs due 08/10/2016)   Baseline 08/11/2016 Scored 46/56 today   Status Achieved     PT SHORT TERM GOAL #2   Title Patient to complete TUG and set goal as appropriate for fall risk reduction.   Baseline Tug performed today, completed in 17.49 sec. Was previously performed on 11/16 in 16.87 sec. No goal has been set   Status On-going     PT [place] #3   Title Patient to verbalize CVA warning signs and risk factors with modifications for prevention.    Baseline Patient provided with stroke prevention handout today.   Status On-going     PT SHORT TERM GOAL #4   Title Patient to ambulate indoor surfaces with cane and S over 300' no LOB.    Baseline 08/11/16 Met Patient ambulated 345 with S today   Status Achieved           PT Long Term Goals - 09/03/16 1402      PT  LONG TERM GOAL #1   Title Patient to demonstrate decreased fall risk with Gait velocity >0.8 m/s (all LTGs due 09/04/16) = 2.62 ft/sec    Time 8   Period Weeks   Status New     PT LONG TERM GOAL #2   Title Patient to be independent with HEP for balance and strength and walking program.   Baseline met 09-03-16   Time 8   Period Weeks   Status Achieved     PT LONG TERM GOAL #3   Title Patient to ambulate indoors without device independent 300' no LOB   Baseline pt reports ability to do this - to be assessed in clinic   Status New     PT LONG TERM GOAL #4   Title Patient to ambulate outdoors with cane and no LOB 500' paved and grassy surfaces.    Baseline met 08-27-16   Status Achieved         Today's session focused on higher level balance testing for baselines for new updated LTG's for renewal - but pt declined Scheduling more PT appts at this time - wants to have some time to discuss continuation of PT with her family before Making definite decision      Plan - 09/03/16 1358    Clinical Impression Statement Pt is progressing well towards LTG's - has potential to further progress and improve higher level balance skills and amb. without use of cane - pt wishes to have time to consider whether or not she wishes to continue with PT or if she wants to proceed with D/C next week   Rehab Potential Good   PT Frequency 2x / week   PT Duration 8 weeks   PT Treatment/Interventions ADLs/Self Care Home Management;Functional mobility training;Stair training;Gait training;DME Instruction;Therapeutic activities;Therapeutic exercise;Neuromuscular re-education;Patient/family education;Balance training;Vestibular   PT Next Visit Plan gait train with/without SPC - cont high level balance/gait activities   PT Home Exercise Plan added heel raises in standing, dorsiflexion with theraband and amb. on tip toes and heels to HEP   Consulted and Agree with Plan of Care Patient;Family  member/caregiver    Family Member Consulted daughter in law - Jarrett Soho      Patient will benefit from skilled therapeutic intervention in order to improve the following deficits and impairments:  Abnormal gait, Impaired UE functional use, Decreased safety awareness, Decreased endurance, Decreased activity tolerance, Decreased balance, Decreased mobility, Decreased strength, Decreased knowledge of use of DME, Pain, Decreased cognition  Visit Diagnosis: Other abnormalities of gait and mobility  Unsteadiness on feet     Problem List Patient Active Problem List   Diagnosis Date Noted  . 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 Gait disturbance, post-stroke 07/21/2016  . Ankle contracture, right 07/21/2016  . Post-operative pain   . Benign essential HTN   . Urinary retention   . Closed fracture of left proximal humerus   . Left pontine cerebrovascular accident ([place]) 06/26/2016  . Stroke (cerebrum) ([place]) 06/23/2016  . Blood pressure elevated without history of HTN 06/23/2016  . Left pontine stroke ([place]) 06/23/2016  . Stroke-like symptoms   . Eye movement disorder   . Closed fracture of neck of humerus with routine healing     Shonte Soderlund, [place], PT 09/03/2016, 2:08 PM  [facility] [address] [place], [place], [address] Phone: [phone]   Fax:  [phone]  Name: Melanie Hendricks MRN: [id number] Date of Birth: 07-30-1936

## 2016-09-03 NOTE — Patient Instructions (Signed)
Dorsiflexion: Resisted    Facing anchor, tubing around left foot, pull toward face.  Repeat ___10_ times per set. Do ___3_ sets per session. Do __1-2Heel Raise: Bilateral (Standing)    Rise on balls of feet. Repeat _10_ times per set. Do __1__ sets per session. Do _1-2Heel Raise: Unilateral (Standing)    Balance on left foot, then rise on ball of foot. Repeat _10___ times per set. Do __3_ sets per session. Do _1-2__ sessions per day.   ALSO DO WALKING ON TIP TOES AND ON HEELS ALONG COUNTERTOP - 2-3 TIMES/DAY

## 2016-09-03 NOTE — Patient Instructions (Addendum)
Elbow Extension: Press - Resisted    Lie on back, 1 pound weight in left hand, arm out to side, elbow bent. Press arm toward ceiling. Return slowly. Repeat 15 times per set. Do 1-2 sets per session. Do 1 sessions per day.  Copyright  VHI. All rights reserved.  Progressive Resisted: External Rotation (Side-Lying)    Holding 1 pound weight, towel under arm, raise right forearm toward ceiling. Keep elbow bent and at side. Repeat 15 times per set. Do 1-2 sets per session. Do 1 sessions per day.    Copyright  VHI. All rights reserved.  Progressive Resisted: Extension (Standing)    Holding 1 pound weights, raise arms back, keeping elbows straight. Repeat 15 times per set. Do 1-2 sets per session. Do 1 sessions per day.  http://orth.exer.us/874   Copyright  VHI. All rights reserved.       Lie on back, 1 pound weight in left hand (thumb up), Keep elbow straight.  Slowly raise arm to shoulder height and then Return slowly. Repeat 15 times per set. Do 1-2 sets per session. Do 1 sessions per day.    Lie on your side.  Holding 1 pound weight, raise left arm  toward ceiling keeping elbow straight (palm down). Repeat 15 times per set. Do 1-2 sets per session. Do 1 sessions per day.

## 2016-09-04 ENCOUNTER — Ambulatory Visit (INDEPENDENT_AMBULATORY_CARE_PROVIDER_SITE_OTHER): Payer: Medicare Other | Admitting: Family

## 2016-09-04 ENCOUNTER — Encounter: Payer: Self-pay | Admitting: Family

## 2016-09-04 ENCOUNTER — Other Ambulatory Visit (INDEPENDENT_AMBULATORY_CARE_PROVIDER_SITE_OTHER): Payer: Medicare Other

## 2016-09-04 VITALS — BP 142/62 | HR 105 | Temp 97.4°F | Resp 16 | Ht 66.0 in | Wt 200.0 lb

## 2016-09-04 DIAGNOSIS — I10 Essential (primary) hypertension: Secondary | ICD-10-CM

## 2016-09-04 DIAGNOSIS — I6302 Cerebral infarction due to thrombosis of basilar artery: Secondary | ICD-10-CM | POA: Diagnosis not present

## 2016-09-04 LAB — LIPID PANEL
CHOL/HDL RATIO: 2
Cholesterol: 162 mg/dL (ref 0–200)
HDL: 85.6 mg/dL (ref >39.00)
LDL CALC: 60 mg/dL (ref 0–99)
NonHDL: 76.57
Triglycerides: 81 mg/dL (ref 0.0–149.0)
VLDL: 16.2 mg/dL (ref 0.0–40.0)

## 2016-09-04 MED ORDER — CLOPIDOGREL BISULFATE 75 MG PO TABS: 75.0000 mg | ORAL_TABLET | Freq: Every day | ORAL | 0 refills | Status: DC

## 2016-09-04 MED ORDER — DOCUSATE SODIUM 100 MG PO CAPS: 100.0000 mg | ORAL_CAPSULE | Freq: Two times a day (BID) | ORAL | 0 refills | Status: DC

## 2016-09-04 MED ORDER — ATORVASTATIN CALCIUM 40 MG PO TABS
40.0000 mg | ORAL_TABLET | Freq: Every day | ORAL | 0 refills | Status: DC
Start: 2016-09-04 — End: 2016-12-07

## 2016-09-04 MED ORDER — ATORVASTATIN CALCIUM 40 MG PO TABS: 40.0000 mg | ORAL_TABLET | Freq: Every day | ORAL | 0 refills | Status: DC

## 2016-09-04 MED ORDER — LISINOPRIL 10 MG PO TABS: 10.0000 mg | ORAL_TABLET | Freq: Every day | ORAL | 0 refills | Status: DC

## 2016-09-04 NOTE — Assessment & Plan Note (Signed)
Stroke risk factors currently include hypertension and elevated LDL. Obtain lipid profile. Hypertension addressed. Continue current dosage of clopidogrel and atorvastatin. Changes to medication pending blood work as needed. Continue to monitor.

## 2016-09-04 NOTE — Progress Notes (Signed)
Subjective:    Patient ID: Melanie Hendricks, female    DOB: [birth year], 80 y.o.   MRN: [id number]  Chief Complaint  Patient presents with  . Follow-up    needs refills on all medications, only needs plavix through jan 15th    HPI:  Melanie Hendricks is a 80 y.o. female who  has a past medical history of Allergy; Fx humeral neck; Hepatitis; Hyperlipidemia; Hypertension; Skin melanoma ([place]); and Stroke ([place]). and presents today for a follow up office visit.   1.) Hypertension - Currently managed with lisinopril. Reports taking the medication as prescribed and denies adverse side effects or cough. Not currently checking her blood pressure at home. Denies worst headache of life or new symptoms of end organ damage. She continues to work on a low sodium diet.   BP Readings from Last 3 Encounters:  09/04/16 (!) 142/62  08/18/16 140/73  08/13/16 (!) 162/76    2.) Stroke prevention - Currently maintained on Plavix and aspirin for anticoagulation and atorvastatin for cholesterol reduction. Denies adverse side effects of myalgias or nuisance bleeding.   Lab Results  Component Value Date   CHOL 162 09/04/2016   HDL 85.60 09/04/2016   LDLCALC 60 09/04/2016   TRIG 81.0 09/04/2016   CHOLHDL 2 09/04/2016    No Known Allergies    Outpatient Medications Prior to Visit  Medication Sig Dispense Refill  . aspirin EC 81 MG EC tablet Take 1 tablet (81 mg total) by mouth daily.    . traMADol (ULTRAM) 50 MG tablet Take 1 tablet (50 mg total) by mouth every 6 (six) hours as needed for moderate pain. 30 tablet 0  . acetaminophen (TYLENOL) 325 MG tablet Take 500 mg by mouth every 6 (six) hours as needed for mild pain or headache.     Marland Kitchen atorvastatin (LIPITOR) 40 MG tablet Take 1 tablet (40 mg total) by mouth daily at 6 PM. 30 tablet 1  . clopidogrel (PLAVIX) 75 MG tablet Take 1 tablet (75 mg total) by mouth daily. 30 tablet 1  . docusate sodium (COLACE) 100 MG capsule Take 1 capsule (100 mg total)  by mouth 2 (two) times daily. 10 capsule 0  . lisinopril (PRINIVIL,ZESTRIL) 5 MG tablet Take 1 tablet (5 mg total) by mouth daily. 30 tablet 1   No facility-administered medications prior to visit.      Review of Systems  Constitutional: Negative for chills and fever.  Eyes:       Negative for changes in vision  Respiratory: Negative for cough, chest tightness and wheezing.   Cardiovascular: Negative for chest pain, palpitations and leg swelling.  Neurological: Negative for dizziness, weakness and light-headedness.      Objective:    BP (!) 142/62 (BP Location: Left Arm, Patient Position: Sitting, Cuff Size: Large)   Pulse (!) 105   Temp 97.4 F (36.3 C) (Oral)   Resp 16   Ht 5\' 6"  (1.676 m)   Wt 200 lb (90.7 kg)   SpO2 95%   BMI 32.28 kg/m  Nursing note and vital signs reviewed.  Physical Exam  Constitutional: She is oriented to person, place, and time. She appears well-developed and well-nourished. No distress.  Cardiovascular: Normal rate, regular rhythm, normal heart sounds and intact distal pulses.   Pulmonary/Chest: Effort normal and breath sounds normal.  Neurological: She is alert and oriented to person, place, and time.  Skin: Skin is warm and dry.  Psychiatric: She has a normal mood and affect. Her  behavior is normal. Judgment and thought content normal.       Assessment & Plan:   Problem List Items Addressed This Visit      Cardiovascular and Mediastinum   Stroke (cerebrum) ([place])    Stroke risk factors currently include hypertension and elevated LDL. Obtain lipid profile. Hypertension addressed. Continue current dosage of clopidogrel and atorvastatin. Changes to medication pending blood work as needed. Continue to monitor.       Relevant Medications   lisinopril (PRINIVIL,ZESTRIL) 10 MG tablet   atorvastatin (LIPITOR) 40 MG tablet   Other Relevant Orders   Lipid Profile (Completed)   Benign essential HTN - Primary    Blood pressure remains slightly  elevated above goal 140/90 and multiple occasions. No adverse side effects or hypotensive readings. Increase lisinopril to 10 mg daily. Continue to monitor blood pressures at home and follow low sodium diet. Continue to monitor.       Relevant Medications   lisinopril (PRINIVIL,ZESTRIL) 10 MG tablet   atorvastatin (LIPITOR) 40 MG tablet       I have discontinued Ms. Depace's acetaminophen. I have also changed her lisinopril. Additionally, I am having her maintain her aspirin, traMADol, clopidogrel, atorvastatin, and docusate sodium.   Meds ordered this encounter  Medications  . DISCONTD: atorvastatin (LIPITOR) 40 MG tablet    Sig: Take 1 tablet (40 mg total) by mouth daily at 6 PM.    Dispense:  90 tablet    Refill:  0  . clopidogrel (PLAVIX) 75 MG tablet    Sig: Take 1 tablet (75 mg total) by mouth daily.    Dispense:  14 tablet    Refill:  0  . DISCONTD: docusate sodium (COLACE) 100 MG capsule    Sig: Take 1 capsule (100 mg total) by mouth 2 (two) times daily.    Dispense:  60 capsule    Refill:  0  . lisinopril (PRINIVIL,ZESTRIL) 10 MG tablet    Sig: Take 1 tablet (10 mg total) by mouth daily.    Dispense:  90 tablet    Refill:  0  . atorvastatin (LIPITOR) 40 MG tablet    Sig: Take 1 tablet (40 mg total) by mouth daily at 6 PM.    Dispense:  90 tablet    Refill:  0    Order Specific Question:   Supervising Provider    Answer:   Pricilla Holm A [id number]  . docusate sodium (COLACE) 100 MG capsule    Sig: Take 1 capsule (100 mg total) by mouth 2 (two) times daily.    Dispense:  60 capsule    Refill:  0    Order Specific Question:   Supervising Provider    Answer:   Pricilla Holm A [id number]     Follow-up: Return in about 3 months (around 12/03/2016), or if symptoms worsen or fail to improve.  Mauricio Po, FNP

## 2016-09-04 NOTE — Patient Instructions (Addendum)
Thank you for choosing [facility].  SUMMARY AND INSTRUCTIONS:  Medication:  Please continue to take your medication as prescribed.   Your prescription(s) have been submitted to your pharmacy or been printed and provided for you. Please take as directed and contact our office if you believe you are having problem(s) with the medication(s) or have any questions.  Labs:  Please stop by the lab on the lower level of the building for your blood work. Your results will be released to MyChart (or called to you) after review, usually within 72 hours after test completion. If any changes need to be made, you will be notified at that same time.  1.) The lab is open from 7:30am to 5:30 pm Monday-Friday 2.) No appointment is necessary 3.) Fasting (if needed) is 6-8 hours after food and drink; black coffee and water are okay    Follow up:  If your symptoms worsen or fail to improve, please contact our office for further instruction, or in case of emergency go directly to the emergency room at the closest medical facility.     

## 2016-09-04 NOTE — Assessment & Plan Note (Signed)
Blood pressure remains slightly elevated above goal 140/90 and multiple occasions. No adverse side effects or hypotensive readings. Increase lisinopril to 10 mg daily. Continue to monitor blood pressures at home and follow low sodium diet. Continue to monitor.

## 2016-09-08 ENCOUNTER — Ambulatory Visit: Payer: Medicare Other | Admitting: Occupational Therapy

## 2016-09-08 ENCOUNTER — Encounter: Payer: Self-pay | Admitting: Physical Therapy

## 2016-09-08 ENCOUNTER — Ambulatory Visit: Payer: Medicare Other | Attending: Physical Medicine & Rehabilitation | Admitting: Physical Therapy

## 2016-09-08 DIAGNOSIS — M25612 Stiffness of left shoulder, not elsewhere classified: Secondary | ICD-10-CM

## 2016-09-08 DIAGNOSIS — R278 Other lack of coordination: Secondary | ICD-10-CM | POA: Diagnosis present

## 2016-09-08 DIAGNOSIS — R41840 Attention and concentration deficit: Secondary | ICD-10-CM | POA: Diagnosis present

## 2016-09-08 DIAGNOSIS — I69318 Other symptoms and signs involving cognitive functions following cerebral infarction: Secondary | ICD-10-CM | POA: Diagnosis present

## 2016-09-08 DIAGNOSIS — I69351 Hemiplegia and hemiparesis following cerebral infarction affecting right dominant side: Secondary | ICD-10-CM

## 2016-09-08 DIAGNOSIS — R269 Unspecified abnormalities of gait and mobility: Secondary | ICD-10-CM | POA: Diagnosis present

## 2016-09-08 DIAGNOSIS — M25512 Pain in left shoulder: Secondary | ICD-10-CM | POA: Diagnosis present

## 2016-09-08 DIAGNOSIS — R2681 Unsteadiness on feet: Secondary | ICD-10-CM

## 2016-09-08 DIAGNOSIS — M6281 Muscle weakness (generalized): Secondary | ICD-10-CM | POA: Insufficient documentation

## 2016-09-08 DIAGNOSIS — R2689 Other abnormalities of gait and mobility: Secondary | ICD-10-CM | POA: Insufficient documentation

## 2016-09-08 NOTE — Therapy (Signed)
[facility] [address] [place], [place], [address] Phone: [phone]   Fax:  [phone]  Occupational Therapy Treatment  Patient Details  Name: Melanie Hendricks MRN: [id number] Date of Birth: 05-Mar-1936 Referring Provider: Dr. Alysia Penna  Encounter Date: 09/08/2016      OT End of Session - 09/08/16 0905    Visit Number 15   Number of Visits 17   Date for OT Re-Evaluation 09/11/16   Authorization Type [employer] Medicare, no visit limit/auth; needs G-code   Authorization - Visit Number 15   Authorization - Number of Visits 17   OT Start Time [phone]   OT Stop Time 0932   OT Time Calculation (min) 39 min   Activity Tolerance Patient tolerated treatment well   Behavior During Therapy [facility] for tasks assessed/performed      Past Medical History:  Diagnosis Date  . Allergy   . Fx humeral neck   . Hepatitis   . Hyperlipidemia   . Hypertension   . Skin melanoma ([place])   . Stroke [facility])     Past Surgical History:  Procedure Laterality Date  . Melenoma removal    . TONSILLECTOMY      There were no vitals filed for this visit.      Subjective Assessment - 09/08/16 0859    Subjective  Pt reports that she wishes to continue OT   Patient is accompained by: Family member   Pertinent History hx of fall; L humerus fx 05/14/16 (s/p 8 wks at time of eval)--WBAT, ok for ROM (Dr. Tania Ade following); monitored for skin CA hx; hearing loss (particularly R side)   Limitations fall risk; L humerus fx 05/14/16 (s/p 8 wks at time of eval)--WBAT, ok for ROM (Dr. Tania Ade following), no driving, hearing loss   Patient Stated Goals return to being active   Currently in Pain? No/denies   Pain Score --  only soreness occasionally       In supine, shoulder flex with cane frame for stretch.  Followed by shoulder abduction cane ex for stretch with LUE.  ER AROM stretch in supine bilaterally.   Wall slides for shoulder  flexion/abduction with min facilitation at scapula for incr mobility x10 each.  Wall push ups x10 with min-mod difficulty for incr scapular strength.                       OT Education - 09/08/16 0907    Education Details Reviewed updated shoulder HEP with 1lb wt. (issued last session);  Updated putty HEP to green   Person(s) Educated Patient;Child(ren)   Methods Explanation;Demonstration;Verbal cues   Comprehension Verbalized understanding;Returned demonstration          OT Short Term Goals - 08/27/16 0926      OT SHORT TERM GOAL #1   Title Pt will be independent with initial HEP--check STGs 08/11/16   Baseline Pt demonstrated I'ly in clinic and verbalized.   Time 4   Period Weeks   Status Achieved     OT SHORT TERM GOAL #2   Title Pt will improve coordination for ADLs as shown by improving time on 9-hole peg test by 5sec bilaterally.   Baseline R-32.87sec, L-42.41sec; 08/11/16 R= 28.17 sec, L= 31.69 sec   Time 4   Period Weeks   Status Achieved     OT SHORT TERM GOAL #3   Title Pt will demo at least 100* L shoulder flex for functional reaching/ADLs  with 3/10 pain or less.   Baseline 90* @ Eval; 08/11/16 = 125* painfree   Time 4   Period Weeks   Status Achieved     OT SHORT TERM GOAL #4   Title Pt will improve L grip strength by at least 5lbs to assist with opening containers/ADLs.   Baseline 15lbs at Eval; 08/11/16 R= 48# vs L = 39#   Time 4   Period Weeks   Status Achieved     OT SHORT TERM GOAL #5   Title Pt will verbalize understanding of memory/cognitive compensation strategies for ADLs prn.   Time 4   Period Weeks   Status Achieved     OT SHORT TERM GOAL #6   Title Pt will perform simple snack prep/home maintenance task mod I.   Time 4   Period Weeks   Status Achieved           OT Long Term Goals - 09/08/16 1646      OT LONG TERM GOAL #1   Title Pt will be independent with updated HEP.--check LTGs 09/11/16   Time 8   Period Weeks    Status Achieved  09/08/16     OT LONG TERM GOAL #2   Title Pt will improve coordination for ADLs as shown by improving time on 9-hole peg test by 10sec with LUE.   Baseline 42.41sec   Time 8   Period Weeks   Status Achieved     OT LONG TERM GOAL #3   Title Pt will demo at least 110* L shoulder flex for functional reaching/ADLs with less than 2/10 pain.  Updated 08/12/16:  Pt will demo at least 135* L shoulder flex for functional reaching/ADLs with less than 2/10 pain.   Baseline 90*   Time 8   Period Weeks   Status Revised  08/12/16:  met initial goal and upgraded.   09/03/16:  120*     OT LONG TERM GOAL #4   Title Pt will improve L grip strength by at least 15lbs to assist with opening containers/ADLs.  Updated 08/12/16:  Pt will improve L grip strength to at least 42lbs to assist with opening containers/ADLs.   Baseline 15lbs   Time 8   Period Weeks   Status Revised  08/12/16  met initial goal and upgraded.  09/03/16  34lbs     OT LONG TERM GOAL #5   Title Pt will demo at least 100* L shoulder abduction for lateral reaching/ADLs with less than 3/10 pain.   Baseline 90*   Time 8   Period Weeks   Status Achieved  09/03/16:  115*     OT LONG TERM GOAL #6   Title Pt will perform simple cooking tasks/meal prep mod I.  08/12/16 updated:  Pt will perform mod complex cooking tasks/meal prep mod I.   Time 8   Period Weeks   Status Revised  08/12/16  met initial goal and upgraded.       OT LONG TERM GOAL #7   Title Pt will use LUE as nondominant assist for ADLs/IADLs at least 85% of the time   Time 8   Period Weeks   Status Achieved  08/12/16 per pt report               Plan - 09/08/16 0905    Clinical Impression Statement Pt continues to progress towards goals and is tolerating progressiong of strengthening activities well.   Rehab Potential Good   OT Frequency 2x /  week   OT Duration 8 weeks   OT Treatment/Interventions Self-care/ADL  training;Cryotherapy;Parrafin;Therapeutic exercise;DME and/or AE instruction;Therapist, nutritional;Therapeutic activities;Patient/family education;Balance training;Cognitive remediation/compensation;Splinting;Manual Therapy;Neuromuscular education;Fluidtherapy;Ultrasound;Electrical Stimulation;Moist Heat;Energy conservation;Passive range of motion;Therapeutic exercises   Plan check LTGs and renew next session.   OT Home Exercise Plan Education provided:  Reviewed HEP for LUE ROM from hospital (pt to continue), coordination HEP upgraded putty to red; yellow theraband HEP (shoulder abduction, ext, flex, horizontal abduction); In shoulder flex, chest press, ER, horizontal abduction, ext with 1lb wt    Consulted and Agree with Plan of Care Patient;Family member/caregiver   Family Member Consulted dtr-in-law Jarrett Soho)      Patient will benefit from skilled therapeutic intervention in order to improve the following deficits and impairments:  Decreased coordination, Decreased knowledge of use of DME, Decreased strength, Impaired UE functional use, Decreased activity tolerance, Decreased range of motion, Pain, Decreased cognition, Decreased balance, Decreased mobility  Visit Diagnosis: Stiffness of left shoulder, not elsewhere classified  Muscle weakness (generalized)  Acute pain of left shoulder  Other lack of coordination  Hemiplegia and hemiparesis following cerebral infarction affecting right dominant side [facility])    Problem List Patient Active Problem List   Diagnosis Date Noted  . 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 Gait disturbance, post-stroke 07/21/2016  . Ankle contracture, right 07/21/2016  . Post-operative pain   . Benign essential HTN   . Urinary retention   . Closed fracture of left proximal humerus   . Left pontine cerebrovascular accident ([place]) 06/26/2016  . Stroke (cerebrum) ([place]) 06/23/2016  . Blood pressure elevated without history of HTN 06/23/2016  . Left  pontine stroke ([place]) 06/23/2016  . Stroke-like symptoms   . Eye movement disorder   . Closed fracture of neck of humerus with routine healing     [facility] 09/08/2016, 4:46 PM  [facility] [address] [place], [place], [address] Phone: [phone]   Fax:  [phone]  Name: Melanie Hendricks MRN: [id number] Date of Birth: 09-18-1935   Vianne Bulls, OTR/L [facility] [address] [place], [place]  [address] [phone] phone [phone] 09/08/16 4:46 PM

## 2016-09-08 NOTE — Therapy (Signed)
[facility] [address] [place], [place], [address] Phone: [phone]   Fax:  [phone]  Physical Therapy Treatment  Patient Details  Name: Melanie Hendricks MRN: [id number] Date of Birth: Jan 13, 1936 Referring Provider: Ella Bodo  Encounter Date: 09/08/2016      PT End of Session - 09/08/16 0937    Visit Number 15   Number of Visits 16   Date for PT Re-Evaluation 09/14/16   Authorization Type Medicare, G-Code every 10th visit   PT Start Time 0934   PT Stop Time 1015   PT Time Calculation (min) 41 min      Past Medical History:  Diagnosis Date  . Allergy   . Fx humeral neck   . Hepatitis   . Hyperlipidemia   . Hypertension   . Skin melanoma ([place])   . Stroke [facility])     Past Surgical History:  Procedure Laterality Date  . Melenoma removal    . TONSILLECTOMY      There were no vitals filed for this visit.      Subjective Assessment - 09/08/16 0936    Subjective No new complaints. Saw family NP and BP med was increased with goal below 135. No falls or pain currently.   Patient is accompained by: Family member  daughter in law Melanie Hendricks   Pertinent History Had CVA last month, completed inpiatient rehab last Friday   Limitations Standing;Walking;Other (comment);House hold activities   Patient Stated Goals To return to usual self, driving, gardening, mowing yard, laundry, etc   Currently in Pain? No/denies   Pain Score 0-No pain            [facility] Adult PT Treatment/Exercise - 09/08/16 0939      Ambulation/Gait   Ambulation/Gait Yes   Ambulation/Gait Assistance 5: Supervision   Ambulation/Gait Assistance Details occasional cues for step length and arm swing with gait   Ambulation Distance (Feet) 645 Feet   Assistive device None   Gait Pattern Step-through pattern;Decreased stride length  progressed to incr stride length   Ambulation Surface Level;Indoor     High Level Balance   High Level  Balance Activities Tandem walking;Marching forwards;Marching backwards;Negotiating over obstacles  tandem fwd/bwd, toe walk fwd/bwd, heel walk fwd/bwd   High Level Balance Comments on red/blue mats combined with no UE support:  marching, tandem gait, heel walking, toe walking all performed fwd/bwd x 3 laps each with no UE support, min guard to min assist for balance; ;on both red mats with high/low hurdles in random placement: stepping over with step to pattern, alternating lead leg with min HHA and cues for increased hip/knee flexion and weight shifting x 6 laps.                         Neuro Re-ed    Neuro Re-ed Details  on both red mats with 6 cones along edges: alternating toe taps to each with side stepping left/right x 1 lap each way, double toe taps to each cone with side stepping left/right x 1 lap each way. min guard to min assist for balance with cues on posture, to slow down and for weight shifting for increased stance control/balance.                                  PT Short Term Goals - 08/18/16 1121      PT SHORT  TERM GOAL #1   Title Patient will demonstrate decreased fall risk with Berg balance score 45/56 or greater. (all STGs due 08/10/2016)   Baseline 08/11/2016 Scored 46/56 today   Status Achieved     PT SHORT TERM GOAL #2   Title Patient to complete TUG and set goal as appropriate for fall risk reduction.   Baseline Tug performed today, completed in 17.49 sec. Was previously performed on 11/16 in 16.87 sec. No goal has been set   Status On-going     PT [place] #3   Title Patient to verbalize CVA warning signs and risk factors with modifications for prevention.    Baseline Patient provided with stroke prevention handout today.   Status On-going     PT SHORT TERM GOAL #4   Title Patient to ambulate indoor surfaces with cane and S over 300' no LOB.    Baseline 08/11/16 Met Patient ambulated 345 with S today   Status Achieved           PT Long Term  Goals - 09/03/16 1402      PT LONG TERM GOAL #1   Title Patient to demonstrate decreased fall risk with Gait velocity >0.8 m/s (all LTGs due 09/04/16) = 2.62 ft/sec    Time 8   Period Weeks   Status New     PT LONG TERM GOAL #2   Title Patient to be independent with HEP for balance and strength and walking program.   Baseline met 09-03-16   Time 8   Period Weeks   Status Achieved     PT LONG TERM GOAL #3   Title Patient to ambulate indoors without device independent 300' no LOB   Baseline pt reports ability to do this - to be assessed in clinic   Status New     PT LONG TERM GOAL #4   Title Patient to ambulate outdoors with cane and no LOB 500' paved and grassy surfaces.    Baseline met 08-27-16   Status Achieved            Plan - 09/08/16 8325    Clinical Impression Statement Today's session continued to address gait with no AD and high level balance. Pt does wish to continue with PT to further progress and improve higher level balance skilles and progress gait without AD on vairous surfaces for community distances. Primary PT to renew.                                Rehab Potential Good   PT Frequency 2x / week   PT Duration 8 weeks   PT Treatment/Interventions ADLs/Self Care Home Management;Functional mobility training;Stair training;Gait training;DME Instruction;Therapeutic activities;Therapeutic exercise;Neuromuscular re-education;Patient/family education;Balance training;Vestibular   PT Next Visit Plan gait train with/without SPC - cont high level balance/gait activities   PT Home Exercise Plan added heel raises in standing, dorsiflexion with theraband and amb. on tip toes and heels to HEP   Consulted and Agree with Plan of Care Patient;Family member/caregiver   Family Member Consulted daughter in law - Melanie Hendricks      Patient will benefit from skilled therapeutic intervention in order to improve the following deficits and impairments:  Abnormal gait, Impaired UE  functional use, Decreased safety awareness, Decreased endurance, Decreased activity tolerance, Decreased balance, Decreased mobility, Decreased strength, Decreased knowledge of use of DME, Pain, Decreased cognition  Visit Diagnosis: Other abnormalities of gait and mobility  Unsteadiness on feet  Muscle weakness (generalized)  Hemiplegia and hemiparesis following cerebral infarction affecting right dominant side [facility])     Problem List Patient Active Problem List   Diagnosis Date Noted  . 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 Gait disturbance, post-stroke 07/21/2016  . Ankle contracture, right 07/21/2016  . Post-operative pain   . Benign essential HTN   . Urinary retention   . Closed fracture of left proximal humerus   . Left pontine cerebrovascular accident ([place]) 06/26/2016  . Stroke (cerebrum) ([place]) 06/23/2016  . Blood pressure elevated without history of HTN 06/23/2016  . Left pontine stroke ([place]) 06/23/2016  . Stroke-like symptoms   . Eye movement disorder   . Closed fracture of neck of humerus with routine healing     Willow Ora, PTA, [facility] [address] [place], [place] [address] [phone] 09/09/16, 8:35 AM   Name: Melanie Hendricks MRN: [id number] Date of Birth: 09/23/1935

## 2016-09-10 ENCOUNTER — Encounter: Payer: Self-pay | Admitting: Occupational Therapy

## 2016-09-10 ENCOUNTER — Ambulatory Visit: Payer: Medicare Other | Admitting: Occupational Therapy

## 2016-09-10 ENCOUNTER — Ambulatory Visit: Payer: Medicare Other | Admitting: Physical Therapy

## 2016-09-10 VITALS — BP 140/76 | HR 84

## 2016-09-10 DIAGNOSIS — I69351 Hemiplegia and hemiparesis following cerebral infarction affecting right dominant side: Secondary | ICD-10-CM

## 2016-09-10 DIAGNOSIS — M6281 Muscle weakness (generalized): Secondary | ICD-10-CM

## 2016-09-10 DIAGNOSIS — M25512 Pain in left shoulder: Secondary | ICD-10-CM

## 2016-09-10 DIAGNOSIS — R2681 Unsteadiness on feet: Secondary | ICD-10-CM

## 2016-09-10 DIAGNOSIS — I69318 Other symptoms and signs involving cognitive functions following cerebral infarction: Secondary | ICD-10-CM

## 2016-09-10 DIAGNOSIS — R2689 Other abnormalities of gait and mobility: Secondary | ICD-10-CM | POA: Diagnosis not present

## 2016-09-10 DIAGNOSIS — M25612 Stiffness of left shoulder, not elsewhere classified: Secondary | ICD-10-CM

## 2016-09-10 DIAGNOSIS — R269 Unspecified abnormalities of gait and mobility: Secondary | ICD-10-CM

## 2016-09-10 DIAGNOSIS — R278 Other lack of coordination: Secondary | ICD-10-CM

## 2016-09-10 DIAGNOSIS — R41840 Attention and concentration deficit: Secondary | ICD-10-CM

## 2016-09-10 NOTE — Therapy (Signed)
[facility] [address] [place], [place], [address] Phone: [phone]   Fax:  [phone]  Physical Therapy Treatment  Patient Details  Name: Melanie Hendricks MRN: [id number] Date of Birth: 1936/03/16 Referring Provider: Ella Bodo  Encounter Date: 09/10/2016      PT End of Session - 09/10/16 1619    Visit Number 16   Number of Visits 24   Date for PT Re-Evaluation 10/11/16   Authorization Type Medicare, G-Code every 10th visit   PT Start Time 0845   PT Stop Time 0930   PT Time Calculation (min) 45 min   Equipment Utilized During Treatment Gait belt      Past Medical History:  Diagnosis Date  . Allergy   . Fx humeral neck   . Hepatitis   . Hyperlipidemia   . Hypertension   . Skin melanoma ([place])   . Stroke [facility])     Past Surgical History:  Procedure Laterality Date  . Melenoma removal    . TONSILLECTOMY      Vitals:   09/10/16 0848  BP: 140/76  Pulse: 84        Subjective Assessment - 09/10/16 1358    Subjective Patient reports she is going to get a BP monitor for home use, but has not yet. She feels RLE strength is improving, however remains very weak.   Patient is accompained by: Family member   Pertinent History Had CVA last month, completed inpatient rehab just prior to OPPT   Limitations Standing;Walking;Other (comment);House hold activities   Patient Stated Goals To return to usual self, driving, gardening, mowing yard, laundry, etc   Currently in Pain? No/denies                         [facility] Adult PT Treatment/Exercise - 09/10/16 1400      Ambulation/Gait   Ambulation/Gait Yes   Ambulation/Gait Assistance 4: Min guard   Ambulation/Gait Assistance Details no device; minguard due to performing dual task while walking (tossing ball to aide), forward walking and backward walking; walking on various compliant surfaces   Ambulation Distance (Feet) 300 Feet   Assistive  device None   Gait Pattern Step-through pattern;Decreased stride length;Decreased arm swing - right;Decreased arm swing - left;Right flexed knee in stance;Left flexed knee in stance;Decreased trunk rotation;Wide base of support   Ambulation Surface Level;Indoor;Other (comment)  blue and red mat   Stairs Yes   Stairs Assistance 5: Supervision   Stairs Assistance Details (indicate cue type and reason) supervision for safety;    Stair Management Technique No rails;One rail Right;Alternating pattern;Step to pattern;Forwards   Number of Stairs 4  x4   Height of Stairs 6   Ramp 5: Supervision   Ramp Details (indicate cue type and reason) supervision for safety; cautious, guarded gait with stride length decr signficantly     Neuro Re-ed    Neuro Re-ed Details  on red foam, two cones 18" apart--step taps to each cone and then alternate with other leg x 10     Knee/Hip Exercises: Standing   Hip Abduction Stengthening;Both;2 sets;10 reps  green band at ankles   Hip Extension Stengthening;Both;2 sets;10 reps;Knee straight  green band   Forward Step Up Right;1 set;10 reps;Step Height: 6";Hand Hold: 0             Balance Exercises - 09/10/16 1412      Balance Exercises: Standing   Partial Tandem Stance Eyes open;Foam/compliant surface;Upper  extremity support 2  30 sec; RLE in back x 3; LLE in back x2   Other Standing Exercises marching on incline (forward and backward facing) progressed to with head turns, cuing for RLE stability with lifting LLE; standing on level surface alternating step-taps to 1st step x 10, to 2nd step x 10 and each repeated on compliant surface; marching x 15 feet x  4 sets (2 forward, 2 backward) on compliant surfaces; in // bars standing on Bosu ball with bil UE support, head turns horizontally & vertically x 60 seconds; attempted Rt single leg stance on Bosu with bil UE support with immediate LOB; stepping over and back 2" foam x 10, 5" foam x 10;pt performed standing  perpendicular on blue foam with head turns for improved hip strategy and core stabilization           PT Education - 09/10/16 1433    Education provided Yes   Education Details Educated on proper form/technique during standing exercises (hip abdct, hip ext, hip flexion with leg extended). Pt reports doing these exercises at home   Person(s) Educated Patient;Child(ren)   Methods Explanation;Demonstration;Verbal cues   Comprehension Verbalized understanding;Returned demonstration;Need further instruction          PT Short Term Goals - 09/10/16 1618      PT SHORT TERM GOAL #1   Title Patient will demonstrate decreased fall risk with Berg balance score 45/56 or greater. (all STGs due 08/10/2016)   Baseline 08/11/2016 Scored 46/56 today   Status Achieved     PT SHORT TERM GOAL #2   Title Patient to complete TUG and set goal as appropriate for fall risk reduction.   Baseline Tug performed today, completed in 17.49 sec. Was previously performed on 11/16 in 16.87 sec. No goal has been set   Status On-going     PT D'Lo #3   Title Patient to verbalize CVA warning signs and risk factors with modifications for prevention.    Baseline Patient provided with stroke prevention handout today.   Status On-going     PT SHORT TERM GOAL #4   Title Patient to ambulate indoor surfaces with cane and S over 300' no LOB.    Baseline 08/11/16 Met Patient ambulated 345 with S today   Status Achieved           PT Long Term Goals - 09/10/16 1603      PT LONG TERM GOAL #1   Title Patient to demonstrate decreased fall risk with Gait velocity >0.8 m/s (all LTGs due 09/04/16) = 2.62 ft/sec   New Target date 10-11-16   Time 4   Period Weeks   Status On-going     PT LONG TERM GOAL #2   Title Patient to be independent with HEP for balance and strength and walking program.   Baseline met 09-03-16   Status Achieved     PT LONG TERM GOAL #3   Title Patient to ambulate indoors without  device independent 300' no LOB   Baseline pt reports ability to do this - to be assessed in clinic   Status On-going     PT LONG TERM GOAL #4   Title Patient to ambulate outdoors with cane and no LOB 500' paved and grassy surfaces.    Baseline met 08-27-16   Status Achieved     PT LONG TERM GOAL #5   Title Independent in updated HEP, progress from red to green theraband for hip strengthening exs. and balance  exs as appropriate.  10-11-16   Time 4   Period Weeks   Status New     Additional Long Term Goals   Additional Long Term Goals --     PT LONG TERM GOAL #6   Title Increase DGI by at least 4 points to increase safety with gait.  10-11-16   Time 4   Period Weeks   Status New     PT LONG TERM GOAL #7   Title Amb. with supervision 200' outside on paved surface without cane .  10-11-16   Time 4   Period Weeks   Status New               Plan - 09/10/16 1602    Clinical Impression Statement Pt reports she cont. to note RLE weakness & imbalance during gait (especially with longer distances).  Session focused on higher level balance activities and strengthening LE's. Discussed goal of returning to garden (using stool and raised  garden beds).    Rehab Potential Good   PT Frequency 2x / week   PT Duration 4 weeks   PT Treatment/Interventions ADLs/Self Care Home Management;Functional mobility training;Stair training;Gait training;DME Instruction;Therapeutic activities;Therapeutic exercise;Neuromuscular re-education;Patient/family education;Balance training;Vestibular   PT Next Visit Plan check LTG #1: and 3;  gait train with/without SPC - cont high level balance/gait activities; Do DGI   PT Home Exercise Plan added heel raises in standing, dorsiflexion with theraband and amb. on tip toes and heels to HEP   Consulted and Agree with Plan of Care Patient;Family member/caregiver   Family Member Consulted daughter in law - Jarrett Soho      Patient will benefit from skilled therapeutic  intervention in order to improve the following deficits and impairments:  Abnormal gait, Impaired UE functional use, Decreased safety awareness, Decreased endurance, Decreased activity tolerance, Decreased balance, Decreased mobility, Decreased strength, Decreased knowledge of use of DME, Pain, Decreased cognition  Visit Diagnosis: Muscle weakness (generalized) - Plan: PT plan of care cert/re-cert  Hemiplegia and hemiparesis following cerebral infarction affecting right dominant side ([place]) - Plan: PT plan of care cert/re-cert  Abnormality of gait - Plan: PT plan of care cert/re-cert     Problem List Patient Active Problem List   Diagnosis Date Noted  . 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 Gait disturbance, post-stroke 07/21/2016  . Ankle contracture, right 07/21/2016  . Post-operative pain   . Benign essential HTN   . Urinary retention   . Closed fracture of left proximal humerus   . Left pontine cerebrovascular accident ([place]) 06/26/2016  . Stroke (cerebrum) ([place]) 06/23/2016  . Blood pressure elevated without history of HTN 06/23/2016  . Left pontine stroke ([place]) 06/23/2016  . Stroke-like symptoms   . Eye movement disorder   . Closed fracture of neck of humerus with routine healing     Dannell Gortney, [place], PT 09/10/2016, 4:44 PM  [facility] [address] [place], [place], [address] Phone: [phone]   Fax:  [phone]  Name: Melanie Hendricks MRN: [id number] Date of Birth: 1936/07/21

## 2016-09-10 NOTE — Therapy (Signed)
[facility] [address] [place], [place], [address] Phone: [phone]   Fax:  [phone]  Occupational Therapy Treatment  Patient Details  Name: Melanie Hendricks MRN: [id number] Date of Birth: 1936/03/22 Referring Provider: Dr. Alysia Penna  Encounter Date: 09/10/2016      OT End of Session - 09/10/16 0858    Visit Number 16   Number of Visits 17   Date for OT Re-Evaluation 09/11/16   Authorization Type [employer] Medicare, no visit limit/auth; needs G-code   Authorization - Visit Number 16   Authorization - Number of Visits 17   OT Start Time 0801   OT Stop Time 0845   OT Time Calculation (min) 44 min   Activity Tolerance Patient tolerated treatment well      Past Medical History:  Diagnosis Date  . Allergy   . Fx humeral neck   . Hepatitis   . Hyperlipidemia   . Hypertension   . Skin melanoma ([place])   . Stroke [facility])     Past Surgical History:  Procedure Laterality Date  . Melenoma removal    . TONSILLECTOMY      There were no vitals filed for this visit.      Subjective Assessment - 09/10/16 0806    Subjective  I do get pain when I reach the limit that my arm will go   Patient is accompained by: Family member  dtr   Pertinent History hx of fall; L humerus fx 05/14/16 (s/p 8 wks at time of eval)--WBAT, ok for ROM (Dr. Tania Ade following); monitored for skin CA hx; hearing loss (particularly R side)   Patient Stated Goals return to being active   Currently in Pain? No/denies                      OT Treatments/Exercises (OP) - 09/10/16 0001      Shoulder Exercises: Supine   Other Supine Exercises Addressed bilateral overhead reach closed chain with one pound and then 2 pound weight - pt able to complete 12 reps x4 and gain to 110* of active shoulder flexoin in supine with pain at 2/10.  Transitioned into sitting and addressed  bliateral overhead reach closed chain no resistance  with min assist at end range.  Progressed to unilateral reach open chained in functional task and pt able to complete 105* of shoulder flexion with LUE in functional context.  Pt tolerated session well and stated "my arm feels less tight and better now,"     Manual Therapy   Manual Therapy Soft tissue mobilization;Myofascial release;Joint mobilization   Manual therapy comments joint, soft tissue mob and myofascial release to address tightness in L shoulder girdle, L upper quadrant prior to LUE exercises. Also utilized manual distraction with AAROM in supine to gain ROM prior to exercise.                    OT Short Term Goals - 09/10/16 0854      OT SHORT TERM GOAL #1   Title Pt will be independent with initial HEP--check STGs 08/11/16   Baseline Pt demonstrated I'ly in clinic and verbalized.   Time 4   Period Weeks   Status Achieved     OT SHORT TERM GOAL #2   Title Pt will improve coordination for ADLs as shown by improving time on 9-hole peg test by 5sec bilaterally.   Baseline R-32.87sec, L-42.41sec; 08/11/16 R= 28.17 sec, L= 31.69  sec   Time 4   Period Weeks   Status Achieved     OT SHORT TERM GOAL #3   Title Pt will demo at least 100* L shoulder flex for functional reaching/ADLs with 3/10 pain or less.   Baseline 90* @ Eval; 08/11/16 = 125* painfree   Time 4   Period Weeks   Status Achieved     OT SHORT TERM GOAL #4   Title Pt will improve L grip strength by at least 5lbs to assist with opening containers/ADLs.   Baseline 15lbs at Eval; 08/11/16 R= 48# vs L = 39#   Time 4   Period Weeks   Status Achieved     OT SHORT TERM GOAL #5   Title Pt will verbalize understanding of memory/cognitive compensation strategies for ADLs prn.   Time 4   Period Weeks   Status Achieved     OT SHORT TERM GOAL #6   Title Pt will perform simple snack prep/home maintenance task mod I.   Time 4   Period Weeks   Status Achieved           OT Long Term Goals - 09/10/16 0854       OT LONG TERM GOAL #1   Title Pt will be independent with updated HEP.--check LTGs 09/11/16   Time 8   Period Weeks   Status Achieved  09/08/16     OT LONG TERM GOAL #2   Title Pt will improve coordination for ADLs as shown by improving time on 9-hole peg test by 10sec with LUE.   Baseline 42.41sec   Time 8   Period Weeks   Status Achieved     OT LONG TERM GOAL #3   Title Pt will demo at least 110* L shoulder flex for functional reaching/ADLs with less than 2/10 pain.  Updated 08/12/16:  Pt will demo at least 135* L shoulder flex for functional reaching/ADLs with less than 2/10 pain.   Baseline 90*   Time 8   Period Weeks   Status On-going  08/12/16:  met initial goal and upgraded.   09/03/16:  120*     OT LONG TERM GOAL #4   Title Pt will improve L grip strength by at least 15lbs to assist with opening containers/ADLs.  Updated 08/12/16:  Pt will improve L grip strength to at least 42lbs to assist with opening containers/ADLs.   Baseline 15lbs   Time 8   Period Weeks   Status On-going  08/12/16  met initial goal and upgraded.  09/03/16  34lbs     OT LONG TERM GOAL #5   Title Pt will demo at least 100* L shoulder abduction for lateral reaching/ADLs with less than 3/10 pain.   Baseline 90*   Time 8   Period Weeks   Status Achieved  09/03/16:  115*     OT LONG TERM GOAL #6   Title Pt will perform simple cooking tasks/meal prep mod I.  08/12/16 updated:  Pt will perform mod complex cooking tasks/meal prep mod I.   Time 8   Period Weeks   Status On-going  08/12/16  met initial goal and upgraded.       OT LONG TERM GOAL #7   Title Pt will use LUE as nondominant assist for ADLs/IADLs at least 85% of the time   Time 8   Period Weeks   Status Achieved  08/12/16 per pt report  Plan - 09/10/16 0855    Clinical Impression Statement Pt progressing toward goals and gaining in overhead functional reach.   Rehab Potential Good   OT Frequency 2x / week   OT  Duration 8 weeks   OT Treatment/Interventions Self-care/ADL training;Cryotherapy;Parrafin;Therapeutic exercise;DME and/or AE instruction;Therapist, nutritional;Therapeutic activities;Patient/family education;Balance training;Cognitive remediation/compensation;Splinting;Manual Therapy;Neuromuscular education;Fluidtherapy;Ultrasound;Electrical Stimulation;Moist Heat;Energy conservation;Passive range of motion;Therapeutic exercises   Plan overhead reach with BUE's, functional use of BUE's    Consulted and Agree with Plan of Care Patient;Family member/caregiver   Family Member Consulted dtr-in-law Jarrett Soho)      Patient will benefit from skilled therapeutic intervention in order to improve the following deficits and impairments:  Decreased coordination, Decreased knowledge of use of DME, Decreased strength, Impaired UE functional use, Decreased activity tolerance, Decreased range of motion, Pain, Decreased cognition, Decreased balance, Decreased mobility  Visit Diagnosis: Stiffness of left shoulder, not elsewhere classified  Muscle weakness (generalized)  Acute pain of left shoulder  Other lack of coordination  Hemiplegia and hemiparesis following cerebral infarction affecting right dominant side ([facility])  Unsteadiness on feet  Other symptoms and signs involving cognitive functions following cerebral infarction  Attention and concentration deficit    Problem List Patient Active Problem List   Diagnosis Date Noted  . 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 Gait disturbance, post-stroke 07/21/2016  . Ankle contracture, right 07/21/2016  . Post-operative pain   . Benign essential HTN   . Urinary retention   . Closed fracture of left proximal humerus   . Left pontine cerebrovascular accident ([place]) 06/26/2016  . Stroke (cerebrum) ([place]) 06/23/2016  . Blood pressure elevated without history of HTN 06/23/2016  . Left pontine stroke ([place]) 06/23/2016  . Stroke-like symptoms   . Eye  movement disorder   . Closed fracture of neck of humerus with routine healing     Quay Burow, OTR/L 09/10/2016, 9:01 AM  [facility] [address] [place], [place], [address] Phone: [phone]   Fax:  [phone]  Name: Melanie Hendricks MRN: [id number] Date of Birth: 21-Aug-1936

## 2016-09-14 ENCOUNTER — Ambulatory Visit: Payer: Medicare Other | Admitting: Physical Therapy

## 2016-09-14 ENCOUNTER — Ambulatory Visit: Payer: Medicare Other | Admitting: Occupational Therapy

## 2016-09-14 ENCOUNTER — Encounter: Payer: Self-pay | Admitting: Physical Therapy

## 2016-09-14 DIAGNOSIS — R2689 Other abnormalities of gait and mobility: Secondary | ICD-10-CM | POA: Diagnosis not present

## 2016-09-14 DIAGNOSIS — M25612 Stiffness of left shoulder, not elsewhere classified: Secondary | ICD-10-CM

## 2016-09-14 DIAGNOSIS — I69351 Hemiplegia and hemiparesis following cerebral infarction affecting right dominant side: Secondary | ICD-10-CM

## 2016-09-14 DIAGNOSIS — R2681 Unsteadiness on feet: Secondary | ICD-10-CM

## 2016-09-14 DIAGNOSIS — R278 Other lack of coordination: Secondary | ICD-10-CM

## 2016-09-14 DIAGNOSIS — M6281 Muscle weakness (generalized): Secondary | ICD-10-CM

## 2016-09-14 DIAGNOSIS — M25512 Pain in left shoulder: Secondary | ICD-10-CM

## 2016-09-14 NOTE — Therapy (Signed)
[facility] [address] [place], [place], [address] Phone: [phone]   Fax:  [phone]  Occupational Therapy Treatment  Patient Details  Name: Melanie Hendricks MRN: [id number] Date of Birth: 21-Feb-1936 Referring Provider: Dr. Alysia Penna  Encounter Date: 09/14/2016      OT End of Session - 09/14/16 0809    Visit Number 17   Number of Visits 24  16+8=24   Date for OT Re-Evaluation 10/14/16   Authorization Type [employer] Medicare, no visit limit/auth; needs G-code   Authorization - Visit Number 1   Authorization - Number of Visits 10   OT Start Time 0804   OT Stop Time 0845   OT Time Calculation (min) 41 min   Activity Tolerance Patient tolerated treatment well   Behavior During Therapy [place] for tasks assessed/performed      Past Medical History:  Diagnosis Date  . Allergy   . Fx humeral neck   . Hepatitis   . Hyperlipidemia   . Hypertension   . Skin melanoma ([place])   . Stroke [facility])     Past Surgical History:  Procedure Laterality Date  . Melenoma removal    . TONSILLECTOMY      There were no vitals filed for this visit.      Subjective Assessment - 09/14/16 0808    Subjective  "It hurts, but just a little"  Pt reports exercises going well at home.   Patient is accompained by: Family member   Pertinent History hx of fall; L humerus fx 05/14/16 (s/p 8 wks at time of eval)--WBAT, ok for ROM (Dr. Tania Ade following); monitored for skin CA hx; hearing loss (particularly R side)   Limitations fall risk; L humerus fx 05/14/16 (s/p 8 wks at time of eval)--WBAT, ok for ROM (Dr. Tania Ade following), no driving, hearing loss   Patient Stated Goals return to being active   Currently in Pain? Yes   Pain Score 1    Pain Location Shoulder   Pain Orientation Left   Pain Descriptors / Indicators Aching   Pain Type Acute pain   Pain Onset More than a month ago   Pain Frequency Intermittent    Aggravating Factors  end range stretching   Pain Relieving Factors rest            [facility] OT Assessment - 09/14/16 0001      Precautions   Precaution Comments L shoulder cleared for activity as tolerated      In supine, shoulder flex with cane  for stretch.  Followed by shoulder abduction cane ex for stretch with LUE.  ER AROM stretch in supine bilaterally.   Wall slides for L shoulder flexion/abduction with min facilitation at scapula for incr mobility and min cues for compensation  Wall push ups x10 with min-mod difficulty for incr scapular strength, min facilitation/cues for positioning/compensation.  In supine, L shoulder flex and chest press with 1lb wt. x10 each.  Then shoulder flex in supine with 1lb ball with BUEs.  In sitting, using 1lb wt. In [place] for shoulder flex (to mid-range) and chest press with min facilitation/cues for positioning/compensation.  Pt reports incr pain today for unknown reason with initiating movement with resistance intermittently, improves with repetition and cues for positioning.  Gripper set on level 2 ([place]) to pick up blocks using sustained grip strength with min difficulty for all blocks without significant rest.  Arm bike x 30mn level 3 (forward/backwards) without rest for conditioning/reciprocal  movement.                       OT Short Term Goals - 09/10/16 0854      OT SHORT TERM GOAL #1   Title Pt will be independent with initial HEP--check STGs 08/11/16   Baseline Pt demonstrated I'ly in clinic and verbalized.   Time 4   Period Weeks   Status Achieved     OT SHORT TERM GOAL #2   Title Pt will improve coordination for ADLs as shown by improving time on 9-hole peg test by 5sec bilaterally.   Baseline R-32.87sec, L-42.41sec; 08/11/16 R= 28.17 sec, L= 31.69 sec   Time 4   Period Weeks   Status Achieved     OT SHORT TERM GOAL #3   Title Pt will demo at least 100* L shoulder flex for functional reaching/ADLs  with 3/10 pain or less.   Baseline 90* @ Eval; 08/11/16 = 125* painfree   Time 4   Period Weeks   Status Achieved     OT SHORT TERM GOAL #4   Title Pt will improve L grip strength by at least 5lbs to assist with opening containers/ADLs.   Baseline 15lbs at Eval; 08/11/16 R= 48# vs L = 39#   Time 4   Period Weeks   Status Achieved     OT SHORT TERM GOAL #5   Title Pt will verbalize understanding of memory/cognitive compensation strategies for ADLs prn.   Time 4   Period Weeks   Status Achieved     OT SHORT TERM GOAL #6   Title Pt will perform simple snack prep/home maintenance task mod I.   Time 4   Period Weeks   Status Achieved           OT Long Term Goals - 09/14/16 0811      OT LONG TERM GOAL #1   Title Pt will be independent with updated HEP.--check LTGs 09/11/16   Time 8   Period Weeks   Status Achieved  09/08/16     OT LONG TERM GOAL #2   Title Pt will improve coordination for ADLs as shown by improving time on 9-hole peg test by 10sec with LUE.   Baseline 42.41sec   Time 8   Period Weeks   Status Achieved     OT LONG TERM GOAL #3   Title Pt will demo at least 110* L shoulder flex for functional reaching/ADLs with less than 2/10 pain.  Updated 08/12/16:  Pt will demo at least 135* L shoulder flex for functional reaching/ADLs with less than 2/10 pain.   Baseline 90*   Time 8   Period Weeks   Status On-going  08/12/16:  met initial goal and upgraded.   09/03/16:  120*     OT LONG TERM GOAL #4   Title Pt will improve L grip strength by at least 15lbs to assist with opening containers/ADLs.  Updated 08/12/16:  Pt will improve L grip strength to at least 42lbs to assist with opening containers/ADLs.   Baseline 15lbs   Time 8   Period Weeks   Status On-going  08/12/16  met initial goal and upgraded.  09/03/16  34lbs     OT LONG TERM GOAL #5   Title Pt will demo at least 100* L shoulder abduction for lateral reaching/ADLs with less than 3/10 pain.   Baseline 90*    Time 8   Period Weeks   Status Achieved  09/03/16:  115*     OT LONG TERM GOAL #6   Title Pt will perform simple cooking tasks/meal prep mod I.  08/12/16 updated:  Pt will perform mod complex cooking tasks/meal prep mod I.   Time 8   Period Weeks   Status On-going  08/12/16  met initial goal and upgraded.       OT LONG TERM GOAL #7   Title Pt will use LUE as nondominant assist for ADLs/IADLs at least 85% of the time   Time 8   Period Weeks   Status Achieved  08/12/16 per pt report     OT LONG TERM GOAL #8   Title Pt will demo at least 120* L shoulder abduction for lateral reaching/ADLs with less than 3/10 pain.   Time 4   Period Weeks   Status New               Plan - September 20, 2016 0813    Clinical Impression Statement Pt is progressing well.  Pt met all initial LTGs, but will benefit from continued occupational therapy to address updated goals and improve UE functional use for ADLs/IADLs.   Rehab Potential Good   OT Frequency 2x / week  renewal completed September 21, 2015   OT Duration 4 weeks   OT Treatment/Interventions Self-care/ADL training;Cryotherapy;Parrafin;Therapeutic exercise;DME and/or AE instruction;Therapist, nutritional;Therapeutic activities;Patient/family education;Balance training;Cognitive remediation/compensation;Splinting;Manual Therapy;Neuromuscular education;Fluidtherapy;Ultrasound;Electrical Stimulation;Moist Heat;Energy conservation;Passive range of motion;Therapeutic exercises   Plan continue with overhead reach, strengthening   OT Home Exercise Plan Education provided:  Reviewed HEP for LUE ROM from hospital (pt to continue), coordination HEP upgraded putty to red; yellow theraband HEP (shoulder abduction, ext, flex, horizontal abduction); In shoulder flex, chest press, ER, horizontal abduction, ext with 1lb wt    Consulted and Agree with Plan of Care Patient;Family member/caregiver   Family Member Consulted dtr-in-law Jarrett Soho)      Patient will  benefit from skilled therapeutic intervention in order to improve the following deficits and impairments:  Decreased coordination, Decreased knowledge of use of DME, Decreased strength, Impaired UE functional use, Decreased activity tolerance, Decreased range of motion, Pain, Decreased cognition, Decreased balance, Decreased mobility  Visit Diagnosis: Muscle weakness (generalized)  Stiffness of left shoulder, not elsewhere classified  Acute pain of left shoulder  Other lack of coordination  Hemiplegia and hemiparesis following cerebral infarction affecting right dominant side (HCC)  Unsteadiness on feet      G-Codes - 2016/09/20 0835    Functional Assessment Tool Used L shoulder AROM 125* AROM shoulder flex;  L shoulder abduction 115*, L grip strength 34lbs   Functional Limitation Carrying, moving and handling objects   Carrying, Moving and Handling Objects Current Status (L8937) At least 20 percent but less than 40 percent impaired, limited or restricted   Carrying, Moving and Handling Objects Goal Status (D4287) At least 1 percent but less than 20 percent impaired, limited or restricted      Problem List Patient Active Problem List   Diagnosis Date Noted  . 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 Gait disturbance, post-stroke 07/21/2016  . Ankle contracture, right 07/21/2016  . Post-operative pain   . Benign essential HTN   . Urinary retention   . Closed fracture of left proximal humerus   . Left pontine cerebrovascular accident ([place]) 06/26/2016  . Stroke (cerebrum) ([place]) 06/23/2016  . Blood pressure elevated without history of HTN 06/23/2016  . Left pontine stroke ([place]) 06/23/2016  . Stroke-like symptoms   . Eye movement disorder   . Closed  fracture of neck of humerus with routine healing     Occupational Therapy Progress Note  Dates of Reporting Period: 08/11/16 to 09/14/16  Objective Reports of Subjective Statement: see above  Objective Measurements: see  above  Goal Update: see above  Plan: see above  Reason Skilled Services are Required: see above     [facility] 09/14/2016, 4:06 PM  [facility] [address] [place], [place], [address] Phone: [phone]   Fax:  [phone]  Name: Melanie Hendricks MRN: [id number] Date of Birth: 1936-09-01   Vianne Bulls, OTR/L [facility] [address] [place], [place]  [address] [phone] phone [phone] 09/14/16 4:07 PM

## 2016-09-14 NOTE — Therapy (Signed)
[facility] [address] [place], [place], [address] Phone: [phone]   Fax:  [phone]  Physical Therapy Treatment  Patient Details  Name: Melanie Hendricks MRN: [id number] Date of Birth: 1936-02-16 Referring Provider: Ella Bodo  Encounter Date: 09/14/2016      PT End of Session - 09/14/16 0849    Visit Number 17   Number of Visits 24   Date for PT Re-Evaluation 10/11/16   Authorization Type Medicare, G-Code every 10th visit   PT Start Time 0848   PT Stop Time 0930   PT Time Calculation (min) 42 min   Equipment Utilized During Treatment Gait belt      Past Medical History:  Diagnosis Date  . Allergy   . Fx humeral neck   . Hepatitis   . Hyperlipidemia   . Hypertension   . Skin melanoma ([place])   . Stroke [facility])     Past Surgical History:  Procedure Laterality Date  . Melenoma removal    . TONSILLECTOMY      There were no vitals filed for this visit.      Subjective Assessment - 09/14/16 0848    Subjective No new complaints. No falls. Some some left shoulder stiffness from using it with OT session prior to PT session   Patient is accompained by: Family member   Pertinent History Had CVA last month, completed inpatient rehab just prior to OPPT   Limitations Standing;Walking;Other (comment);House hold activities   Patient Stated Goals To return to usual self, driving, gardening, mowing yard, laundry, etc   Currently in Pain? No/denies   Pain Score 0-No pain             [facility] Adult PT Treatment/Exercise - 09/14/16 0852      Transfers   Transfers Sit to Stand;Stand to Sit   Sit to Stand 5: Supervision;With upper extremity assist;From chair/3-in-1   Stand to Sit 5: Supervision;With upper extremity assist;To chair/3-in-1     Ambulation/Gait   Ambulation/Gait Yes   Ambulation/Gait Assistance 5: Supervision   Ambulation Distance (Feet) 650 Feet   Assistive device None   Gait Pattern  Step-through pattern;Decreased stride length;Decreased arm swing - right;Decreased arm swing - left;Right flexed knee in stance;Left flexed knee in stance;Decreased trunk rotation;Wide base of support   Ambulation Surface Level;Indoor   Gait velocity 10.97 sec's= 3.0 ft/sec     Dynamic Gait Index   Level Surface Normal   Change in Gait Speed Normal   Gait with Horizontal Head Turns Mild Impairment   Gait with Vertical Head Turns Mild Impairment   Gait and Pivot Turn Mild Impairment   Step Over Obstacle Moderate Impairment   Step Around Obstacles Mild Impairment   Steps Mild Impairment   Total Score 17             Balance Exercises - 09/14/16 0937      Balance Exercises: Standing   Rockerboard Anterior/posterior;Lateral;Head turns;EC;EO;Other time (comment);10 reps   Balance Beam standing across blue foam balance beam: alternating fwd stepping to floor and back onto beam x 10 reps each LE, alternating bwd stepping to floor and back onto beam x 10 reps each LE, min guard to min assist with intermittent UE touch to bars for balance. cues on posture and weight shifting to assist with balance.     Balance Exercises: Standing   Rebounder Limitations performed both ways on large balance board with no UE support: EO rocking board with emphasis on tall posture;  holding board steady EC no head movements, EO head movements up<>down and left<>right, progressing to EC head movements up<>down and left<>right, min guard to min assist for balance with cues on posture and weight shifting to assist with balance.                           PT Short Term Goals - 09/10/16 1618      PT SHORT TERM GOAL #1   Title Patient will demonstrate decreased fall risk with Berg balance score 45/56 or greater. (all STGs due 08/10/2016)   Baseline 08/11/2016 Scored 46/56 today   Status Achieved     PT SHORT TERM GOAL #2   Title Patient to complete TUG and set goal as appropriate for fall risk reduction.    Baseline Tug performed today, completed in 17.49 sec. Was previously performed on 11/16 in 16.87 sec. No goal has been set   Status On-going     PT [place] #3   Title Patient to verbalize CVA warning signs and risk factors with modifications for prevention.    Baseline Patient provided with stroke prevention handout today.   Status On-going     PT SHORT TERM GOAL #4   Title Patient to ambulate indoor surfaces with cane and S over 300' no LOB.    Baseline 08/11/16 Met Patient ambulated 345 with S today   Status Achieved           PT Long Term Goals - 09/14/16 0850      PT LONG TERM GOAL #1   Title Patient to demonstrate decreased fall risk with Gait velocity >0.8 m/s (all LTGs due 09/04/16) = 2.62 ft/sec   New Target date 10-11-16   Baseline 09/14/16: 10/10.97 sec's= 0.91 m/s, 3.0 ft./sec   Time --   Period --   Status Achieved     PT LONG TERM GOAL #2   Title Patient to be independent with HEP for balance and strength and walking program.   Baseline met 09-03-16   Status Achieved     PT LONG TERM GOAL #3   Title Patient to ambulate indoors without device independent 300' no LOB   Baseline 09/14/16: met in clinic today   Status Achieved     PT LONG TERM GOAL #4   Title Patient to ambulate outdoors with cane and no LOB 500' paved and grassy surfaces.    Baseline met 08-27-16   Status Achieved     PT LONG TERM GOAL #5   Title Independent in updated HEP, progress from red to green theraband for hip strengthening exs. and balance exs as appropriate.  10-11-16   Time 4   Period Weeks   Status On-going     PT LONG TERM GOAL #6   Title Increase DGI by at least 4 points to increase safety with gait.  10-11-16   Baseline 09/14/16: baseline 17/24   Time 4   Period Weeks   Status On-going     PT LONG TERM GOAL #7   Title Amb. with supervision 200' outside on paved surface without cane .  10-11-16   Time 4   Period Weeks   Status On-going            Plan - 09/14/16  0849    Clinical Impression Statement Today's skilled session established baseline DGI values and remainder of session continued to address high level balance deficits without complaints. Pt should benefit from continued  PT to progress toward unmet goals.    Rehab Potential Good   PT Frequency 2x / week   PT Duration 4 weeks   PT Treatment/Interventions ADLs/Self Care Home Management;Functional mobility training;Stair training;Gait training;DME Instruction;Therapeutic activities;Therapeutic exercise;Neuromuscular re-education;Patient/family education;Balance training;Vestibular   PT Next Visit Plan gait train with/without SPC - cont high level balance/gait activities;   PT Home Exercise Plan added heel raises in standing, dorsiflexion with theraband and amb. on tip toes and heels to HEP   Consulted and Agree with Plan of Care Patient;Family member/caregiver   Family Member Consulted daughter in law - Jarrett Soho      Patient will benefit from skilled therapeutic intervention in order to improve the following deficits and impairments:  Abnormal gait, Impaired UE functional use, Decreased safety awareness, Decreased endurance, Decreased activity tolerance, Decreased balance, Decreased mobility, Decreased strength, Decreased knowledge of use of DME, Pain, Decreased cognition  Visit Diagnosis: Muscle weakness (generalized)  Unsteadiness on feet  Other abnormalities of gait and mobility     Problem List Patient Active Problem List   Diagnosis Date Noted  . 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 Gait disturbance, post-stroke 07/21/2016  . Ankle contracture, right 07/21/2016  . Post-operative pain   . Benign essential HTN   . Urinary retention   . Closed fracture of left proximal humerus   . Left pontine cerebrovascular accident ([place]) 06/26/2016  . Stroke (cerebrum) ([place]) 06/23/2016  . Blood pressure elevated without history of HTN 06/23/2016  . Left pontine stroke ([place]) 06/23/2016  .  Stroke-like symptoms   . Eye movement disorder   . Closed fracture of neck of humerus with routine healing     Willow Ora, PTA, [facility] [address], [place], [place] [address] [phone] 09/14/16, 12:19 PM   Name: Melanie Hendricks MRN: [id number] Date of Birth: [birth year]

## 2016-09-15 ENCOUNTER — Telehealth: Payer: Self-pay | Admitting: Family

## 2016-09-15 NOTE — Telephone Encounter (Signed)
Pt daughter called in said that they are wanting a bp monitor.  Can this be sent in to pharmacy   [facility] in [place]

## 2016-09-16 ENCOUNTER — Ambulatory Visit: Payer: Medicare Other | Admitting: Occupational Therapy

## 2016-09-16 ENCOUNTER — Encounter: Payer: Self-pay | Admitting: Physical Therapy

## 2016-09-16 ENCOUNTER — Ambulatory Visit: Payer: Medicare Other | Admitting: Physical Therapy

## 2016-09-16 DIAGNOSIS — M25612 Stiffness of left shoulder, not elsewhere classified: Secondary | ICD-10-CM

## 2016-09-16 DIAGNOSIS — R2689 Other abnormalities of gait and mobility: Secondary | ICD-10-CM | POA: Diagnosis not present

## 2016-09-16 DIAGNOSIS — I69351 Hemiplegia and hemiparesis following cerebral infarction affecting right dominant side: Secondary | ICD-10-CM

## 2016-09-16 DIAGNOSIS — M6281 Muscle weakness (generalized): Secondary | ICD-10-CM

## 2016-09-16 DIAGNOSIS — M25512 Pain in left shoulder: Secondary | ICD-10-CM

## 2016-09-16 DIAGNOSIS — R278 Other lack of coordination: Secondary | ICD-10-CM

## 2016-09-16 DIAGNOSIS — R2681 Unsteadiness on feet: Secondary | ICD-10-CM

## 2016-09-16 NOTE — Patient Instructions (Addendum)
Partially reviewed patient's HEP and asked patient to bring in her exercise sheets for thorough review and upgrade as approp

## 2016-09-16 NOTE — Therapy (Signed)
[facility] [address] [place], [place], [address] Phone: [phone]   Fax:  [phone]  Occupational Therapy Treatment  Patient Details  Name: Melanie Hendricks MRN: [id number] Date of Birth: [birth year] Referring Provider: Dr. Alysia Penna  Encounter Date: 09/16/2016      OT End of Session - 09/16/16 0814    Visit Number 18   Number of Visits 24   Date for OT Re-Evaluation 10/14/16   Authorization - Visit Number 2   Authorization - Number of Visits 10   OT Start Time 0804   OT Stop Time 0845   OT Time Calculation (min) 41 min   Activity Tolerance Patient tolerated treatment well   Behavior During Therapy [facility] for tasks assessed/performed      Past Medical History:  Diagnosis Date  . Allergy   . Fx humeral neck   . Hepatitis   . Hyperlipidemia   . Hypertension   . Skin melanoma ([place])   . Stroke [facility])     Past Surgical History:  Procedure Laterality Date  . Melenoma removal    . TONSILLECTOMY      There were no vitals filed for this visit.      Subjective Assessment - 09/16/16 0809    Pertinent History hx of fall; L humerus fx 05/14/16 (s/p 8 wks at time of eval)--WBAT, ok for ROM (Dr. Tania Ade following); monitored for skin CA hx; hearing loss (particularly R side)   Limitations fall risk; L humerus fx 05/14/16 (s/p 8 wks at time of eval)--WBAT, ok for ROM (Dr. Tania Ade following), no driving, hearing loss   Patient Stated Goals return to being active   Currently in Pain? Yes   Pain Score 1    Pain Location Shoulder   Pain Orientation Left   Pain Descriptors / Indicators Aching   Pain Type Acute pain   Pain Onset More than a month ago   Pain Frequency Intermittent   Aggravating Factors  end range stretching   Pain Relieving Factors rest   Multiple Pain Sites No        In supine, shoulder flex with cane  for stretch.  Followed by shoulder abduction cane ex for stretch  with LUE.  ER AROM stretch in supine bilaterally with cane  In supine, L shoulder flex and chest press with 1lb wt. x10 each.  Then shoulder flex in supine with 1lb ball with BUEs.  In sitting, using 1lb wt. In RUEs for shoulder flex (to mid-range)  with min facilitation/cues for positioning/compensation.  Standing to copy small peg design with bilateral UE's  For mid- high range reach and fine motor coordination, min difficulty drops  Arm bike x 5 min level 3 (forward/backwards) without rest for conditioning/reciprocal movement.                             OT Short Term Goals - 09/10/16 0854      OT SHORT TERM GOAL #1   Title Pt will be independent with initial HEP--check STGs 08/11/16   Baseline Pt demonstrated I'ly in clinic and verbalized.   Time 4   Period Weeks   Status Achieved     OT SHORT TERM GOAL #2   Title Pt will improve coordination for ADLs as shown by improving time on 9-hole peg test by 5sec bilaterally.   Baseline R-32.87sec, L-42.41sec; 08/11/16 R= 28.17 sec, L= 31.69 sec   Time  4   Period Weeks   Status Achieved     OT SHORT TERM GOAL #3   Title Pt will demo at least 100* L shoulder flex for functional reaching/ADLs with 3/10 pain or less.   Baseline 90* @ Eval; 08/11/16 = 125* painfree   Time 4   Period Weeks   Status Achieved     OT SHORT TERM GOAL #4   Title Pt will improve L grip strength by at least 5lbs to assist with opening containers/ADLs.   Baseline 15lbs at Eval; 08/11/16 R= 48# vs L = 39#   Time 4   Period Weeks   Status Achieved     OT SHORT TERM GOAL #5   Title Pt will verbalize understanding of memory/cognitive compensation strategies for ADLs prn.   Time 4   Period Weeks   Status Achieved     OT SHORT TERM GOAL #6   Title Pt will perform simple snack prep/home maintenance task mod I.   Time 4   Period Weeks   Status Achieved           OT Long Term Goals - 09/14/16 0811      OT LONG TERM GOAL #1    Title Pt will be independent with updated HEP.--check LTGs 09/11/16   Time 8   Period Weeks   Status Achieved  09/08/16     OT LONG TERM GOAL #2   Title Pt will improve coordination for ADLs as shown by improving time on 9-hole peg test by 10sec with LUE.   Baseline 42.41sec   Time 8   Period Weeks   Status Achieved     OT LONG TERM GOAL #3   Title Pt will demo at least 110* L shoulder flex for functional reaching/ADLs with less than 2/10 pain.  Updated 08/12/16:  Pt will demo at least 135* L shoulder flex for functional reaching/ADLs with less than 2/10 pain.   Baseline 90*   Time 8   Period Weeks   Status On-going  08/12/16:  met initial goal and upgraded.   09/03/16:  120*     OT LONG TERM GOAL #4   Title Pt will improve L grip strength by at least 15lbs to assist with opening containers/ADLs.  Updated 08/12/16:  Pt will improve L grip strength to at least 42lbs to assist with opening containers/ADLs.   Baseline 15lbs   Time 8   Period Weeks   Status On-going  08/12/16  met initial goal and upgraded.  09/03/16  34lbs     OT LONG TERM GOAL #5   Title Pt will demo at least 100* L shoulder abduction for lateral reaching/ADLs with less than 3/10 pain.   Baseline 90*   Time 8   Period Weeks   Status Achieved  09/03/16:  115*     OT LONG TERM GOAL #6   Title Pt will perform simple cooking tasks/meal prep mod I.  08/12/16 updated:  Pt will perform mod complex cooking tasks/meal prep mod I.   Time 8   Period Weeks   Status On-going  08/12/16  met initial goal and upgraded.       OT LONG TERM GOAL #7   Title Pt will use LUE as nondominant assist for ADLs/IADLs at least 85% of the time   Time 8   Period Weeks   Status Achieved  08/12/16 per pt report     OT LONG TERM GOAL #8   Title Pt will demo  at least 120* L shoulder abduction for lateral reaching/ADLs with less than 3/10 pain.   Time 4   Period Weeks   Status New             Patient will benefit from skilled  therapeutic intervention in order to improve the following deficits and impairments:     Visit Diagnosis: Muscle weakness (generalized)  Stiffness of left shoulder, not elsewhere classified  Acute pain of left shoulder  Other lack of coordination  Hemiplegia and hemiparesis following cerebral infarction affecting right dominant side [facility])    Problem List Patient Active Problem List   Diagnosis Date Noted  . 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 Gait disturbance, post-stroke 07/21/2016  . Ankle contracture, right 07/21/2016  . Post-operative pain   . Benign essential HTN   . Urinary retention   . Closed fracture of left proximal humerus   . Left pontine cerebrovascular accident ([place]) 06/26/2016  . Stroke (cerebrum) ([place]) 06/23/2016  . Blood pressure elevated without history of HTN 06/23/2016  . Left pontine stroke ([place]) 06/23/2016  . Stroke-like symptoms   . Eye movement disorder   . Closed fracture of neck of humerus with routine healing     Harvir Patry 09/16/2016, 8:35 AM  [facility] [address] [place], [place], [address] Phone: [phone]   Fax:  [phone]  Name: Melanie Hendricks MRN: [id number] Date of Birth: May 31, 1936

## 2016-09-16 NOTE — Telephone Encounter (Signed)
Written prescription completed to be faxed.

## 2016-09-16 NOTE — Telephone Encounter (Signed)
Rx has been faxed to pharmacy.

## 2016-09-16 NOTE — Therapy (Signed)
[facility] [address] [place], [place], [address] Phone: [phone]   Fax:  [phone]  Physical Therapy Treatment  Patient Details  Name: Melanie Hendricks MRN: [id number] Date of Birth: 04-Mar-1936 Referring Provider: Larna Daughters  Encounter Date: 09/16/2016      PT End of Session - 09/16/16 1248    Visit Number 18   Number of Visits 24   Date for PT Re-Evaluation 10/11/16   Authorization Type Medicare, G-Code every 10th visit   PT Start Time 0847   PT Stop Time 0934   PT Time Calculation (min) 47 min   Equipment Utilized During Treatment Gait belt   Activity Tolerance Patient tolerated treatment well   Behavior During Therapy [facility] for tasks assessed/performed      Past Medical History:  Diagnosis Date  . Allergy   . Fx humeral neck   . Hepatitis   . Hyperlipidemia   . Hypertension   . Skin melanoma ([facility])   . Stroke [facility])     Past Surgical History:  Procedure Laterality Date  . Melenoma removal    . TONSILLECTOMY      There were no vitals filed for this visit.      Subjective Assessment - 09/16/16 0850    Subjective No new complaints. No falls. Reports no longer using cane inside home; continues to use in community (although did initially leave house today without cane and had to retrieve it).    Patient is accompained by: Family member   Pertinent History Had Lt pontine CVA 06/23/16, completed inpatient rehab just prior to OPPT   Limitations Standing;Walking;Other (comment);House hold activities   Patient Stated Goals To return to usual self, driving, gardening, mowing yard, laundry, etc   Pain Score 1    Pain Location Arm   Pain Orientation Left   Pain Descriptors / Indicators Aching;Discomfort   Pain Type Acute pain   Pain Onset More than a month ago   Pain Frequency Intermittent   Aggravating Factors  had OT session just prior to PT session   Pain Relieving Factors rest   Multiple Pain  Sites No                         [facility] Adult PT Treatment/Exercise - 09/16/16 1231      Transfers   Transfers Sit to Stand;Stand to Sit   Sit to Stand 5: Supervision;With upper extremity assist;From chair/3-in-1   Stand to Sit 5: Supervision;With upper extremity assist;To chair/3-in-1     Ambulation/Gait   Ambulation/Gait Yes   Ambulation/Gait Assistance 5: Supervision   Ambulation Distance (Feet) 400 Feet   Assistive device None   Gait Pattern Step-through pattern;Decreased stride length;Decreased arm swing - right;Decreased arm swing - left;Right flexed knee in stance;Left flexed knee in stance;Decreased trunk rotation;Wide base of support   Ambulation Surface Level;Indoor     Neuro Re-ed    Neuro Re-ed Details  standing in // bars with intermittent UE support (typically one finger) standing cross-wise on blue balance beam x1 minute EO, 1 minute EC (no UE support); EO head turns horizontal and vertical x 10. Pt with significant imbalance with activities using ankle & hip strategy; RLE weakness evident with knee instability. Step ups forward onto blue balance beam using RLE for ascend and descend with single UE support. Ambulation x 10 ft on compliant red mat x 10 reps (with varying paths) with 25 lb, and 5 lb x2 ankle weights  under mat for uneven surface. Patient very cautious with very slow gait (often step-to). Emphasized step on/off mat using RLE for strengthening.      Exercises   Exercises Ankle     Ankle Exercises: Seated   Toe Raise Other (comment)  seated Rt DF with green band (lt foot anchors band) x 3 min                PT Education - 09/16/16 1246    Education provided Yes   Education Details Educated on proper form/technique with toe and heel walking at counter; ankle DF with theraband exercise issued last visit.   Person(s) Educated Patient;Child(ren)   Methods Explanation;Demonstration;Handout   Comprehension Verbalized  understanding;Returned demonstration;Need further instruction          PT Short Term Goals - 09/10/16 1618      PT SHORT TERM GOAL #1   Title Patient will demonstrate decreased fall risk with Berg balance score 45/56 or greater. (all STGs due 08/10/2016)   Baseline 08/11/2016 Scored 46/56 today   Status Achieved     PT SHORT TERM GOAL #2   Title Patient to complete TUG and set goal as appropriate for fall risk reduction.   Baseline Tug performed today, completed in 17.49 sec. Was previously performed on 11/16 in 16.87 sec. No goal has been set   Status On-going     PT [place] #3   Title Patient to verbalize CVA warning signs and risk factors with modifications for prevention.    Baseline Patient provided with stroke prevention handout today.   Status On-going     PT SHORT TERM GOAL #4   Title Patient to ambulate indoor surfaces with cane and S over 300' no LOB.    Baseline 08/11/16 Met Patient ambulated 345 with S today   Status Achieved           PT Long Term Goals - 09/14/16 0850      PT LONG TERM GOAL #1   Title Patient to demonstrate decreased fall risk with Gait velocity >0.8 m/s (all LTGs due 09/04/16) = 2.62 ft/sec   New Target date 10-11-16   Baseline 09/14/16: 10/10.97 sec's= 0.91 m/s, 3.0 ft./sec   Time --   Period --   Status Achieved     PT LONG TERM GOAL #2   Title Patient to be independent with HEP for balance and strength and walking program.   Baseline met 09-03-16   Status Achieved     PT LONG TERM GOAL #3   Title Patient to ambulate indoors without device independent 300' no LOB   Baseline 09/14/16: met in clinic today   Status Achieved     PT LONG TERM GOAL #4   Title Patient to ambulate outdoors with cane and no LOB 500' paved and grassy surfaces.    Baseline met 08-27-16   Status Achieved     PT LONG TERM GOAL #5   Title Independent in updated HEP, progress from red to green theraband for hip strengthening exs. and balance exs as  appropriate.  10-11-16   Time 4   Period Weeks   Status On-going     PT LONG TERM GOAL #6   Title Increase DGI by at least 4 points to increase safety with gait.  10-11-16   Baseline 09/14/16: baseline 17/24   Time 4   Period Weeks   Status On-going     PT LONG TERM GOAL #7   Title Amb. with  supervision 200' outside on paved surface without cane .  10-11-16   Time 4   Period Weeks   Status On-going               Plan - 09/16/16 1249    Clinical Impression Statement Ambulation with no device on level surface noted to have increased arm swing and trunk rotation (although still decreased). Patient highly motivated and reports she does all home exercises however cannot recall exercises without her handouts. Asked pt to bring handouts to next session for review/update. Emphasis today on walking on uneven, compliant surface (simulate uneven ground) and noted extreme decrease in velocity and overall gait quality with patient very cautious in her movements.   Rehab Potential Good   PT Frequency 2x / week   PT Duration 4 weeks   PT Treatment/Interventions DME Instruction;Gait training;Stair training;Functional mobility training;Therapeutic activities;Therapeutic exercise;Balance training;Neuromuscular re-education;Cognitive remediation;Patient/family education   PT Next Visit Plan if pt brings HEP handouts, review/remove/update HEP; high level balance/gait with goal of outdoor ambulation    Consulted and Agree with Plan of Care Patient;Family member/caregiver   Family Member Consulted daughter in law - Jarrett Soho      Patient will benefit from skilled therapeutic intervention in order to improve the following deficits and impairments:  Abnormal gait, Decreased balance, Decreased cognition, Decreased mobility, Decreased strength, Decreased knowledge of use of DME  Visit Diagnosis: Unsteadiness on feet  Hemiplegia and hemiparesis following cerebral infarction affecting right dominant side  [facility])     Problem List Patient Active Problem List   Diagnosis Date Noted  . 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 Gait disturbance, post-stroke 07/21/2016  . Ankle contracture, right 07/21/2016  . Post-operative pain   . Benign essential HTN   . Urinary retention   . Closed fracture of left proximal humerus   . Left pontine cerebrovascular accident ([place]) 06/26/2016  . Stroke (cerebrum) ([place]) 06/23/2016  . Blood pressure elevated without history of HTN 06/23/2016  . Left pontine stroke ([place]) 06/23/2016  . Stroke-like symptoms   . Eye movement disorder   . Closed fracture of neck of humerus with routine healing     Rexanne Mano, PT 09/16/2016, 12:59 PM  [facility] [address] [place], [place], [address] Phone: [phone]   Fax:  [phone]  Name: Melanie Hendricks MRN: [id number] Date of Birth: [birth year]

## 2016-09-17 NOTE — Telephone Encounter (Signed)
Re-faxed.

## 2016-09-17 NOTE — Telephone Encounter (Signed)
Daughter called back stating pharmacy did not get fax.  Please resend.

## 2016-09-22 ENCOUNTER — Ambulatory Visit: Payer: Self-pay | Admitting: Neurology

## 2016-09-23 ENCOUNTER — Ambulatory Visit: Payer: Medicare Other | Admitting: Physical Therapy

## 2016-09-23 ENCOUNTER — Ambulatory Visit: Payer: Medicare Other | Admitting: Occupational Therapy

## 2016-09-25 ENCOUNTER — Encounter: Payer: Medicare Other | Admitting: Occupational Therapy

## 2016-09-25 ENCOUNTER — Ambulatory Visit: Payer: Medicare Other | Admitting: Physical Therapy

## 2016-09-28 ENCOUNTER — Ambulatory Visit: Payer: Medicare Other | Admitting: Physical Therapy

## 2016-09-28 ENCOUNTER — Ambulatory Visit: Payer: Medicare Other | Admitting: Occupational Therapy

## 2016-09-28 DIAGNOSIS — R269 Unspecified abnormalities of gait and mobility: Secondary | ICD-10-CM

## 2016-09-28 DIAGNOSIS — I69351 Hemiplegia and hemiparesis following cerebral infarction affecting right dominant side: Secondary | ICD-10-CM

## 2016-09-28 DIAGNOSIS — M25512 Pain in left shoulder: Secondary | ICD-10-CM

## 2016-09-28 DIAGNOSIS — M25612 Stiffness of left shoulder, not elsewhere classified: Secondary | ICD-10-CM

## 2016-09-28 DIAGNOSIS — R2689 Other abnormalities of gait and mobility: Secondary | ICD-10-CM | POA: Diagnosis not present

## 2016-09-28 DIAGNOSIS — R278 Other lack of coordination: Secondary | ICD-10-CM

## 2016-09-28 DIAGNOSIS — M6281 Muscle weakness (generalized): Secondary | ICD-10-CM

## 2016-09-28 NOTE — Patient Instructions (Signed)
[place]    Using support/counter, cross one foot over other. Then bring back foot up beside front foot. Repeat, until you reach the end of the counter. Repeat going the other direction, crossing other leg in front.  Then repeat crossing leg/foot behind. Repeat each direction.  Repeat each direction front x 5 times. Do __1__ sessions per day.  http://gt2.exer.us/532   Copyright  VHI. All rights reserved.

## 2016-09-28 NOTE — Therapy (Signed)
[facility] [address] [place], [place], [address] Phone: [phone]   Fax:  [phone]  Occupational Therapy Treatment  Patient Details  Name: Melanie Hendricks MRN: [id number] Date of Birth: December 05, 1935 Referring Provider: Dr. Alysia Penna  Encounter Date: 09/28/2016      OT End of Session - 09/28/16 0821    Visit Number 19   Number of Visits 24   Date for OT Re-Evaluation 10/14/16   Authorization - Visit Number 3   Authorization - Number of Visits 10   OT Start Time 0808  pt arrived late   OT Stop Time 0846   OT Time Calculation (min) 38 min   Activity Tolerance Patient tolerated treatment well   Behavior During Therapy [facility] for tasks assessed/performed      Past Medical History:  Diagnosis Date  . Allergy   . Fx humeral neck   . Hepatitis   . Hyperlipidemia   . Hypertension   . Skin melanoma ([place])   . Stroke [facility])     Past Surgical History:  Procedure Laterality Date  . Melenoma removal    . TONSILLECTOMY      There were no vitals filed for this visit.      Subjective Assessment - 09/28/16 0821    Subjective  Pt reports exercises going well at home, but has had incr stiffness in back the last few days   Pertinent History hx of fall; L humerus fx 05/14/16 (s/p 8 wks at time of eval)--WBAT, ok for ROM (Dr. Tania Ade following); monitored for skin CA hx; hearing loss (particularly R side)   Limitations fall risk; L humerus fx 05/14/16 (s/p 8 wks at time of eval)--WBAT, ok for ROM (Dr. Tania Ade following), no driving, hearing loss   Patient Stated Goals return to being active   Currently in Pain? No/denies        Picking up blocks with LUE using gripper set on level 2 for incr sustained grip strength ([place]) with min difficulty.  Reviewed L shoulder HEP with 1lb wt. x15 each  (supine shoulder flex and chest press; sidelying ER and horizontal abduction; standing shoulder  ext/scapular retraction)  In standing, L functional reaching to place large pegs in vertical pegboard with min difficulty/compensation and min cues for posture.  L shoulder flex 130-135*   Discussed anticipated d/c next week and ways to progress HEP after d/c (incr reps to 25 prior to incr resistance/weight, continue HEP for approx 2 months or until easy, continue to perform cane HEP for stiffness after that prn).  Pt verbalized understanding.                        OT Short Term Goals - 09/10/16 0854      OT SHORT TERM GOAL #1   Title Pt will be independent with initial HEP--check STGs 08/11/16   Baseline Pt demonstrated I'ly in clinic and verbalized.   Time 4   Period Weeks   Status Achieved     OT SHORT TERM GOAL #2   Title Pt will improve coordination for ADLs as shown by improving time on 9-hole peg test by 5sec bilaterally.   Baseline R-32.87sec, L-42.41sec; 08/11/16 R= 28.17 sec, L= 31.69 sec   Time 4   Period Weeks   Status Achieved     OT SHORT TERM GOAL #3   Title Pt will demo at least 100* L shoulder flex for functional reaching/ADLs with  3/10 pain or less.   Baseline 90* @ Eval; 08/11/16 = 125* painfree   Time 4   Period Weeks   Status Achieved     OT SHORT TERM GOAL #4   Title Pt will improve L grip strength by at least 5lbs to assist with opening containers/ADLs.   Baseline 15lbs at Eval; 08/11/16 R= 48# vs L = 39#   Time 4   Period Weeks   Status Achieved     OT SHORT TERM GOAL #5   Title Pt will verbalize understanding of memory/cognitive compensation strategies for ADLs prn.   Time 4   Period Weeks   Status Achieved     OT SHORT TERM GOAL #6   Title Pt will perform simple snack prep/home maintenance task mod I.   Time 4   Period Weeks   Status Achieved           OT Long Term Goals - 09/28/16 0825      OT LONG TERM GOAL #1   Title Pt will be independent with updated HEP.--check LTGs 09/11/16   Time 8   Period Weeks   Status  Achieved  09/08/16     OT LONG TERM GOAL #2   Title Pt will improve coordination for ADLs as shown by improving time on 9-hole peg test by 10sec with LUE.   Baseline 42.41sec   Time 8   Period Weeks   Status Achieved     OT LONG TERM GOAL #3   Title Pt will demo at least 110* L shoulder flex for functional reaching/ADLs with less than 2/10 pain.  Updated 08/12/16:  Pt will demo at least 135* L shoulder flex for functional reaching/ADLs with less than 2/10 pain.   Baseline 90*   Time 8   Period Weeks   Status On-going  08/12/16:  met initial goal and upgraded.   09/03/16:  120*;   09/28/16  130-135*     OT LONG TERM GOAL #4   Title Pt will improve L grip strength by at least 15lbs to assist with opening containers/ADLs.  Updated 08/12/16:  Pt will improve L grip strength to at least 42lbs to assist with opening containers/ADLs.   Baseline 15lbs   Time 8   Period Weeks   Status On-going  08/12/16  met initial goal and upgraded.  09/03/16  34lbs     OT LONG TERM GOAL #5   Title Pt will demo at least 100* L shoulder abduction for lateral reaching/ADLs with less than 3/10 pain.   Baseline 90*   Time 8   Period Weeks   Status Achieved  09/03/16:  115*     OT LONG TERM GOAL #6   Title Pt will perform simple cooking tasks/meal prep mod I.  08/12/16 updated:  Pt will perform mod complex cooking tasks/meal prep mod I.   Time 8   Period Weeks   Status Achieved  08/12/16  met initial goal and upgraded.   09/28/16 met per pt report     OT LONG TERM GOAL #7   Title Pt will use LUE as nondominant assist for ADLs/IADLs at least 85% of the time   Time 8   Period Weeks   Status Achieved  08/12/16 per pt report     OT LONG TERM GOAL #8   Title Pt will demo at least 120* L shoulder abduction for lateral reaching/ADLs with less than 3/10 pain.   Time 4   Period Weeks  Status New               Plan - 09/28/16 0623    Clinical Impression Statement Pt continues to progress towards  goals for UE strength and ROM.     Rehab Potential Good   OT Frequency 2x / week   OT Duration 4 weeks   OT Treatment/Interventions Self-care/ADL training;Cryotherapy;Parrafin;Therapeutic exercise;DME and/or AE instruction;Therapist, nutritional;Therapeutic activities;Patient/family education;Balance training;Cognitive remediation/compensation;Splinting;Manual Therapy;Neuromuscular education;Fluidtherapy;Ultrasound;Electrical Stimulation;Moist Heat;Energy conservation;Passive range of motion;Therapeutic exercises   Plan overhead reach, strengthening; review/?update band HEP   OT Home Exercise Plan Education provided:  Reviewed HEP for LUE ROM from hospital (pt to continue), coordination HEP upgraded putty to red; yellow theraband HEP (shoulder abduction, ext, flex, horizontal abduction); In shoulder flex, chest press, ER, horizontal abduction, ext with 1lb wt    Consulted and Agree with Plan of Care Patient;Family member/caregiver   Family Member Consulted dtr-in-law Jarrett Soho)      Patient will benefit from skilled therapeutic intervention in order to improve the following deficits and impairments:  Decreased coordination, Decreased knowledge of use of DME, Decreased strength, Impaired UE functional use, Decreased activity tolerance, Decreased range of motion, Pain, Decreased cognition, Decreased balance, Decreased mobility  Visit Diagnosis: Muscle weakness (generalized)  Hemiplegia and hemiparesis following cerebral infarction affecting right dominant side (HCC)  Stiffness of left shoulder, not elsewhere classified  Acute pain of left shoulder  Other lack of coordination    Problem List Patient Active Problem List   Diagnosis Date Noted  . 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 Gait disturbance, post-stroke 07/21/2016  . Ankle contracture, right 07/21/2016  . Post-operative pain   . Benign essential HTN   . Urinary retention   . Closed fracture of left proximal humerus   .  Left pontine cerebrovascular accident ([place]) 06/26/2016  . Stroke (cerebrum) ([place]) 06/23/2016  . Blood pressure elevated without history of HTN 06/23/2016  . Left pontine stroke ([place]) 06/23/2016  . Stroke-like symptoms   . Eye movement disorder   . Closed fracture of neck of humerus with routine healing     [facility] 09/28/2016, 8:43 AM  [facility] [address] [place], [place], [address] Phone: [phone]   Fax:  [phone]  Name: Melanie Hendricks MRN: [id number] Date of Birth: 01/06/1936   Vianne Bulls, OTR/L [facility] [address] [place], [place]  [address] [phone] phone [phone] 09/28/16 8:43 AM

## 2016-09-28 NOTE — Therapy (Signed)
[facility] [address] [place], [place], [address] Phone: [phone]   Fax:  [phone]  Physical Therapy Treatment  Patient Details  Name: Melanie Hendricks MRN: [id number] Date of Birth: 12/27/1935 Referring Provider: Ella Bodo  Encounter Date: 09/28/2016      PT End of Session - 09/28/16 1001    Visit Number 19   Number of Visits 24   Date for PT Re-Evaluation 10/11/16   Authorization Type Medicare, G-Code every 10th visit   PT Start Time 0853   PT Stop Time 0935   PT Time Calculation (min) 42 min   Equipment Utilized During Treatment Gait belt   Activity Tolerance Patient tolerated treatment well   Behavior During Therapy [facility] for tasks assessed/performed      Past Medical History:  Diagnosis Date  . Allergy   . Fx humeral neck   . Hepatitis   . Hyperlipidemia   . Hypertension   . Skin melanoma ([place])   . Stroke [facility])     Past Surgical History:  Procedure Laterality Date  . Melenoma removal    . TONSILLECTOMY      There were no vitals filed for this visit.      Subjective Assessment - 09/28/16 0854    Subjective No new complaints. No falls. Reports no longer using cane inside home; continues to use in community.     Patient is accompained by: --   Pertinent History Had Lt pontine CVA 06/23/16, completed inpatient rehab just prior to OPPT   Limitations Standing;Walking;Other (comment);House hold activities   Patient Stated Goals To return to usual self, driving, gardening, mowing yard, laundry, etc   Currently in Pain? No/denies   Pain Onset More than a month ago                         [facility] Adult PT Treatment/Exercise - 09/28/16 0952      Ambulation/Gait   Ambulation/Gait Assistance 5: Supervision   Ambulation/Gait Assistance Details supervision for safety only   Ambulation Distance (Feet) 950 Feet  paved outdoor with slopes @ curb cutouts; 50 grass   Assistive device  None   Gait Pattern Step-through pattern;Decreased stride length;Right flexed knee in stance;Decreased trunk rotation;Wide base of support   Ambulation Surface Outdoor;Paved;Grass   Door Management 6: Modified independent (Device/Increase time)  exterior doors   Gait Comments Patient very appreciative of outdoor walking without cane. Stated it felt great! She had been afraid to attempt on her own. Encouraged to continue with family members             Balance Exercises - 09/28/16 0946      Balance Exercises: Standing   Heel Raises Limitations Rt heel raises, bil UE support x 10 with heel clearing only ~1/2 inch by the 10th rep   Other Standing Exercises at counter, grapevine walking. Pt with difficulty alternating crossing forward and then backward, therefore repeated all forward cross-overs one way and then forward again opposite way. Then all crossing behind each direction. x 2 passes of each           PT Education - 09/28/16 0956    Education provided Yes   Education Details Patient brought entire HEP sheets into clinic (exercises from CIR stay and OPPT). Reviewed and eliminated bil heel raises (kept single limb heel raises). Educated on emphasis on hip abduction strengthening and added grapevine walking at counter. Patient reported little progress with SLS on  RLE and frustration. She has been doing at the counter and educated in performing in corner with chair in front of her to give her incr confidence (decr risk of fall in the corner).  Patient able to state signs/symptoms of CVA and need to call 911 for transport.    Person(s) Educated Patient   Methods Explanation;Demonstration;Handout   Comprehension Verbalized understanding;Returned demonstration          PT Short Term Goals - 09/28/16 1252      PT SHORT TERM GOAL #1   Title Patient will demonstrate decreased fall risk with Berg balance score 45/56 or greater. (all STGs due 08/10/2016)   Baseline 08/11/2016 Scored  46/56 today   Status Achieved     PT SHORT TERM GOAL #2   Title Patient to complete TUG and set goal as appropriate for fall risk reduction.   Baseline Tug performed today, completed in 17.49 sec. Was previously performed on 11/16 in 16.87 sec. No goal has been set   Status Deferred     PT SHORT TERM GOAL #3   Title Patient to verbalize CVA warning signs and risk factors with modifications for prevention.    Baseline Patient provided with stroke prevention handout today.   Status Achieved     PT SHORT TERM GOAL #4   Title Patient to ambulate indoor surfaces with cane and S over 300' no LOB.    Baseline 08/11/16 Met Patient ambulated 345 with S today   Status Achieved           PT Long Term Goals - 09/28/16 1004      PT LONG TERM GOAL #1   Title Patient to demonstrate decreased fall risk with Gait velocity >0.8 m/s (all LTGs due 09/04/16) = 2.62 ft/sec   New Target date 10-11-16   Baseline 09/14/16: 10/10.97 sec's= 0.91 m/s, 3.0 ft./sec   Status Achieved     PT LONG TERM GOAL #2   Title Patient to be independent with HEP for balance and strength and walking program.   Baseline met 09-03-16   Status Achieved     PT LONG TERM GOAL #3   Title Patient to ambulate indoors without device independent 300' no LOB   Baseline 09/14/16: met in clinic today   Status Achieved     PT LONG TERM GOAL #4   Title Patient to ambulate outdoors with cane and no LOB 500' paved and grassy surfaces.    Baseline met 08-27-16   Status Achieved     PT LONG TERM GOAL #5   Title Independent in updated HEP, progress from red to green theraband for hip strengthening exs. and balance exs as appropriate.  10-11-16   Time 4   Period Weeks   Status On-going     PT LONG TERM GOAL #6   Title Increase DGI by at least 4 points to increase safety with gait.  10-11-16   Baseline 09/14/16: baseline 17/24   Time 4   Period Weeks   Status On-going     PT LONG TERM GOAL #7   Title Amb. with supervision 200'  outside on paved surface without cane .  10-11-16 Achieved 09/28/16   Time 4   Period Weeks   Status Achieved               Plan - 09/28/16 1001    Clinical Impression Statement Patient continues to make good progress and is very dedicated to doing her HEP. Weather permitted walking outdoors without  cane and patient with no LOB (primarily on paved surface, however ~50 ft on the grass). HEP updated to incr difficulty and issued green theraband to incr resistance for HEP. Will reassess patient's progress toward LTG's next visit.    Rehab Potential Good   PT Frequency 2x / week   PT Duration 4 weeks   PT Treatment/Interventions DME Instruction;Gait training;Stair training;Functional mobility training;Therapeutic activities;Therapeutic exercise;Balance training;Neuromuscular re-education;Cognitive remediation;Patient/family education   PT Next Visit Plan reassess LTGs (DGI, gait velocity); begin    PT Home Exercise Plan added grapevine at counter   Consulted and Agree with Plan of Care Patient;Family member/caregiver   Family Member Consulted daughter in law - Jarrett Soho      Patient will benefit from skilled therapeutic intervention in order to improve the following deficits and impairments:  Abnormal gait, Decreased balance, Decreased cognition, Decreased mobility, Decreased strength, Decreased knowledge of use of DME  Visit Diagnosis: Hemiplegia and hemiparesis following cerebral infarction affecting right dominant side (HCC)  Abnormality of gait     Problem List Patient Active Problem List   Diagnosis Date Noted  . 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 Gait disturbance, post-stroke 07/21/2016  . Ankle contracture, right 07/21/2016  . Post-operative pain   . Benign essential HTN   . Urinary retention   . Closed fracture of left proximal humerus   . Left pontine cerebrovascular accident ([place]) 06/26/2016  . Stroke (cerebrum) ([place]) 06/23/2016  . Blood pressure elevated without  history of HTN 06/23/2016  . Left pontine stroke ([place]) 06/23/2016  . Stroke-like symptoms   . Eye movement disorder   . Closed fracture of neck of humerus with routine healing     Rexanne Mano, PT 09/28/2016, 1:07 PM  [facility] [address] [place], [place], [address] Phone: [phone]   Fax:  [phone]  Name: CHRISTOPHER HINK MRN: [id number] Date of Birth: 03/21/1936

## 2016-09-29 ENCOUNTER — Ambulatory Visit: Payer: Medicare Other | Admitting: Physical Medicine & Rehabilitation

## 2016-10-01 ENCOUNTER — Ambulatory Visit: Payer: Medicare Other | Admitting: Physical Therapy

## 2016-10-01 ENCOUNTER — Encounter: Payer: Medicare Other | Admitting: Occupational Therapy

## 2016-10-05 ENCOUNTER — Ambulatory Visit: Payer: Medicare Other | Admitting: Physical Therapy

## 2016-10-05 ENCOUNTER — Encounter: Payer: Medicare Other | Admitting: Occupational Therapy

## 2016-10-06 ENCOUNTER — Ambulatory Visit: Payer: Medicare Other | Admitting: Physical Medicine & Rehabilitation

## 2016-10-08 ENCOUNTER — Encounter: Payer: Medicare Other | Admitting: Occupational Therapy

## 2016-10-08 ENCOUNTER — Ambulatory Visit: Payer: Medicare Other | Admitting: Physical Therapy

## 2016-10-12 ENCOUNTER — Ambulatory Visit: Payer: Medicare Other | Admitting: Physical Medicine & Rehabilitation

## 2016-10-23 ENCOUNTER — Observation Stay (HOSPITAL_COMMUNITY)
Admission: EM | Admit: 2016-10-23 | Discharge: 2016-10-24 | Disposition: A | Discharge status: Home / Self Care | Payer: Medicare Other | Attending: Internal Medicine | Admitting: Internal Medicine

## 2016-10-23 ENCOUNTER — Emergency Department (HOSPITAL_COMMUNITY): Payer: Medicare Other

## 2016-10-23 ENCOUNTER — Encounter (HOSPITAL_COMMUNITY): Payer: Self-pay | Admitting: *Deleted

## 2016-10-23 ENCOUNTER — Telehealth: Payer: Self-pay | Admitting: Family

## 2016-10-23 DIAGNOSIS — E785 Hyperlipidemia, unspecified: Secondary | ICD-10-CM | POA: Insufficient documentation

## 2016-10-23 DIAGNOSIS — I10 Essential (primary) hypertension: Secondary | ICD-10-CM | POA: Insufficient documentation

## 2016-10-23 DIAGNOSIS — R471 Dysarthria and anarthria: Secondary | ICD-10-CM | POA: Diagnosis not present

## 2016-10-23 DIAGNOSIS — J189 Pneumonia, unspecified organism: Principal | ICD-10-CM | POA: Insufficient documentation

## 2016-10-23 DIAGNOSIS — Z85820 Personal history of malignant melanoma of skin: Secondary | ICD-10-CM | POA: Insufficient documentation

## 2016-10-23 DIAGNOSIS — J209 Acute bronchitis, unspecified: Secondary | ICD-10-CM | POA: Insufficient documentation

## 2016-10-23 DIAGNOSIS — R531 Weakness: Secondary | ICD-10-CM | POA: Diagnosis not present

## 2016-10-23 DIAGNOSIS — Z7902 Long term (current) use of antithrombotics/antiplatelets: Secondary | ICD-10-CM | POA: Diagnosis not present

## 2016-10-23 DIAGNOSIS — R519 Headache, unspecified: Secondary | ICD-10-CM | POA: Diagnosis present

## 2016-10-23 DIAGNOSIS — G44209 Tension-type headache, unspecified, not intractable: Secondary | ICD-10-CM | POA: Diagnosis not present

## 2016-10-23 DIAGNOSIS — K449 Diaphragmatic hernia without obstruction or gangrene: Secondary | ICD-10-CM | POA: Insufficient documentation

## 2016-10-23 DIAGNOSIS — I69351 Hemiplegia and hemiparesis following cerebral infarction affecting right dominant side: Secondary | ICD-10-CM | POA: Diagnosis not present

## 2016-10-23 DIAGNOSIS — I639 Cerebral infarction, unspecified: Secondary | ICD-10-CM | POA: Diagnosis present

## 2016-10-23 DIAGNOSIS — R2990 Unspecified symptoms and signs involving the nervous system: Secondary | ICD-10-CM | POA: Diagnosis present

## 2016-10-23 DIAGNOSIS — I6350 Cerebral infarction due to unspecified occlusion or stenosis of unspecified cerebral artery: Secondary | ICD-10-CM | POA: Diagnosis present

## 2016-10-23 DIAGNOSIS — I6782 Cerebral ischemia: Secondary | ICD-10-CM | POA: Diagnosis not present

## 2016-10-23 DIAGNOSIS — R51 Headache: Secondary | ICD-10-CM

## 2016-10-23 DIAGNOSIS — Z91040 Latex allergy status: Secondary | ICD-10-CM | POA: Insufficient documentation

## 2016-10-23 DIAGNOSIS — I700 Atherosclerosis of aorta: Secondary | ICD-10-CM | POA: Insufficient documentation

## 2016-10-23 DIAGNOSIS — Z7982 Long term (current) use of aspirin: Secondary | ICD-10-CM | POA: Insufficient documentation

## 2016-10-23 LAB — PROTIME-INR
INR: 1
Prothrombin Time: 13.2 seconds (ref 11.4–15.2)

## 2016-10-23 LAB — CBC
HEMATOCRIT: 41.6 % (ref 36.0–46.0)
HEMOGLOBIN: 13.9 g/dL (ref 12.0–15.0)
MCH: 30.3 pg (ref 26.0–34.0)
MCHC: 33.4 g/dL (ref 30.0–36.0)
MCV: 90.8 fL (ref 78.0–100.0)
Platelets: 290 10*3/uL (ref 150–400)
RBC: 4.58 MIL/uL (ref 3.87–5.11)
RDW: 12.7 % (ref 11.5–15.5)
WBC: 9.8 10*3/uL (ref 4.0–10.5)

## 2016-10-23 LAB — URINALYSIS, ROUTINE W REFLEX MICROSCOPIC
BILIRUBIN URINE: NEGATIVE
Glucose, UA: NEGATIVE mg/dL
Hgb urine dipstick: NEGATIVE
KETONES UR: NEGATIVE mg/dL
Leukocytes, UA: NEGATIVE
NITRITE: NEGATIVE
PH: 5 (ref 5.0–8.0)
PROTEIN: NEGATIVE mg/dL
Specific Gravity, Urine: 1.02 (ref 1.005–1.030)

## 2016-10-23 LAB — COMPREHENSIVE METABOLIC PANEL
ALK PHOS: 70 U/L (ref 38–126)
ALT: 20 U/L (ref 14–54)
AST: 23 U/L (ref 15–41)
Albumin: 3.9 g/dL (ref 3.5–5.0)
Anion gap: 10 (ref 5–15)
BILIRUBIN TOTAL: 0.9 mg/dL (ref 0.3–1.2)
BUN: 10 mg/dL (ref 6–20)
CO2: 20 mmol/L — ABNORMAL LOW (ref 22–32)
CREATININE: 0.75 mg/dL (ref 0.44–1.00)
Calcium: 9.4 mg/dL (ref 8.9–10.3)
Chloride: 109 mmol/L (ref 101–111)
GFR calc Af Amer: >60 mL/min (ref >60)
Glucose, Bld: 110 mg/dL — ABNORMAL HIGH (ref 65–99)
Potassium: 3.8 mmol/L (ref 3.5–5.1)
Sodium: 139 mmol/L (ref 135–145)
TOTAL PROTEIN: 7.1 g/dL (ref 6.5–8.1)

## 2016-10-23 LAB — DIFFERENTIAL
BASOS ABS: 0 10*3/uL (ref 0.0–0.1)
Basophils Relative: 0 %
Eosinophils Absolute: 0.1 10*3/uL (ref 0.0–0.7)
Eosinophils Relative: 1 %
LYMPHS ABS: 1.5 10*3/uL (ref 0.7–4.0)
Lymphocytes Relative: 15 %
MONOS PCT: 10 %
Monocytes Absolute: 0.9 10*3/uL (ref 0.1–1.0)
NEUTROS ABS: 7.2 10*3/uL (ref 1.7–7.7)
Neutrophils Relative %: 74 %

## 2016-10-23 LAB — I-STAT CHEM 8, ED
BUN: 11 mg/dL (ref 6–20)
CREATININE: 0.7 mg/dL (ref 0.44–1.00)
Calcium, Ion: 1.09 mmol/L — ABNORMAL LOW (ref 1.15–1.40)
Chloride: 110 mmol/L (ref 101–111)
Glucose, Bld: 106 mg/dL — ABNORMAL HIGH (ref 65–99)
HEMATOCRIT: 42 % (ref 36.0–46.0)
Hemoglobin: 14.3 g/dL (ref 12.0–15.0)
Potassium: 3.7 mmol/L (ref 3.5–5.1)
Sodium: 141 mmol/L (ref 135–145)
TCO2: 24 mmol/L (ref 0–100)

## 2016-10-23 LAB — I-STAT TROPONIN, ED: TROPONIN I, POC: 0.01 ng/mL (ref 0.00–0.08)

## 2016-10-23 LAB — CBG MONITORING, ED: Glucose-Capillary: 115 mg/dL — ABNORMAL HIGH (ref 65–99)

## 2016-10-23 MED ORDER — SODIUM CHLORIDE 0.9 % IV BOLUS (SEPSIS)
1000.0000 mL | Freq: Once | INTRAVENOUS | Status: AC
  Administered 2016-10-24: 1000 mL via INTRAVENOUS

## 2016-10-23 MED ORDER — IOPAMIDOL (ISOVUE-370) INJECTION 76%
INTRAVENOUS | Status: AC
  Administered 2016-10-23: 80 mL
  Filled 2016-10-23: qty 100

## 2016-10-23 MED ORDER — SODIUM CHLORIDE 0.9 % IV BOLUS (SEPSIS)
1000.0000 mL | Freq: Once | INTRAVENOUS | Status: AC
  Administered 2016-10-23: 1000 mL via INTRAVENOUS

## 2016-10-23 MED ORDER — ALBUTEROL SULFATE (2.5 MG/3ML) 0.083% IN NEBU
2.5000 mg | INHALATION_SOLUTION | Freq: Once | RESPIRATORY_TRACT | Status: AC
  Administered 2016-10-23: 2.5 mg via RESPIRATORY_TRACT
  Filled 2016-10-23: qty 3

## 2016-10-23 MED ORDER — DEXTROSE 5 % IV SOLN
1.0000 g | Freq: Once | INTRAVENOUS | Status: AC
  Administered 2016-10-24: 1 g via INTRAVENOUS
  Filled 2016-10-23: qty 10

## 2016-10-23 MED ORDER — MAGNESIUM SULFATE 2 GM/50ML IV SOLN
2.0000 g | Freq: Once | INTRAVENOUS | Status: AC
  Administered 2016-10-23: 2 g via INTRAVENOUS
  Filled 2016-10-23: qty 50

## 2016-10-23 MED ORDER — METOCLOPRAMIDE HCL 5 MG/ML IJ SOLN
10.0000 mg | Freq: Once | INTRAMUSCULAR | Status: AC
  Administered 2016-10-23: 10 mg via INTRAVENOUS
  Filled 2016-10-23: qty 2

## 2016-10-23 MED ORDER — KETOROLAC TROMETHAMINE 15 MG/ML IJ SOLN
15.0000 mg | Freq: Once | INTRAMUSCULAR | Status: AC
  Administered 2016-10-23: 15 mg via INTRAVENOUS
  Filled 2016-10-23: qty 1

## 2016-10-23 MED ORDER — DIPHENHYDRAMINE HCL 50 MG/ML IJ SOLN
25.0000 mg | Freq: Once | INTRAMUSCULAR | Status: AC
  Administered 2016-10-23: 25 mg via INTRAVENOUS
  Filled 2016-10-23: qty 1

## 2016-10-23 NOTE — ED Triage Notes (Signed)
Per EMS, pt c/o HA onset today, pt hx of CVA, pt reports stop taking Plavix Jan 2018, no facial droop or slurred speech reported, per EMS pt states, "It feels like it did when I had my stroke." pt A&O x4

## 2016-10-23 NOTE — ED Notes (Signed)
Patient brought to room after CT.

## 2016-10-23 NOTE — ED Notes (Signed)
MD at bedside. 

## 2016-10-23 NOTE — ED Notes (Signed)
Patient transported to MRI 

## 2016-10-23 NOTE — Consult Note (Signed)
Neurology Consultation Reason for Consult: Worsening stroke symptoms Referring Physician: Oleta Mouse, D  CC: Worsening stroke symptoms  History is obtained from: Patient, family  HPI: Melanie Hendricks is a 81 y.o. female with a history of recent pontine stroke who at that time had right-sided weakness and dysarthria. She has been recently struggling, recovering from the flu and coughing and complained of headache.  In this setting, she has noticed that her right-sided weakness is worse than it had been, as well as dysarthria. All of these things are symptoms of her previous stroke, but had recovered.   LKW: Unclear tpa given?: no, out of window  ROS: A 14 point ROS was performed and is negative except as noted in the HPI.   Past Medical History:  Diagnosis Date  . Allergy   . Fx humeral neck   . Hepatitis   . Hyperlipidemia   . Hypertension   . Skin melanoma ([place])   . Stroke [facility])      Family History  Problem Relation Age of Onset  . Stomach cancer Mother   . Thyroid cancer Father   . Heart attack Father   . Seizures Brother   . Heart failure Brother   . Hypertension Maternal Grandmother      Social History:  reports that she has never smoked. She has never used smokeless tobacco. She reports that she does not drink alcohol or use drugs.   Exam: Current vital signs: BP (!) 159/53   Pulse 111   Temp 97.7 F (36.5 C)   Resp 23   SpO2 95%  Vital signs in last 24 hours: Temp:  [97.6 F (36.4 C)-97.7 F (36.5 C)] 97.7 F (36.5 C) (02/16 1825) Pulse Rate:  [107-119] 111 (02/16 2100) Resp:  [18-39] 23 (02/16 2100) BP: (142-176)/(45-70) 159/53 (02/16 2100) SpO2:  [95 %-100 %] 95 % (02/16 2100)   Physical Exam  Constitutional: Appears well-developed and well-nourished.  Psych: Affect appropriate to situation Eyes: No scleral injection HENT: No OP obstrucion Head: Normocephalic.  Cardiovascular: Normal rate and regular rhythm.  Respiratory: Effort normal and  breath sounds normal to anterior ascultation GI: Soft.  No distension. There is no tenderness.  Skin: WDI  Neuro: Mental Status: Patient is awake, alert, oriented to person, place, month, year, and situation. Patient is able to give a clear and coherent history. No signs of aphasia or neglect Cranial Nerves: II: Visual Fields are full. Pupils are equal, round, and reactive to light.   III,IV, VI: EOMI without ptosis or diploplia.  V: Facial sensation is symmetric to temperature VII: Facial movement is notable for right facial weakness VIII: hearing is intact to voice X: Uvula elevates symmetrically XI: Shoulder shrug is symmetric. XII: tongue is midline without atrophy or fasciculations.  Motor: Tone is normal. Bulk is normal. 5/5 strength was present on the left, she has 5 minus/5 strength in the right arm, 4 minus/5 strength in the right leg Sensory: Sensation is symmetric to light touch and temperature in the arms and legs. Cerebellar: Mild tremor bilaterally, but no clear passpointing     I have reviewed labs in epic and the results pertinent to this consultation are: CMP-unremarkable  I have reviewed the images obtained: CT head-no acute findings  Impression: 81 year old female with worsening of underlying symptoms. Possibilities include extension of previous infarcts versus recrudescence of symptoms in the setting of her URI.  Recommendations: 1) MRI brain 2) No further recommendations if MRI si negative.    Roland Rack,  MD [facility] [phone]  If 7pm- 7am, please page neurology on call as listed in [place].

## 2016-10-23 NOTE — ED Provider Notes (Signed)
I saw and evaluated the patient, reviewed the resident's note and I agree with the findings and plan.   EKG Interpretation  Date/Time:  Friday October 23 2016 15:09:35 EST Ventricular Rate:  130 PR Interval:  164 QRS Duration: 62 QT Interval:  276 QTC Calculation: 406 R Axis:   67 Text Interpretation:  Sinus tachycardia with Fusion complexes Low voltage QRS Borderline ECG no other changes  Confirmed by Brendy Ficek MD, Hinton Dyer [id number]) on 10/23/2016 4:32:25 PM       I have independently reviewed the following tracings and/or images and used them in my medical decision making: CXR, CTA chest, MRI brain, CT head  81 year old female presenting with headache. History of CVA with residual left leg weakness and minimal dysarthria, hypertension, hyperlipidemia. Plavix was discontinued in January 2018 per patient. She has been no usual state of health but woke up this morning with headache at around 10 AM. An 8 out of 10 in severity associated with nausea and dry heaving. Family noticed that his speech has been more slurred although she does not feel this way. No new numbness or weakness, vision changes, fevers or chills. Does feel slightly short of breath recently. No chest pain.  Afebrile, hemodynamically stable but tachycardic here in the ED and tachypnea. No respiratory distress otherwise. Slight dysarthria noted as well as baseline weakness in the right lower extremity. No other neuro deficits noted. CT head obtained, visualized and shows no acute intracranial processes. Given questionable worsening dysarthria, MRI performed per neurology recommendations. This does not show stroke.   Patient with persistent tachycardia and tachypnea. Chest x-ray not showing any acute cardiopulmonary processes. Subsequently perform CTA chest to rule out PE. No evidence of PE but questionable pneumonia. Given her tachypnea and tachycardia has been persistent despite IV fluids will plan to admit with antibiotics for  observation.      Forde Dandy, MD 10/23/16 2352

## 2016-10-23 NOTE — ED Notes (Signed)
Dr. Kirkpatrick at bedside 

## 2016-10-23 NOTE — ED Notes (Signed)
Pt taken to xray at this time.

## 2016-10-23 NOTE — ED Provider Notes (Signed)
[facility] DEPT Provider Note   CSN: [id number] Arrival date & time: 10/23/16  1442     History   Chief Complaint Chief Complaint  Patient presents with  . Headache    HPI Melanie Hendricks is a 81 y.o. female.  The history is provided by the patient.  Headache   This is a new problem. The current episode started 6 to 12 hours ago. The problem occurs constantly. The problem has not changed since onset.The headache is associated with nothing. Pain location: generalized. The quality of the pain is described as dull. The pain is moderate. The pain does not radiate. Pertinent negatives include no fever, no palpitations, no shortness of breath, no nausea and no vomiting. She has tried aspirin for the symptoms. The treatment provided no relief.    Past Medical History:  Diagnosis Date  . Allergy   . Fx humeral neck   . Hepatitis   . Hyperlipidemia   . Hypertension   . Skin melanoma ([place])   . Stroke [facility])     Patient Active Problem List   Diagnosis Date Noted  . 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 Gait disturbance, post-stroke 07/21/2016  . Ankle contracture, right 07/21/2016  . Post-operative pain   . Benign essential HTN   . Urinary retention   . Closed fracture of left proximal humerus   . Left pontine cerebrovascular accident ([place]) 06/26/2016  . Stroke (cerebrum) ([place]) 06/23/2016  . Blood pressure elevated without history of HTN 06/23/2016  . Left pontine stroke ([place]) 06/23/2016  . Stroke-like symptoms   . Eye movement disorder   . Closed fracture of neck of humerus with routine healing     Past Surgical History:  Procedure Laterality Date  . Melenoma removal    . TONSILLECTOMY      OB History    No data available       Home Medications    Prior to Admission medications   Medication Sig Start Date End Date Taking? Authorizing Provider  aspirin EC 325 MG tablet Take 650-975 mg by mouth daily as needed (headache).   Yes Historical Provider, MD    aspirin EC 81 MG EC tablet Take 1 tablet (81 mg total) by mouth daily. 07/10/16  Yes Daniel J Angiulli, PA-C  atorvastatin (LIPITOR) 40 MG tablet Take 1 tablet (40 mg total) by mouth daily at 6 PM. 09/04/16  Yes Golden Circle, FNP  lisinopril (PRINIVIL,ZESTRIL) 10 MG tablet Take 1 tablet (10 mg total) by mouth daily. 09/04/16  Yes Golden Circle, FNP  clopidogrel (PLAVIX) 75 MG tablet Take 1 tablet (75 mg total) by mouth daily. Patient not taking: Reported on 09/28/2016 09/04/16   Golden Circle, FNP  docusate sodium (COLACE) 100 MG capsule Take 1 capsule (100 mg total) by mouth 2 (two) times daily. Patient not taking: Reported on 10/23/2016 09/04/16   Golden Circle, FNP  traMADol (ULTRAM) 50 MG tablet Take 1 tablet (50 mg total) by mouth every 6 (six) hours as needed for moderate pain. Patient not taking: Reported on 10/23/2016 07/10/16   Lavon Paganini Angiulli, PA-C    Family History Family History  Problem Relation Age of Onset  . Stomach cancer Mother   . Thyroid cancer Father   . Heart attack Father   . Seizures Brother   . Heart failure Brother   . Hypertension Maternal Grandmother     Social History Social History  Substance Use Topics  . Smoking status: Never Smoker  .  Smokeless tobacco: Never Used  . Alcohol use No     Allergies   Other   Review of Systems Review of Systems  Constitutional: Negative for chills and fever.  HENT: Negative for ear pain and sore throat.   Eyes: Negative for pain and visual disturbance.  Respiratory: Positive for cough. Negative for shortness of breath.   Cardiovascular: Negative for chest pain and palpitations.  Gastrointestinal: Negative for abdominal pain, nausea and vomiting.  Genitourinary: Negative for dysuria and hematuria.  Musculoskeletal: Negative for arthralgias and back pain.  Skin: Negative for color change and rash.  Neurological: Positive for speech difficulty and headaches. Negative for seizures and syncope.  All  other systems reviewed and are negative.    Physical Exam Updated Vital Signs BP (!) 136/48   Pulse 96   Temp 97.7 F (36.5 C)   Resp (!) 36   SpO2 97%   Physical Exam  Constitutional: She is oriented to person, place, and time. She appears well-developed and well-nourished. No distress.  HENT:  Head: Normocephalic and atraumatic.  Eyes: Conjunctivae are normal.  Neck: Neck supple.  Cardiovascular: Normal rate and regular rhythm.  Exam reveals no friction rub.   No murmur heard. Pulmonary/Chest: Effort normal. No respiratory distress. She has wheezes (occasional upper lung wheezing bilaterally).  Abdominal: Soft. There is no tenderness.  Musculoskeletal: She exhibits no edema, tenderness or deformity.  Neurological: She is alert and oriented to person, place, and time. A cranial nerve deficit is present. No sensory deficit. She exhibits normal muscle tone. Coordination normal.  Strength 5/5 throughout all extremities. Sensation to light touch intact throughout. Normal finger to nose and heel to shin testing. Mild speech slurring evident during exam. Asymmetric palatal elevation on right.  Skin: Skin is warm and dry.  Psychiatric: She has a normal mood and affect.  Nursing note and vitals reviewed.    ED Treatments / Results  Labs (all labs ordered are listed, but only abnormal results are displayed) Labs Reviewed  COMPREHENSIVE METABOLIC PANEL - Abnormal; Notable for the following:       Result Value   CO2 20 (*)    Glucose, Bld 110 (*)    All other components within normal limits  URINALYSIS, ROUTINE W REFLEX MICROSCOPIC - Abnormal; Notable for the following:    APPearance HAZY (*)    All other components within normal limits  CBG MONITORING, ED - Abnormal; Notable for the following:    Glucose-Capillary 115 (*)    All other components within normal limits  I-STAT CHEM 8, ED - Abnormal; Notable for the following:    Glucose, Bld 106 (*)    Calcium, Ion 1.09 (*)     All other components within normal limits  CBC  DIFFERENTIAL  PROTIME-INR  I-STAT TROPOININ, ED  I-STAT CG4 LACTIC ACID, ED    EKG  EKG Interpretation  Date/Time:  Friday October 23 2016 15:09:35 EST Ventricular Rate:  130 PR Interval:  164 QRS Duration: 62 QT Interval:  276 QTC Calculation: 406 R Axis:   67 Text Interpretation:  Sinus tachycardia with Fusion complexes Low voltage QRS Borderline ECG no other changes  Confirmed by LIU MD, DANA [phone]) on 10/23/2016 4:32:25 PM       Radiology Dg Chest 2 View  Result Date: 10/23/2016 CLINICAL DATA:  Cough EXAM: CHEST  2 VIEW COMPARISON:  Jan 08, 2011 FINDINGS: There is no edema or consolidation. The heart size and pulmonary vascularity are normal. No adenopathy. There is atherosclerotic  calcification in the aorta. There is remodeling and proximal left humerus from old trauma. IMPRESSION: No edema or consolidation.  Aortic atherosclerosis. Electronically Signed   By: Lowella Grip III M.D.   On: 10/23/2016 17:08   Ct Head Wo Contrast  Result Date: 10/23/2016 CLINICAL DATA:  81 year old female with a history of headache EXAM: CT HEAD WITHOUT CONTRAST TECHNIQUE: Contiguous axial images were obtained from the base of the skull through the vertex without intravenous contrast. COMPARISON:  None. FINDINGS: Brain: No acute intracranial hemorrhage. No midline shift. No mass effect. Unremarkable appearance of the ventricular system. Vascular: Calcifications of the intracranial vasculature. Skull: No displaced fracture. No aggressive bony lesion. Mastoid air cells clear. Sinuses/Orbits: Bilateral optic drusen.  No paranasal sinus disease. Other: None. IMPRESSION: No CT evidence of acute intracranial abnormality Signed, Dulcy Fanny. Earleen Newport, DO [facility] Radiology Electronically Signed   By: Corrie Mckusick D.O.   On: 10/23/2016 16:25   Ct Angio Chest Pe W And/or Wo Contrast  Result Date:  10/23/2016 CLINICAL DATA:  Headaches and dysarthria. History of stroke in the past. Recent cough and flu. EXAM: CT ANGIOGRAPHY CHEST WITH CONTRAST TECHNIQUE: Multidetector CT imaging of the chest was performed using the standard protocol during bolus administration of intravenous contrast. Multiplanar CT image reconstructions and MIPs were obtained to evaluate the vascular anatomy. CONTRAST:  80 mL Isovue 370 COMPARISON:  None. FINDINGS: Cardiovascular: Examination is technically limited due to motion artifact. Central pulmonary arteries are well opacified without evidence of any filling defect. Nothing to suggest pulmonary embolus. Normal heart size. Calcification in the mitral valve annulus. No pericardial effusion. Normal caliber thoracic aorta. No dissection. Calcific and noncalcific atherosclerotic changes are demonstrated in the aorta and great vessel origins. Great vessels are patent. Coronary artery calcifications. Mediastinum/Nodes: No significant lymphadenopathy in the chest. Small esophageal hiatal hernia. Esophagus is not significantly dilated but there is residual material in the esophagus. This likely represents reflux or dysmotility. Lungs/Pleura: Evaluation of lungs is severely limited due to motion artifact. There is evidence of emphysema. Scarring in the lung apices and lung bases. Patchy mosaic pattern to the lungs could be due to motion artifact, air trapping, pneumonia, or edema. No pleural effusions. No pneumothorax. Airways are patent. Upper Abdomen: No acute process identified. Musculoskeletal: Degenerative changes in the spine. No destructive bone lesions. Review of the MIP images confirms the above findings. IMPRESSION: No evidence of significant pulmonary embolus. Aortic atherosclerosis. Small esophageal hiatal hernia with residual material in the esophagus suggesting reflux or dysmotility. Emphysematous changes and fibrosis in the lungs. Patchy mosaic pattern could be due to air trapping,  motion artifact, edema, or pneumonia. Electronically Signed   By: Lucienne Capers M.D.   On: 10/23/2016 22:33   Mr Brain Wo Contrast  Result Date: 10/23/2016 CLINICAL DATA:  Initial evaluation for acute dysarthria. EXAM: MRI HEAD WITHOUT CONTRAST TECHNIQUE: Multiplanar, multiecho pulse sequences of the brain and surrounding structures were obtained without intravenous contrast. COMPARISON:  Prior CT from earlier the same day. FINDINGS: Brain: Age appropriate cerebral volume loss present. Patchy T2/FLAIR hyperintensity within the periventricular and deep white matter both cerebral hemispheres most consistent with chronic small vessel ischemic disease, mild in nature. Small focus of serpiginous encephalomalacia within the left inferior pons likely reflective of a remote infarct. No abnormal foci of restricted diffusion to suggest acute or subacute ischemia. Gray-white matter differentiation maintained. No evidence for acute or chronic intracranial hemorrhage. No other areas of chronic infarction. No mass lesion,  midline shift or mass effect. Ventricles normal size without evidence for hydrocephalus. No extra-axial fluid collection. Major dural sinuses are grossly patent. Pituitary gland and suprasellar region within normal limits. Vascular: Major intracranial vascular flow voids are largely maintained. Vertebrobasilar system is diminutive. Skull and upper cervical spine: Craniocervical junction within normal limits. Visualized upper cervical spine unremarkable. Bone marrow signal intensity within normal limits. No scalp soft tissue abnormality. Sinuses/Orbits: Globes and orbital soft tissues within normal limits. Scattered mucosal thickening within the ethmoidal air cells. Paranasal sinuses are otherwise clear. Trace bilateral mastoid effusions, of doubtful significance. Inner ear structures grossly normal. IMPRESSION: 1. No acute intracranial process identified. 2. Small remote left pontine infarct. 3. Mild  chronic microvascular ischemic disease. Electronically Signed   By: Jeannine Boga M.D.   On: 10/23/2016 23:39    Procedures Procedures (including critical care time)  Medications Ordered in ED Medications  sodium chloride 0.9 % bolus 1,000 mL (not administered)  cefTRIAXone (ROCEPHIN) 1 g in dextrose 5 % 50 mL IVPB (not administered)  methylPREDNISolone sodium succinate (SOLU-MEDROL) 125 mg/2 mL injection 125 mg (not administered)  metoCLOPramide (REGLAN) injection 10 mg (10 mg Intravenous Given 10/23/16 1714)  diphenhydrAMINE (BENADRYL) injection 25 mg (25 mg Intravenous Given 10/23/16 1714)  sodium chloride 0.9 % bolus 1,000 mL (1,000 mLs Intravenous New Bag/Given 10/23/16 1714)  albuterol (PROVENTIL) (2.5 MG/3ML) 0.083% nebulizer solution 2.5 mg (2.5 mg Nebulization Given 10/23/16 1809)  ketorolac (TORADOL) 15 MG/ML injection 15 mg (15 mg Intravenous Given 10/23/16 2127)  magnesium sulfate IVPB 2 g 50 mL (2 g Intravenous New Bag/Given 10/23/16 2127)  iopamidol (ISOVUE-370) 76 % injection (80 mLs  Contrast Given 10/23/16 2147)     Initial Impression / Assessment and Plan / ED Course  I have reviewed the triage vital signs and the nursing notes.  Pertinent labs & imaging results that were available during my care of the patient were reviewed by me and considered in my medical decision making (see chart for details).    Pt is a 81 yo female with hx as above who p/w headache and dysarthria. She had a recent pontine stroke in 07/2016. She has a residual mild RLE weakness from that infarct. Pt treated for flu about 3 weeks ago, completed tamiflu. She has had ongoing cough that has become productive over the last couple of days. Denies any fever or chills. Her headache started this morning at 0900 when she awoke. It has been constant and is diffuse. She does have dysarthria and asymmetric palatal elevation on exam. CT head with no acute findings. Pt's family reports they spoke to her last  night and her speech was normal. She has had intermittent slurred speech since her stroke, but this seemed worse to family. Neurology consulted who recommended MRI. Headache treated with reglan and benadryl and improved moderately. Toradol and Mg given with resolution of headache.  Regarding her respiratory symptoms, pt has normal SaO2 on room air, although she is mildly tachypnic and tachycardic. No hx of DVT/PE. CTA performed which showed no evidence of PE but concerning for possible pneumonia. Although WBC is normal, given her tachycardia and tachypnea, will treat for CAP. MRI showed no acute findings. Pt HR has improved with fluids. Lung exam still with mild upper airway wheezing. Will give steroids. Pt admitted to hospitalist for observation.  Final Clinical Impressions(s) / ED Diagnoses   Final diagnoses:  Community acquired pneumonia, unspecified laterality  Acute non intractable tension-type headache    New Prescriptions New Prescriptions  No medications on file     Clifton James, MD 10/24/16 [id number]    Forde Dandy, MD 10/24/16 1155

## 2016-10-23 NOTE — ED Notes (Signed)
Patient transported to CT 

## 2016-10-23 NOTE — Telephone Encounter (Signed)
Patient Name: Melanie Hendricks  DOB: 1936-09-02    Initial Comment Caller has stroke in Oct, has flu, now has really bad headache.    Nurse Assessment      Guidelines    Guideline Title Affirmed Question Affirmed Notes       Final Disposition User   FINAL ATTEMPT MADE - message left Raphael Gibney, Therapist, sports, Vanita Ingles

## 2016-10-24 DIAGNOSIS — I6350 Cerebral infarction due to unspecified occlusion or stenosis of unspecified cerebral artery: Secondary | ICD-10-CM | POA: Diagnosis not present

## 2016-10-24 DIAGNOSIS — J209 Acute bronchitis, unspecified: Secondary | ICD-10-CM

## 2016-10-24 DIAGNOSIS — R519 Headache, unspecified: Secondary | ICD-10-CM | POA: Diagnosis present

## 2016-10-24 DIAGNOSIS — J189 Pneumonia, unspecified organism: Secondary | ICD-10-CM | POA: Diagnosis not present

## 2016-10-24 DIAGNOSIS — R51 Headache: Secondary | ICD-10-CM

## 2016-10-24 DIAGNOSIS — R2990 Unspecified symptoms and signs involving the nervous system: Secondary | ICD-10-CM

## 2016-10-24 DIAGNOSIS — I10 Essential (primary) hypertension: Secondary | ICD-10-CM | POA: Diagnosis not present

## 2016-10-24 LAB — CBC
HEMATOCRIT: 34.7 % — AB (ref 36.0–46.0)
Hemoglobin: 11.3 g/dL — ABNORMAL LOW (ref 12.0–15.0)
MCH: 29.6 pg (ref 26.0–34.0)
MCHC: 32.6 g/dL (ref 30.0–36.0)
MCV: 90.8 fL (ref 78.0–100.0)
PLATELETS: 250 10*3/uL (ref 150–400)
RBC: 3.82 MIL/uL — AB (ref 3.87–5.11)
RDW: 12.7 % (ref 11.5–15.5)
WBC: 7.7 10*3/uL (ref 4.0–10.5)

## 2016-10-24 LAB — RESPIRATORY PANEL BY PCR
ADENOVIRUS-RVPPCR: NOT DETECTED
Bordetella pertussis: NOT DETECTED
CORONAVIRUS 229E-RVPPCR: NOT DETECTED
CORONAVIRUS HKU1-RVPPCR: NOT DETECTED
CORONAVIRUS NL63-RVPPCR: NOT DETECTED
CORONAVIRUS OC43-RVPPCR: NOT DETECTED
Chlamydophila pneumoniae: NOT DETECTED
Influenza A: NOT DETECTED
Influenza B: NOT DETECTED
METAPNEUMOVIRUS-RVPPCR: NOT DETECTED
Mycoplasma pneumoniae: NOT DETECTED
PARAINFLUENZA VIRUS 1-RVPPCR: NOT DETECTED
PARAINFLUENZA VIRUS 2-RVPPCR: NOT DETECTED
PARAINFLUENZA VIRUS 3-RVPPCR: NOT DETECTED
Parainfluenza Virus 4: NOT DETECTED
RHINOVIRUS / ENTEROVIRUS - RVPPCR: NOT DETECTED
Respiratory Syncytial Virus: NOT DETECTED

## 2016-10-24 LAB — BASIC METABOLIC PANEL
ANION GAP: 9 (ref 5–15)
BUN: 10 mg/dL (ref 6–20)
CALCIUM: 8.8 mg/dL — AB (ref 8.9–10.3)
CO2: 22 mmol/L (ref 22–32)
Chloride: 109 mmol/L (ref 101–111)
Creatinine, Ser: 0.83 mg/dL (ref 0.44–1.00)
GLUCOSE: 147 mg/dL — AB (ref 65–99)
POTASSIUM: 4 mmol/L (ref 3.5–5.1)
Sodium: 140 mmol/L (ref 135–145)

## 2016-10-24 LAB — BRAIN NATRIURETIC PEPTIDE: B NATRIURETIC PEPTIDE 5: 85 pg/mL (ref 0.0–100.0)

## 2016-10-24 LAB — I-STAT CG4 LACTIC ACID, ED: Lactic Acid, Venous: 1.5 mmol/L (ref 0.5–1.9)

## 2016-10-24 LAB — HIV ANTIBODY (ROUTINE TESTING W REFLEX): HIV Screen 4th Generation wRfx: NONREACTIVE

## 2016-10-24 MED ORDER — ATORVASTATIN CALCIUM 40 MG PO TABS: 40.0000 mg | ORAL_TABLET | Freq: Every day | ORAL | Status: DC

## 2016-10-24 MED ORDER — ACETAMINOPHEN 650 MG RE SUPP: 650.0000 mg | Freq: Four times a day (QID) | RECTAL | Status: DC | PRN

## 2016-10-24 MED ORDER — ONDANSETRON HCL 4 MG PO TABS: 4.0000 mg | ORAL_TABLET | Freq: Four times a day (QID) | ORAL | Status: DC | PRN

## 2016-10-24 MED ORDER — ENOXAPARIN SODIUM 40 MG/0.4ML ~~LOC~~ SOLN
40.0000 mg | Freq: Every day | SUBCUTANEOUS | Status: DC
  Administered 2016-10-24: 40 mg via SUBCUTANEOUS
  Filled 2016-10-24: qty 0.4

## 2016-10-24 MED ORDER — LISINOPRIL 10 MG PO TABS
10.0000 mg | ORAL_TABLET | Freq: Every day | ORAL | Status: DC
  Administered 2016-10-24: 10 mg via ORAL
  Filled 2016-10-24: qty 1

## 2016-10-24 MED ORDER — ONDANSETRON HCL 4 MG/2ML IJ SOLN: 4.0000 mg | Freq: Four times a day (QID) | INTRAMUSCULAR | Status: DC | PRN

## 2016-10-24 MED ORDER — GUAIFENESIN ER 600 MG PO TB12: 1200.0000 mg | ORAL_TABLET | Freq: Two times a day (BID) | ORAL | 0 refills | Status: AC

## 2016-10-24 MED ORDER — CEFTRIAXONE SODIUM 1 G IJ SOLR: 1.0000 g | INTRAMUSCULAR | Status: DC

## 2016-10-24 MED ORDER — LEVOFLOXACIN 500 MG PO TABS: 500.0000 mg | ORAL_TABLET | Freq: Every day | ORAL | 0 refills | Status: AC

## 2016-10-24 MED ORDER — ASPIRIN EC 81 MG PO TBEC
81.0000 mg | DELAYED_RELEASE_TABLET | Freq: Every day | ORAL | Status: DC
  Administered 2016-10-24: 81 mg via ORAL
  Filled 2016-10-24: qty 1

## 2016-10-24 MED ORDER — ACETAMINOPHEN 325 MG PO TABS
650.0000 mg | ORAL_TABLET | Freq: Four times a day (QID) | ORAL | Status: DC | PRN
Start: 2016-10-24 — End: 2016-10-24
  Administered 2016-10-24: 650 mg via ORAL
  Filled 2016-10-24: qty 2

## 2016-10-24 MED ORDER — IPRATROPIUM-ALBUTEROL 0.5-2.5 (3) MG/3ML IN SOLN: 3.0000 mL | RESPIRATORY_TRACT | Status: DC | PRN

## 2016-10-24 MED ORDER — SODIUM CHLORIDE 0.9 % IV SOLN: INTRAVENOUS | Status: DC

## 2016-10-24 MED ORDER — DEXTROSE 5 % IV SOLN
500.0000 mg | Freq: Every day | INTRAVENOUS | Status: DC
  Administered 2016-10-24: 500 mg via INTRAVENOUS
  Filled 2016-10-24: qty 500

## 2016-10-24 MED ORDER — AZITHROMYCIN 500 MG PO TABS: 500.0000 mg | ORAL_TABLET | Freq: Every day | ORAL | 0 refills | Status: DC

## 2016-10-24 MED ORDER — METHYLPREDNISOLONE SODIUM SUCC 125 MG IJ SOLR
125.0000 mg | Freq: Once | INTRAMUSCULAR | Status: AC
  Administered 2016-10-24: 125 mg via INTRAVENOUS
  Filled 2016-10-24: qty 2

## 2016-10-24 NOTE — Progress Notes (Signed)
Pt d/c to home by car with family. Assessment stable. Prescriptions given. All questions answered. 

## 2016-10-24 NOTE — H&P (Addendum)
History and Physical    Melanie Hendricks [id number] DOB: [birth year] DOA: 10/23/2016  Referring MD/NP/PA: Cyndi Lennert PCP: Mauricio Po, FNP  Patient coming from: Home via EMS  Chief Complaint: Headache  HPI: Melanie Hendricks is a 81 y.o. female with medical history significant of HTN, HLD, and recent pontine CVA with residual right-sided weakness in 06/2016; who presents with complaints of headache and slurred speech. Patient states that she woke up this morning with what she describes as  a bandlike headache. She tried taking 3 aspirin without relief of symptoms. Thereafter patient noted slurred speech and eye strain similar to symptoms she had with her previous stroke. Reports recently stopped Plavix in 09/2016. Associated symptoms include a persistent cough and generalized malaise. 3 weeks ago patient was diagnosed with the flu which she completed a course of Tamiflu. Symptoms initially got better, but again developed a productive cough this week. Denies having any recurrent fevers, hemoptysis, dysuria, or significant shortness of breath.  ED Course: In the emergency department was noted to be respirations in the 30's and heart rates into the 130s. CT angiogram revealed no signs of a pulmonary embolus, but was positive for signs of pneumonia.  Patient had been given magnesium sulfate and Toradol to help relieve headache symptoms.She received azithromycin and ceftriaxone for suspected community-acquired pneumonia.  Neurology was consulted to evaluate the patient and recommended MRI. If negative no further workup was needed. Lab work was unremarkable.   Review of Systems: As per HPI otherwise 10 point review of systems negative.   Past Medical History:  Diagnosis Date  . Allergy   . Fx humeral neck   . Hepatitis   . Hyperlipidemia   . Hypertension   . Skin melanoma ([place])   . Stroke [facility])     Past Surgical History:  Procedure Laterality Date  . Melenoma removal    . TONSILLECTOMY        reports that she has never smoked. She has never used smokeless tobacco. She reports that she does not drink alcohol or use drugs.  Allergies  Allergen Reactions  . Other Other (See Comments)    Powder from latex gloves, aerosol sprays, perfumes - causes cough    Family History  Problem Relation Age of Onset  . Stomach cancer Mother   . Thyroid cancer Father   . Heart attack Father   . Seizures Brother   . Heart failure Brother   . Hypertension Maternal Grandmother     Prior to Admission medications   Medication Sig Start Date End Date Taking? Authorizing Provider  aspirin EC 325 MG tablet Take 650-975 mg by mouth daily as needed (headache).   Yes Historical Provider, MD  aspirin EC 81 MG EC tablet Take 1 tablet (81 mg total) by mouth daily. 07/10/16  Yes Daniel J Angiulli, PA-C  atorvastatin (LIPITOR) 40 MG tablet Take 1 tablet (40 mg total) by mouth daily at 6 PM. 09/04/16  Yes Golden Circle, FNP  lisinopril (PRINIVIL,ZESTRIL) 10 MG tablet Take 1 tablet (10 mg total) by mouth daily. 09/04/16  Yes Golden Circle, FNP  clopidogrel (PLAVIX) 75 MG tablet Take 1 tablet (75 mg total) by mouth daily. Patient not taking: Reported on 09/28/2016 09/04/16   Golden Circle, FNP  docusate sodium (COLACE) 100 MG capsule Take 1 capsule (100 mg total) by mouth 2 (two) times daily. Patient not taking: Reported on 10/23/2016 09/04/16   Golden Circle, FNP  traMADol (ULTRAM) 50 MG tablet Take  1 tablet (50 mg total) by mouth every 6 (six) hours as needed for moderate pain. Patient not taking: Reported on 10/23/2016 07/10/16   Cathlyn Parsons, PA-C    Physical Exam:  Constitutional: NAD, calm, comfortable Vitals:   10/23/16 2045 10/23/16 2100 10/23/16 2210 10/23/16 2333  BP: (!) 162/54 (!) 159/53 (!) 142/47 (!) 136/48  Pulse: 117 111 110 96  Resp: 26 23 (!) 36   Temp:      TempSrc:      SpO2: 97% 95% 97% 97%   Eyes: PERRL, lids and conjunctivae normal ENMT: Mucous membranes  are moist. Posterior pharynx clear of any exudate or lesions.Normal dentition.  Neck: normal, supple, no masses, no thyromegaly Respiratory: Tachypneic with mild expiratory wheezes. Cardiovascular: Regular rate and rhythm, no murmurs / rubs / gallops. No extremity edema. 2+ pedal pulses. No carotid bruits.  Abdomen: no tenderness, no masses palpated. No hepatosplenomegaly. Bowel sounds positive.  Musculoskeletal: no clubbing / cyanosis. No joint deformity upper and lower extremities. Good ROM, no contractures. Normal muscle tone.  Skin: no rashes, lesions, ulcers. No induration Neurologic: CN 2-12 grossly intact. Sensation intact, DTR normal. Strength 5/5 in all 4. Speech is is mildly slurred  Psychiatric: Normal judgment and insight. Alert and oriented x 3. Normal mood.     Labs on Admission: I have personally reviewed following labs and imaging studies  CBC:  Recent Labs Lab 10/23/16 1456 10/23/16 1536  WBC 9.8  --   NEUTROABS 7.2  --   HGB 13.9 14.3  HCT 41.6 42.0  MCV 90.8  --   PLT 290  --    Basic Metabolic Panel:  Recent Labs Lab 10/23/16 1456 10/23/16 1536  NA 139 141  K 3.8 3.7  CL 109 110  CO2 20*  --   GLUCOSE 110* 106*  BUN 10 11  CREATININE 0.75 0.70  CALCIUM 9.4  --    GFR: CrCl cannot be calculated (Unknown ideal weight.). Liver Function Tests:  Recent Labs Lab 10/23/16 1456  AST 23  ALT 20  ALKPHOS 70  BILITOT 0.9  PROT 7.1  ALBUMIN 3.9   No results for input(s): LIPASE, AMYLASE in the last 168 hours. No results for input(s): AMMONIA in the last 168 hours. Coagulation Profile:  Recent Labs Lab 10/23/16 1650  INR 1.00   Cardiac Enzymes: No results for input(s): CKTOTAL, CKMB, CKMBINDEX, TROPONINI in the last 168 hours. BNP (last 3 results) No results for input(s): PROBNP in the last 8760 hours. HbA1C: No results for input(s): HGBA1C in the last 72 hours. CBG:  Recent Labs Lab 10/23/16 1506  GLUCAP 115*   Lipid Profile: No  results for input(s): CHOL, HDL, LDLCALC, TRIG, CHOLHDL, LDLDIRECT in the last 72 hours. Thyroid Function Tests: No results for input(s): TSH, T4TOTAL, FREET4, T3FREE, THYROIDAB in the last 72 hours. Anemia Panel: No results for input(s): VITAMINB12, FOLATE, FERRITIN, TIBC, IRON, RETICCTPCT in the last 72 hours. Urine analysis:    Component Value Date/Time   COLORURINE YELLOW 10/23/2016 2149   APPEARANCEUR HAZY (A) 10/23/2016 2149   LABSPEC 1.020 10/23/2016 2149   PHURINE 5.0 10/23/2016 2149   GLUCOSEU NEGATIVE 10/23/2016 2149   HGBUR NEGATIVE 10/23/2016 2149   [place] NEGATIVE 10/23/2016 2149   [place] NEGATIVE 10/23/2016 2149   PROTEINUR NEGATIVE 10/23/2016 2149   NITRITE NEGATIVE 10/23/2016 2149   LEUKOCYTESUR NEGATIVE 10/23/2016 2149   Sepsis Labs: No results found for this or any previous visit (from the past 240 hour(s)).   Radiological  Exams on Admission: Dg Chest 2 View  Result Date: 10/23/2016 CLINICAL DATA:  Cough EXAM: CHEST  2 VIEW COMPARISON:  Jan 08, 2011 FINDINGS: There is no edema or consolidation. The heart size and pulmonary vascularity are normal. No adenopathy. There is atherosclerotic calcification in the aorta. There is remodeling and proximal left humerus from old trauma. IMPRESSION: No edema or consolidation.  Aortic atherosclerosis. Electronically Signed   By: Lowella Grip III M.D.   On: 10/23/2016 17:08   Ct Head Wo Contrast  Result Date: 10/23/2016 CLINICAL DATA:  81 year old female with a history of headache EXAM: CT HEAD WITHOUT CONTRAST TECHNIQUE: Contiguous axial images were obtained from the base of the skull through the vertex without intravenous contrast. COMPARISON:  None. FINDINGS: Brain: No acute intracranial hemorrhage. No midline shift. No mass effect. Unremarkable appearance of the ventricular system. Vascular: Calcifications of the intracranial vasculature. Skull: No displaced fracture. No aggressive bony lesion. Mastoid air cells  clear. Sinuses/Orbits: Bilateral optic drusen.  No paranasal sinus disease. Other: None. IMPRESSION: No CT evidence of acute intracranial abnormality Signed, Dulcy Fanny. Earleen Newport, DO [facility] Electronically Signed   By: Corrie Mckusick D.O.   On: 10/23/2016 16:25   Ct Angio Chest Pe W And/or Wo Contrast  Result Date: 10/23/2016 CLINICAL DATA:  Headaches and dysarthria. History of stroke in the past. Recent cough and flu. EXAM: CT ANGIOGRAPHY CHEST WITH CONTRAST TECHNIQUE: Multidetector CT imaging of the chest was performed using the standard protocol during bolus administration of intravenous contrast. Multiplanar CT image reconstructions and MIPs were obtained to evaluate the vascular anatomy. CONTRAST:  80 mL Isovue 370 COMPARISON:  None. FINDINGS: Cardiovascular: Examination is technically limited due to motion artifact. Central pulmonary arteries are well opacified without evidence of any filling defect. Nothing to suggest pulmonary embolus. Normal heart size. Calcification in the mitral valve annulus. No pericardial effusion. Normal caliber thoracic aorta. No dissection. Calcific and noncalcific atherosclerotic changes are demonstrated in the aorta and great vessel origins. Great vessels are patent. Coronary artery calcifications. Mediastinum/Nodes: No significant lymphadenopathy in the chest. Small esophageal hiatal hernia. Esophagus is not significantly dilated but there is residual material in the esophagus. This likely represents reflux or dysmotility. Lungs/Pleura: Evaluation of lungs is severely limited due to motion artifact. There is evidence of emphysema. Scarring in the lung apices and lung bases. Patchy mosaic pattern to the lungs could be due to motion artifact, air trapping, pneumonia, or edema. No pleural effusions. No pneumothorax. Airways are patent. Upper Abdomen: No acute process identified. Musculoskeletal: Degenerative changes in the  spine. No destructive bone lesions. Review of the MIP images confirms the above findings. IMPRESSION: No evidence of significant pulmonary embolus. Aortic atherosclerosis. Small esophageal hiatal hernia with residual material in the esophagus suggesting reflux or dysmotility. Emphysematous changes and fibrosis in the lungs. Patchy mosaic pattern could be due to air trapping, motion artifact, edema, or pneumonia. Electronically Signed   By: Lucienne Capers M.D.   On: 10/23/2016 22:33   Mr Brain Wo Contrast  Result Date: 10/23/2016 CLINICAL DATA:  Initial evaluation for acute dysarthria. EXAM: MRI HEAD WITHOUT CONTRAST TECHNIQUE: Multiplanar, multiecho pulse sequences of the brain and surrounding structures were obtained without intravenous contrast. COMPARISON:  Prior CT from earlier the same day. FINDINGS: Brain: Age appropriate cerebral volume loss present. Patchy T2/FLAIR hyperintensity within the periventricular and deep white matter both cerebral hemispheres most consistent with chronic small vessel ischemic disease, mild in nature. Small focus of serpiginous  encephalomalacia within the left inferior pons likely reflective of a remote infarct. No abnormal foci of restricted diffusion to suggest acute or subacute ischemia. Gray-white matter differentiation maintained. No evidence for acute or chronic intracranial hemorrhage. No other areas of chronic infarction. No mass lesion, midline shift or mass effect. Ventricles normal size without evidence for hydrocephalus. No extra-axial fluid collection. Major dural sinuses are grossly patent. Pituitary gland and suprasellar region within normal limits. Vascular: Major intracranial vascular flow voids are largely maintained. Vertebrobasilar system is diminutive. Skull and upper cervical spine: Craniocervical junction within normal limits. Visualized upper cervical spine unremarkable. Bone marrow signal intensity within normal limits. No scalp soft tissue  abnormality. Sinuses/Orbits: Globes and orbital soft tissues within normal limits. Scattered mucosal thickening within the ethmoidal air cells. Paranasal sinuses are otherwise clear. Trace bilateral mastoid effusions, of doubtful significance. Inner ear structures grossly normal. IMPRESSION: 1. No acute intracranial process identified. 2. Small remote left pontine infarct. 3. Mild chronic microvascular ischemic disease. Electronically Signed   By: Jeannine Boga M.D.   On: 10/23/2016 23:39    EKG: Independently reviewed. Sinus tachycardia at 130 bpm  Assessment/Plan  Community acquired pneumonia: Acute. As seen on CT angiogram of the chest.   - Admit to telemetry bed - Empiric antibiotics of ceftriaxone and azithromycin - Follow up blood and sputum cultures - Follow-up respiratory viral panel and urine strep/Legionella  - DuoNeb prn SOB/Wheezing  - Mucinex  Headache: Patient reports bandlike headache. Question complex migraine causing worsening of previous stroke symptoms as MRI of brain negative. Given magnesium sulfate and Toradol in the ED with resolution of symptoms. - Continue to monitor  Dysarthria and generalized weakness:MRI negative. Neurology recommended no need further workup. - Continue to monitor  History of CVA with residual right sided weakness: Patient noted to have a left pontine stroke in October 2017 with reported dysarthria and weakness. - Continue aspirin  Essential hypertension - Continue lisinopril  Hyperlipidemia - Continue atorvastatin  DVT prophylaxis:  Lovenox  Code Status: Full Family Communication: I plan of care with patient and family present at bedside  Disposition Plan: Possible discharge in 1-2 days  Consults called: Neurology  Admission status: Observation  Norval Morton MD [facility] Pager [phone]  If 7PM-7AM, please contact night-coverage [email or web address] Password [facility]  10/24/2016, 12:07 AM

## 2016-10-24 NOTE — Discharge Instructions (Signed)
Community-Acquired Pneumonia, Adult Introduction Pneumonia is an infection of the lungs. One type of pneumonia can happen while a person is in a hospital. A different type can happen when a person is not in a hospital (community-acquired pneumonia). It is easy for this kind to spread from person to person. It can spread to you if you breathe near an infected person who coughs or sneezes. Some symptoms include:  A dry cough.  A wet (productive) cough.  Fever.  Sweating.  Chest pain. Follow these instructions at home:  Take over-the-counter and prescription medicines only as told by your doctor.  Only take cough medicine if you are losing sleep.  If you were prescribed an antibiotic medicine, take it as told by your doctor. Do not stop taking the antibiotic even if you start to feel better.  Sleep with your head and neck raised (elevated). You can do this by putting a few pillows under your head, or you can sleep in a recliner.  Do not use tobacco products. These include cigarettes, chewing tobacco, and e-cigarettes. If you need help quitting, ask your doctor.  Drink enough water to keep your pee (urine) clear or pale yellow. A shot (vaccine) can help prevent pneumonia. Shots are often suggested for:  People older than 79 years of age.  People older than 79 years of age:  Who are having cancer treatment.  Who have long-term (chronic) lung disease.  Who have problems with their body's defense system (immune system). You may also prevent pneumonia if you take these actions:  Get the flu (influenza) shot every year.  Go to the dentist as often as told.  Wash your hands often. If soap and water are not available, use hand sanitizer. Contact a doctor if:  You have a fever.  You lose sleep because your cough medicine does not help. Get help right away if:  You are short of breath and it gets worse.  You have more chest pain.  Your sickness gets worse. This is very  serious if:  You are an older adult.  Your body's defense system is weak.  You cough up blood. This information is not intended to replace advice given to you by your health care provider. Make sure you discuss any questions you have with your health care provider. Document Released: 02/10/2008 Document Revised: 01/30/2016 Document Reviewed: 12/19/2014  2017 Elsevier  Tension Headache A tension headache is pain, pressure, or aching that is felt over the front and sides of your head. These headaches can last from 30 minutes to several days. Follow these instructions at home: Managing pain Take over-the-counter and prescription medicines only as told by your doctor. Lie down in a dark, quiet room when you have a headache. If directed, apply ice to your head and neck area: Put ice in a plastic bag. Place a towel between your skin and the bag. Leave the ice on for 20 minutes, 2-3 times per day. Use a heating pad or a hot shower to apply heat to your head and neck area as told by your doctor. Eating and drinking Eat meals on a regular schedule. Do not drink a lot of alcohol. Do not use a lot of caffeine, or stop using caffeine. General instructions Keep all follow-up visits as told by your doctor. This is important. Keep a journal to find out if certain things bring on headaches. For example, write down: What you eat and drink. How much sleep you get. Any change to your  diet or medicines. Try getting a massage, or doing other things that help you to relax. Lessen stress. Sit up straight. Do not tighten (tense) your muscles. Do not use tobacco products. This includes cigarettes, chewing tobacco, or e-cigarettes. If you need help quitting, ask your doctor. Exercise regularly as told by your doctor. Get enough sleep. This may mean 7-9 hours of sleep. Contact a doctor if: Your symptoms are not helped by medicine. You have a headache that feels different from your usual headache. You  feel sick to your stomach (nauseous) or you throw up (vomit). You have a fever. Get help right away if: Your headache becomes very bad. You keep throwing up. You have a stiff neck. You have trouble seeing. You have trouble speaking. You have pain in your eye or ear. Your muscles are weak or you lose muscle control. You lose your balance or you have trouble walking. You feel like you will pass out (faint) or you pass out. You have confusion. This information is not intended to replace advice given to you by your health care provider. Make sure you discuss any questions you have with your health care provider. Document Released: 11/18/2009 Document Revised: 04/23/2016 Document Reviewed: 12/17/2014 Elsevier Interactive Patient Education  2017 [place] Follow with Primary MD Mauricio Po, FNP in 7 days   Get CBC, CMP, 2 view Chest X ray checked  by Primary MD next visit.    Activity: As tolerated with Full fall precautions use walker/cane & assistance as needed   Disposition Home    Diet: Heart Healthy , with feeding assistance and aspiration precautions.  For Heart failure patients - Check your Weight same time everyday, if you gain over 2 pounds, or you develop in leg swelling, experience more shortness of breath or chest pain, call your Primary MD immediately. Follow Cardiac Low Salt Diet and 1.5 lit/day fluid restriction.   On your next visit with your primary care physician please Get Medicines reviewed and adjusted.   Please request your Prim.MD to go over all Hospital Tests and Procedure/Radiological results at the follow up, please get all Hospital records sent to your Prim MD by signing hospital release before you go home.   If you experience worsening of your admission symptoms, develop shortness of breath, life threatening emergency, suicidal or homicidal thoughts you must seek medical attention immediately by calling 911 or calling your MD immediately  if  symptoms less severe.  You Must read complete instructions/literature along with all the possible adverse reactions/side effects for all the Medicines you take and that have been prescribed to you. Take any new Medicines after you have completely understood and accpet all the possible adverse reactions/side effects.   Do not drive, operating heavy machinery, perform activities at heights, swimming or participation in water activities or provide baby sitting services if your were admitted for syncope or siezures until you have seen by Primary MD or a Neurologist and advised to do so again.  Do not drive when taking Pain medications.    Do not take more than prescribed Pain, Sleep and Anxiety Medications  Special Instructions: If you have smoked or chewed Tobacco  in the last 2 yrs please stop smoking, stop any regular Alcohol  and or any Recreational drug use.  Wear Seat belts while driving.   Please note  You were cared for by a hospitalist during your hospital stay. If you have any questions about your discharge medications or the care you received while you  were in the hospital after you are discharged, you can call the unit and asked to speak with the hospitalist on call if the hospitalist that took care of you is not available. Once you are discharged, your primary care physician will handle any further medical issues. Please note that NO REFILLS for any discharge medications will be authorized once you are discharged, as it is imperative that you return to your primary care physician (or establish a relationship with a primary care physician if you do not have one) for your aftercare needs so that they can reassess your need for medications and monitor your lab values.

## 2016-10-24 NOTE — Evaluation (Signed)
Physical Therapy Evaluation Patient Details Name: Melanie Hendricks MRN: [id number] DOB: 06-24-1936 Today's Date: 10/24/2016   History of Present Illness  Melanie Hendricks is a 81 y.o. female with medical history significant of HTN, HLD, and recent pontine CVA with residual right-sided weakness in 06/2016; who presents with complaints of headache and slurred speech.  Clinical Impression  Pt presenting with generalized weakness and fatigue from prolonged illness. Acute PT to follow to progress indep with mobiltiy. Pt with SOB/DOE with amb but quickly recovered after seated rest break. SpO2 >96% on RA. HR 117    Follow Up Recommendations Outpatient PT;Supervision - Intermittent    Equipment Recommendations  None recommended by PT    Recommendations for Other Services       Precautions / Restrictions Precautions Precautions: Fall Restrictions Weight Bearing Restrictions: No      Mobility  Bed Mobility               General bed mobility comments: pt up in chair upon PT arrival  Transfers Overall transfer level: Needs assistance Equipment used: None Transfers: Sit to/from Stand Sit to Stand: Min guard         General transfer comment: increased time, pt pushed off arm rest with UEs  Ambulation/Gait Ambulation/Gait assistance: Min guard Ambulation Distance (Feet): 160 Feet Assistive device: None Gait Pattern/deviations: Step-through pattern;Decreased stride length;Wide base of support;Staggering left;Staggering right Gait velocity: slow Gait velocity interpretation: Below normal speed for age/gender General Gait Details: pt mildly unsteady but no over episodes of LOB. pt with decreased step height and length  Stairs            Wheelchair Mobility    Modified Rankin (Stroke Patients Only)       Balance Overall balance assessment: Needs assistance Sitting-balance support: Feet supported;No upper extremity supported Sitting balance-Leahy Scale: Good      Standing balance support: During functional activity Standing balance-Leahy Scale: Fair Standing balance comment: pt able to wash hands at sink without difficulty                             Pertinent Vitals/Pain Pain Assessment: 0-10 Pain Score: 2  Pain Location: headache Pain Descriptors / Indicators: Headache Pain Intervention(s): Monitored during session    Home Living Family/patient expects to be discharged to:: Private residence Living Arrangements: Children Available Help at Discharge: Family;Available PRN/intermittently (son and dtr-in-law work during the day) Type of Home: House Home Access: Stairs to enter Entrance Stairs-Rails: None Entrance Stairs-Number of Steps: 2 small steps Home Layout: One level Home Equipment: Environmental consultant - 2 wheels;Cane - single point;Tub bench      Prior Function Level of Independence: Independent         Comments: pt was alone during day, didn't use AD until last week when she had flu she used RW     Hand Dominance   Dominant Hand: Right    Extremity/Trunk Assessment   Upper Extremity Assessment Upper Extremity Assessment: LUE deficits/detail;RUE deficits/detail RUE Deficits / Details: grossly 4/5 LUE Deficits / Details: grossly 3/5 (broke arm in 05/2016)    Lower Extremity Assessment Lower Extremity Assessment: Generalized weakness    Cervical / Trunk Assessment Cervical / Trunk Assessment: Normal  Communication   Communication: No difficulties  Cognition Arousal/Alertness: Awake/alert Behavior During Therapy: WFL for tasks assessed/performed Overall Cognitive Status: Within Functional Limits for tasks assessed  General Comments      Exercises     Assessment/Plan    PT Assessment Patient needs continued PT services  PT Problem List Decreased strength;Decreased activity tolerance;Decreased balance;Decreased mobility          PT Treatment Interventions DME  instruction;Gait training;Stair training;Functional mobility training;Therapeutic activities;Therapeutic exercise;Balance training    PT Goals (Current goals can be found in the Care Plan section)  Acute Rehab PT Goals Patient Stated Goal: home PT Goal Formulation: With patient Time For Goal Achievement: 10/31/16 Potential to Achieve Goals: Good    Frequency Min 3X/week   Barriers to discharge Decreased caregiver support alone during the day    Co-evaluation               End of Session Equipment Utilized During Treatment: Gait belt Activity Tolerance: Patient tolerated treatment well Patient left: in chair;with call bell/phone within reach;with family/visitor present Nurse Communication: Mobility status    Functional Assessment Tool Used: clinical judgemen Functional Limitation: Mobility: Walking and moving around Mobility: Walking and Moving Around Current Status [id number]): At least 20 percent but less than 40 percent impaired, limited or restricted Mobility: Walking and Moving Around Goal Status [phone]): At least 1 percent but less than 20 percent impaired, limited or restricted    Time: 0827-0858 PT Time Calculation (min) (ACUTE ONLY): 31 min   Charges:   PT Evaluation $PT Eval Moderate Complexity: 1 Procedure PT Treatments $Gait Training: 8-22 mins   PT G Codes:   PT G-Codes **NOT FOR INPATIENT CLASS** Functional Assessment Tool Used: clinical judgemen Functional Limitation: Mobility: Walking and moving around Mobility: Walking and Moving Around Current Status [id number]): At least 20 percent but less than 40 percent impaired, limited or restricted Mobility: Walking and Moving Around Goal Status [phone]): At least 1 percent but less than 20 percent impaired, limited or restricted    Berline Lopes 10/24/2016, 9:16 AM   Kittie Plater, PT, DPT Pager #: [phone] Office #: [phone]

## 2016-10-24 NOTE — Discharge Summary (Signed)
Melanie Hendricks, is a 81 y.o. female  DOB 05/09/1936  MRN [id number].  Admission date:  10/23/2016  Admitting Physician  Norval Morton, MD  Discharge Date:  10/24/2016   Primary MD  Mauricio Po, FNP  Recommendations for primary care physician for things to follow:  - Check CBC, BMP during next visit, repeat 2 view chest x-ray in one week.   Admission Diagnosis  Acute non intractable tension-type headache [G44.209] Community acquired pneumonia, unspecified laterality [J18.9]   Discharge Diagnosis  Acute non intractable tension-type headache [G44.209] Community acquired pneumonia, unspecified laterality [J18.9]   Principal Problem:   CAP (community acquired pneumonia) Active Problems:   Left pontine stroke ([place])   Stroke-like symptoms   Benign essential HTN   Headache      Past Medical History:  Diagnosis Date  . Allergy   . Fx humeral neck   . Hepatitis   . Hyperlipidemia   . Hypertension   . Skin melanoma ([place])   . Stroke [facility])     Past Surgical History:  Procedure Laterality Date  . Melenoma removal    . TONSILLECTOMY         History of present illness and  Hospital Course:     Kindly see H&P for history of present illness and admission details, please review complete Labs, Consult reports and Test reports for all details in brief  HPI  from the history and physical done on the day of admission 10/24/2016  HPI: Melanie Hendricks is a 81 y.o. female with medical history significant of HTN, HLD, and recent pontine CVA with residual right-sided weakness in 06/2016; who presents with complaints of headache and slurred speech. Patient states that she woke up this morning with what she describes as  a bandlike headache. She tried taking 3 aspirin without relief of symptoms. Thereafter patient noted slurred speech and eye strain similar to symptoms she had with her previous  stroke. Reports recently stopped Plavix in 09/2016. Associated symptoms include a persistent cough and generalized malaise. 3 weeks ago patient was diagnosed with the flu which she completed a course of Tamiflu. Symptoms initially got better but a productive cough restarted this week. Denies having any recurrent fevers, hemoptysis, dysuria, or significant shortness of breath.  ED Course: In the emergency department was noted to be respirations in the 30's and heart rates into the 130s. CT angiogram revealed no signs of a pulmonary embolus, but was positive for signs of pneumonia.  Patient had been given magnesium sulfate and Toradol to help relieve headache symptoms.She received azithromycin and ceftriaxone for suspected community-acquired pneumonia.  Neurology was consulted to evaluate the patient and recommended MRI. If negative no further workup was needed. Lab work was unremarkable.    Hospital Course   Acute bronchitis  - Some nonspecific finding in CTA chest with suggestion of pneumonia, she is afebrile, has no leukocytosis, her description of symptoms more likely acute bronchitis, will discharge on total 5 days of levofloxacin. - Continue with the Mucinex on discharge -  No wheezing or respiratory distress on physical exam this a.m..  Headache:  Patient reports bandlike headache. Question complex migraine causing worsening of previous stroke symptoms as MRI of brain negative. Given magnesium sulfate and Toradol in the ED.  Dysarthria and generalized weakness: - MRI negative. Neurology recommended no need further workup. - She currently denies any acute symptoms  History of CVA with residual right sided weakness:  Patient noted to have a left pontine stroke in October 2017 with reported dysarthria and weakness. - Continue aspirin  Essential hypertension - Continue lisinopril  Hyperlipidemia - Continue atorvastatin    Discharge Condition:  Stable Daughter at  bedside   Follow UP  Follow-up Information    Mauricio Po, FNP Follow up in 1 week(s).   Specialty:  Family Medicine Contact information: [place] [id number] [phone]             Discharge Instructions  and  Discharge Medications    Discharge Instructions    Discharge instructions    Complete by:  As directed    Follow with Primary MD Mauricio Po, FNP in 7 days   Get CBC, CMP, 2 view Chest X ray checked  by Primary MD next visit.    Activity: As tolerated with Full fall precautions use walker/cane & assistance as needed   Disposition Home    Diet: Heart Healthy , with feeding assistance and aspiration precautions.  For Heart failure patients - Check your Weight same time everyday, if you gain over 2 pounds, or you develop in leg swelling, experience more shortness of breath or chest pain, call your Primary MD immediately. Follow Cardiac Low Salt Diet and 1.5 lit/day fluid restriction.   On your next visit with your primary care physician please Get Medicines reviewed and adjusted.   Please request your Prim.MD to go over all Hospital Tests and Procedure/Radiological results at the follow up, please get all Hospital records sent to your Prim MD by signing hospital release before you go home.   If you experience worsening of your admission symptoms, develop shortness of breath, life threatening emergency, suicidal or homicidal thoughts you must seek medical attention immediately by calling 911 or calling your MD immediately  if symptoms less severe.  You Must read complete instructions/literature along with all the possible adverse reactions/side effects for all the Medicines you take and that have been prescribed to you. Take any new Medicines after you have completely understood and accpet all the possible adverse reactions/side effects.   Do not drive, operating heavy machinery, perform activities at heights, swimming or participation in  water activities or provide baby sitting services if your were admitted for syncope or siezures until you have seen by Primary MD or a Neurologist and advised to do so again.  Do not drive when taking Pain medications.    Do not take more than prescribed Pain, Sleep and Anxiety Medications  Special Instructions: If you have smoked or chewed Tobacco  in the last 2 yrs please stop smoking, stop any regular Alcohol  and or any Recreational drug use.  Wear Seat belts while driving.   Please note  You were cared for by a hospitalist during your hospital stay. If you have any questions about your discharge medications or the care you received while you were in the hospital after you are discharged, you can call the unit and asked to speak with the hospitalist on call if the hospitalist that took care of you is not  available. Once you are discharged, your primary care physician will handle any further medical issues. Please note that NO REFILLS for any discharge medications will be authorized once you are discharged, as it is imperative that you return to your primary care physician (or establish a relationship with a primary care physician if you do not have one) for your aftercare needs so that they can reassess your need for medications and monitor your lab values.   Increase activity slowly    Complete by:  As directed      Allergies as of 10/24/2016      Reactions   Other Other (See Comments)   Powder from latex gloves, aerosol sprays, perfumes - causes cough      Medication List    TAKE these medications   aspirin 81 MG EC tablet Take 1 tablet (81 mg total) by mouth daily. What changed:  Another medication with the same name was removed. Continue taking this medication, and follow the directions you see here.   atorvastatin 40 MG tablet Commonly known as:  LIPITOR Take 1 tablet (40 mg total) by mouth daily at 6 PM.   clopidogrel 75 MG tablet Commonly known as:  PLAVIX Take 1  tablet (75 mg total) by mouth daily.   docusate sodium 100 MG capsule Commonly known as:  COLACE Take 1 capsule (100 mg total) by mouth 2 (two) times daily.   guaiFENesin 600 MG 12 hr tablet Commonly known as:  MUCINEX Take 2 tablets (1,200 mg total) by mouth 2 (two) times daily.   levofloxacin 500 MG tablet Commonly known as:  LEVAQUIN Take 1 tablet (500 mg total) by mouth daily.   lisinopril 10 MG tablet Commonly known as:  PRINIVIL,ZESTRIL Take 1 tablet (10 mg total) by mouth daily.   traMADol 50 MG tablet Commonly known as:  ULTRAM Take 1 tablet (50 mg total) by mouth every 6 (six) hours as needed for moderate pain.         Diet and Activity recommendation: See Discharge Instructions above   Consults obtained -  None   Major procedures and Radiology Reports - PLEASE review detailed and final reports for all details, in brief -     Dg Chest 2 View  Result Date: 10/23/2016 CLINICAL DATA:  Cough EXAM: CHEST  2 VIEW COMPARISON:  Jan 08, 2011 FINDINGS: There is no edema or consolidation. The heart size and pulmonary vascularity are normal. No adenopathy. There is atherosclerotic calcification in the aorta. There is remodeling and proximal left humerus from old trauma. IMPRESSION: No edema or consolidation.  Aortic atherosclerosis. Electronically Signed   By: Lowella Grip III M.D.   On: 10/23/2016 17:08   Ct Head Wo Contrast  Result Date: 10/23/2016 CLINICAL DATA:  81 year old female with a history of headache EXAM: CT HEAD WITHOUT CONTRAST TECHNIQUE: Contiguous axial images were obtained from the base of the skull through the vertex without intravenous contrast. COMPARISON:  None. FINDINGS: Brain: No acute intracranial hemorrhage. No midline shift. No mass effect. Unremarkable appearance of the ventricular system. Vascular: Calcifications of the intracranial vasculature. Skull: No displaced fracture. No aggressive bony lesion. Mastoid air cells clear. Sinuses/Orbits:  Bilateral optic drusen.  No paranasal sinus disease. Other: None. IMPRESSION: No CT evidence of acute intracranial abnormality Signed, Dulcy Fanny. Earleen Newport, DO [facility] Electronically Signed   By: Corrie Mckusick D.O.   On: 10/23/2016 16:25   Ct Angio Chest Pe W And/or Wo Contrast  Result  Date: 10/23/2016 CLINICAL DATA:  Headaches and dysarthria. History of stroke in the past. Recent cough and flu. EXAM: CT ANGIOGRAPHY CHEST WITH CONTRAST TECHNIQUE: Multidetector CT imaging of the chest was performed using the standard protocol during bolus administration of intravenous contrast. Multiplanar CT image reconstructions and MIPs were obtained to evaluate the vascular anatomy. CONTRAST:  80 mL Isovue 370 COMPARISON:  None. FINDINGS: Cardiovascular: Examination is technically limited due to motion artifact. Central pulmonary arteries are well opacified without evidence of any filling defect. Nothing to suggest pulmonary embolus. Normal heart size. Calcification in the mitral valve annulus. No pericardial effusion. Normal caliber thoracic aorta. No dissection. Calcific and noncalcific atherosclerotic changes are demonstrated in the aorta and great vessel origins. Great vessels are patent. Coronary artery calcifications. Mediastinum/Nodes: No significant lymphadenopathy in the chest. Small esophageal hiatal hernia. Esophagus is not significantly dilated but there is residual material in the esophagus. This likely represents reflux or dysmotility. Lungs/Pleura: Evaluation of lungs is severely limited due to motion artifact. There is evidence of emphysema. Scarring in the lung apices and lung bases. Patchy mosaic pattern to the lungs could be due to motion artifact, air trapping, pneumonia, or edema. No pleural effusions. No pneumothorax. Airways are patent. Upper Abdomen: No acute process identified. Musculoskeletal: Degenerative changes in the spine. No destructive  bone lesions. Review of the MIP images confirms the above findings. IMPRESSION: No evidence of significant pulmonary embolus. Aortic atherosclerosis. Small esophageal hiatal hernia with residual material in the esophagus suggesting reflux or dysmotility. Emphysematous changes and fibrosis in the lungs. Patchy mosaic pattern could be due to air trapping, motion artifact, edema, or pneumonia. Electronically Signed   By: Lucienne Capers M.D.   On: 10/23/2016 22:33   Mr Brain Wo Contrast  Result Date: 10/23/2016 CLINICAL DATA:  Initial evaluation for acute dysarthria. EXAM: MRI HEAD WITHOUT CONTRAST TECHNIQUE: Multiplanar, multiecho pulse sequences of the brain and surrounding structures were obtained without intravenous contrast. COMPARISON:  Prior CT from earlier the same day. FINDINGS: Brain: Age appropriate cerebral volume loss present. Patchy T2/FLAIR hyperintensity within the periventricular and deep white matter both cerebral hemispheres most consistent with chronic small vessel ischemic disease, mild in nature. Small focus of serpiginous encephalomalacia within the left inferior pons likely reflective of a remote infarct. No abnormal foci of restricted diffusion to suggest acute or subacute ischemia. Gray-white matter differentiation maintained. No evidence for acute or chronic intracranial hemorrhage. No other areas of chronic infarction. No mass lesion, midline shift or mass effect. Ventricles normal size without evidence for hydrocephalus. No extra-axial fluid collection. Major dural sinuses are grossly patent. Pituitary gland and suprasellar region within normal limits. Vascular: Major intracranial vascular flow voids are largely maintained. Vertebrobasilar system is diminutive. Skull and upper cervical spine: Craniocervical junction within normal limits. Visualized upper cervical spine unremarkable. Bone marrow signal intensity within normal limits. No scalp soft tissue abnormality. Sinuses/Orbits:  Globes and orbital soft tissues within normal limits. Scattered mucosal thickening within the ethmoidal air cells. Paranasal sinuses are otherwise clear. Trace bilateral mastoid effusions, of doubtful significance. Inner ear structures grossly normal. IMPRESSION: 1. No acute intracranial process identified. 2. Small remote left pontine infarct. 3. Mild chronic microvascular ischemic disease. Electronically Signed   By: Jeannine Boga M.D.   On: 10/23/2016 23:39    Micro Results    No results found for this or any previous visit (from the past 240 hour(s)).     Today   Subjective:   Shanda Bumps today ,no chest or  abdominal pain,no new weakness tingling or numbness, and related well with PT, at baseline, has any further headache.  Objective:   Blood pressure 129/62, pulse 92, temperature 98.5 F (36.9 C), temperature source Oral, resp. rate 18, height 5\' 6"  (1.676 m), weight 93.2 kg (205 lb 8 oz), SpO2 93 %.   Intake/Output Summary (Last 24 hours) at 10/24/16 0945 Last data filed at 10/24/16 0410  Gross per 24 hour  Intake             1520 ml  Output                0 ml  Net             1520 ml    Exam Awake Alert, Oriented x 3, No new F.N deficits, Normal affect [place].AT,PERRAL Supple Neck,No JVD, No cervical lymphadenopathy appriciated.  Symmetrical Chest wall movement, Good air movement bilaterally, CTAB RRR,No Gallops,Rubs or new Murmurs, No Parasternal Heave +ve B.Sounds, Abd Soft, Non tender, No organomegaly appriciated, No rebound -guarding or rigidity. No Cyanosis, Clubbing or edema, No new Rash or bruise  Data Review   CBC w Diff:  Lab Results  Component Value Date   WBC 7.7 10/24/2016   HGB 11.3 (L) 10/24/2016   HCT 34.7 (L) 10/24/2016   PLT 250 10/24/2016   LYMPHOPCT 15 10/23/2016   MONOPCT 10 10/23/2016   EOSPCT 1 10/23/2016   BASOPCT 0 10/23/2016    CMP:  Lab Results  Component Value Date   NA 140 10/24/2016   K 4.0 10/24/2016   CL 109  10/24/2016   CO2 22 10/24/2016   BUN 10 10/24/2016   CREATININE 0.83 10/24/2016   PROT 7.1 10/23/2016   ALBUMIN 3.9 10/23/2016   BILITOT 0.9 10/23/2016   ALKPHOS 70 10/23/2016   AST 23 10/23/2016   ALT 20 10/23/2016  .   Total Time in preparing paper work, data evaluation and todays exam - 35 minutes  Baylen Dea M.D on 10/24/2016 at 9:45 AM  [facility]   Office  [phone]

## 2016-10-24 NOTE — ED Notes (Signed)
Nurse drawing labs. 

## 2016-10-26 LAB — LEGIONELLA PNEUMOPHILA SEROGP 1 UR AG: L. PNEUMOPHILA SEROGP 1 UR AG: NEGATIVE

## 2016-10-27 ENCOUNTER — Ambulatory Visit (HOSPITAL_BASED_OUTPATIENT_CLINIC_OR_DEPARTMENT_OTHER): Payer: Medicare Other | Admitting: Physical Medicine & Rehabilitation

## 2016-10-27 ENCOUNTER — Encounter: Payer: Medicare Other | Attending: Physical Medicine & Rehabilitation

## 2016-10-27 ENCOUNTER — Encounter: Payer: Self-pay | Admitting: Physical Medicine & Rehabilitation

## 2016-10-27 VITALS — BP 133/76 | HR 105 | Resp 14

## 2016-10-27 DIAGNOSIS — I6350 Cerebral infarction due to unspecified occlusion or stenosis of unspecified cerebral artery: Secondary | ICD-10-CM

## 2016-10-27 DIAGNOSIS — R5381 Other malaise: Secondary | ICD-10-CM | POA: Diagnosis not present

## 2016-10-27 DIAGNOSIS — R269 Unspecified abnormalities of gait and mobility: Secondary | ICD-10-CM | POA: Diagnosis not present

## 2016-10-27 DIAGNOSIS — I69398 Other sequelae of cerebral infarction: Secondary | ICD-10-CM | POA: Diagnosis not present

## 2016-10-27 DIAGNOSIS — I639 Cerebral infarction, unspecified: Secondary | ICD-10-CM

## 2016-10-27 NOTE — Progress Notes (Signed)
Subjective:    Patient ID: Melanie Hendricks, female    DOB: 1936-08-18, 81 y.o.   MRN: [id number] Left paramedian pontine infarct 06/23/16 HPI Last PMR visit 08/18/16  Admitted with CAP2/16-2/17, Flu for 2 weeks prior to that  Feels like walking tolerance diminished since admit Still mod I ADLs   Pt had 2 more visits of PT prior to flu Left upper extremity has full range of motion, very pleased about OT Pain Inventory Average Pain 1 Pain Right Now 0 My pain is no pain  In the last 24 hours, has pain interfered with the following? General activity 0 Relation with others 0 Enjoyment of life 0 What TIME of day is your pain at its worst? no pain Sleep (in general) Fair  Pain is worse with: no pain Pain improves with: no pain Relief from Meds: no pain  Mobility walk without assistance walk with assistance use a walker how many minutes can you walk? 10-15 ability to climb steps?  yes do you drive?  no  Function retired  Neuro/Psych weakness trouble walking  Prior Studies Any changes since last visit?  no  Physicians involved in your care Any changes since last visit?  no   Family History  Problem Relation Age of Onset  . Stomach cancer Mother   . Thyroid cancer Father   . Heart attack Father   . Seizures Brother   . Heart failure Brother   . Hypertension Maternal Grandmother    Social History   Social History  . Marital status: Legally Separated    Spouse name: N/A  . Number of children: 3  . Years of education: 70   Occupational History  . Retired    Social History Main Topics  . Smoking status: Never Smoker  . Smokeless tobacco: Never Used  . Alcohol use No  . Drug use: No  . Sexual activity: Not Asked   Other Topics Concern  . None   Social History Narrative   Fun: Gardening   Past Surgical History:  Procedure Laterality Date  . Melenoma removal    . TONSILLECTOMY     Past Medical History:  Diagnosis Date  . Allergy   . Fx  humeral neck   . Hepatitis   . Hyperlipidemia   . Hypertension   . Skin melanoma ([place])   . Stroke ([place])    BP 133/76   Pulse (!) 105   Resp 14   SpO2 95%   Opioid Risk Score:   Fall Risk Score:  `1  Depression screen PHQ 2/9  Depression screen [facility] 2/9 08/18/2016 07/21/2016  Decreased Interest 0 0  Down, Depressed, Hopeless 0 0  PHQ - 2 Score 0 0  Altered sleeping - 1  Tired, decreased energy - 1  Change in appetite - 0  Feeling bad or failure about yourself  - 0  Trouble concentrating - 0  Moving slowly or fidgety/restless - 0  Suicidal thoughts - 0  PHQ-9 Score - 2    Review of Systems  HENT: Negative.   Eyes: Negative.   Respiratory: Positive for cough and shortness of breath.        Bronchitis  Cardiovascular: Negative.   Gastrointestinal: Negative.   Endocrine: Negative.   Genitourinary: Negative.   Musculoskeletal: Positive for gait problem.  Skin: Negative.   Allergic/Immunologic: Negative.   Neurological: Positive for weakness.  Hematological: Negative.   Psychiatric/Behavioral: Negative.   All other systems reviewed and are negative.  Objective:   Physical Exam Mildly reduced Left shoulder int rotation  Abd and flex FROM Motor strength is 5 minus right deltoid by specific grip 5 minus, left deltoid, 5 at the biceps, triceps, grip Gen. no acute distress. Mood and affect are appropriate. Speech without dysarthria or aphasia. Mild dysmetria bilateral finger-nose-finger. Right ankle dorsiflexor, 3 plus       Assessment & Plan:  1.  Back pain, This is likely due to her deconditioning from medical illnesses compounded by remobilization May use tylenol  up to 2600 mg per day and heating pad20-46min every 2 hr as needed   Physical medicine and rehabilitation follow-up in 6 weeks. May consider imaging studies if no improvement by next visit  2.  Deconditioning from flu and pneumonia superimposed on Left paramedian pontine CVA, onset in October,  agree with resuming outpatient PT. She  has an order from the hospital.

## 2016-10-27 NOTE — Patient Instructions (Signed)
Melanie Hendricks, acetaminophen dose is 2600 mg per day This is either 2 regular strength Tylenol 4 times a day Or one extra strength Tylenol 5 times per day  please call [facility] to reinstate PT

## 2016-10-28 ENCOUNTER — Ambulatory Visit (INDEPENDENT_AMBULATORY_CARE_PROVIDER_SITE_OTHER): Payer: Medicare Other | Admitting: Family

## 2016-10-28 ENCOUNTER — Encounter: Payer: Self-pay | Admitting: Family

## 2016-10-28 VITALS — BP 144/78 | HR 96 | Temp 97.8°F | Resp 16 | Ht 66.0 in | Wt 195.0 lb

## 2016-10-28 DIAGNOSIS — J209 Acute bronchitis, unspecified: Secondary | ICD-10-CM | POA: Diagnosis not present

## 2016-10-28 DIAGNOSIS — R519 Headache, unspecified: Secondary | ICD-10-CM

## 2016-10-28 DIAGNOSIS — R51 Headache: Secondary | ICD-10-CM

## 2016-10-28 NOTE — Assessment & Plan Note (Signed)
Symptoms of acute bronchitis appear to be resolving with gradual decreasing cough. Instructed to complete last day of antibiotics. Encouraged hydration, rest, and over-the-counter medications as needed for symptom relief and supportive care. Educated regarding pathophysiology of bronchitis. Monitor and follow-up if symptoms worsen or do not improve.

## 2016-10-28 NOTE — Patient Instructions (Addendum)
Thank you for choosing [facility].  SUMMARY AND INSTRUCTIONS:  Medication:  Please continue to take your medications as prescribed.   For headaches - Tylenol as needed and if not controlled use Tramadol.  Your prescription(s) have been submitted to your pharmacy or been printed and provided for you. Please take as directed and contact our office if you believe you are having problem(s) with the medication(s) or have any questions.  Follow up:  If your symptoms worsen or fail to improve, please contact our office for further instruction, or in case of emergency go directly to the emergency room at the closest medical facility.

## 2016-10-28 NOTE — Assessment & Plan Note (Signed)
Headaches appear to be mixed between generalized tension headaches and migraine headache. Cannot rule out coughing as a contributing factor. Denies worst headache of life or neck stiffness. Neurological exam is normal. MRI of the brain was normal with no intracranial pathology or lesions. Continue to monitor conservatively with Tylenol as needed. Continue tramadol as needed for pain uncontrolled by Tylenol. Follow-up if symptoms worsen or frequency increases.

## 2016-10-28 NOTE — Progress Notes (Signed)
Subjective:    Patient ID: Melanie Hendricks, female    DOB: [birth year], 81 y.o.   MRN: [id number]  Chief Complaint  Patient presents with  . Hospitalization Follow-up    has been having issues with headache    HPI:  Melanie Hendricks is a 81 y.o. female who  has a past medical history of Allergy; Fx humeral neck; Hepatitis; Hyperlipidemia; Hypertension; Skin melanoma ([place]); and Stroke ([place]). and presents today for a follow up office visit.   Recently evaluated in the emergency department and admitted to the hospital with the chief complaint of headache which started same morning prior to her arrival with associated nausea and dry heaving. Family was concerned secondary to increased slurred speech. She was noted to be afebrile and hemodynamically stable and in no respiratory distress. No neuro deficits were noted. CT scan of the head showed no acute intracranial processes with an MRI performed per neurology. Chest x-ray was questionable pneumonia. She was admitted for IV fluids and antibiotics for observation. She was diagnosed with acute bronchitis given nonspecific findings in the CTA of the chest concerning for pneumonia. She was discharged on 5 days of levofloxacin. Headache was questionably complex migraine with MRI being negative and unlikely related to stroke. No new strokes were noted on MRI. All hospital records, labs, and imaging were reviewed in detail.    Since leaving the hospital she reports that her symptoms have been improved. She has had a couple of headaches which she treated with extra-strength Tylenol which did not help very much which she ended up taking Tramadol which seemed to help. Headaches are described as achy with some sensitivity to light without sensitivity to sound and no nausea or vomiting. She continues to complete the antibiotic prescribed in the hospital. Denies any fevers or chills. Does continue to have improving cough. Endorses that she continues to have some  difficulty with sleep and notes prior to the stroke she slept about 6 hours at a time which is now decreased to about 4 hours. She has some concern of low blood sugars being associated with her headaches.   Allergies  Allergen Reactions  . Other Other (See Comments)    Powder from latex gloves, aerosol sprays, perfumes - causes cough      Outpatient Medications Prior to Visit  Medication Sig Dispense Refill  . aspirin EC 81 MG EC tablet Take 1 tablet (81 mg total) by mouth daily.    Marland Kitchen atorvastatin (LIPITOR) 40 MG tablet Take 1 tablet (40 mg total) by mouth daily at 6 PM. 90 tablet 0  . guaiFENesin (MUCINEX) 600 MG 12 hr tablet Take 2 tablets (1,200 mg total) by mouth 2 (two) times daily. 40 tablet 0  . levofloxacin (LEVAQUIN) 500 MG tablet Take 1 tablet (500 mg total) by mouth daily. 5 tablet 0  . lisinopril (PRINIVIL,ZESTRIL) 10 MG tablet Take 1 tablet (10 mg total) by mouth daily. 90 tablet 0  . traMADol (ULTRAM) 50 MG tablet Take 1 tablet (50 mg total) by mouth every 6 (six) hours as needed for moderate pain. 30 tablet 0  . clopidogrel (PLAVIX) 75 MG tablet Take 1 tablet (75 mg total) by mouth daily. 14 tablet 0  . docusate sodium (COLACE) 100 MG capsule Take 1 capsule (100 mg total) by mouth 2 (two) times daily. 60 capsule 0   No facility-administered medications prior to visit.       Past Surgical History:  Procedure Laterality Date  . Melenoma removal    .  TONSILLECTOMY        Past Medical History:  Diagnosis Date  . Allergy   . Fx humeral neck   . Hepatitis   . Hyperlipidemia   . Hypertension   . Skin melanoma ([place])   . Stroke [facility])       Review of Systems  Constitutional: Positive for fatigue. Negative for chills and fever.  HENT: Positive for congestion. Negative for sinus pain, sinus pressure, sneezing and sore throat.   Respiratory: Positive for cough. Negative for chest tightness and shortness of breath.   Cardiovascular: Negative for chest pain,  palpitations and leg swelling.  Gastrointestinal: Negative for constipation, diarrhea, nausea and vomiting.  Neurological: Positive for headaches. Negative for dizziness, seizures, syncope, weakness and numbness.  Psychiatric/Behavioral: Positive for sleep disturbance.      Objective:    BP (!) 144/78 (BP Location: Left Arm, Patient Position: Sitting, Cuff Size: Large)   Pulse 96   Temp 97.8 F (36.6 C) (Oral)   Resp 16   Ht 5\' 6"  (1.676 m)   Wt 195 lb (88.5 kg)   SpO2 98%   BMI 31.47 kg/m  Nursing note and vital signs reviewed.  Physical Exam  Constitutional: She is oriented to person, place, and time. She appears well-developed and well-nourished. No distress.  HENT:  Right Ear: Hearing, tympanic membrane, external ear and ear canal normal.  Left Ear: Hearing, tympanic membrane, external ear and ear canal normal.  Nose: Nose normal.  Mouth/Throat: Uvula is midline, oropharynx is clear and moist and mucous membranes are normal.  Eyes: Conjunctivae and EOM are normal. Pupils are equal, round, and reactive to light.  Cardiovascular: Normal rate, regular rhythm, normal heart sounds and intact distal pulses.   Pulmonary/Chest: Effort normal and breath sounds normal. No respiratory distress. She has no wheezes. She has no rales. She exhibits no tenderness.  Neurological: She is alert and oriented to person, place, and time. No cranial nerve deficit.  Skin: Skin is warm and dry.  Psychiatric: She has a normal mood and affect. Her behavior is normal. Judgment and thought content normal.       Assessment & Plan:   Problem List Items Addressed This Visit      Respiratory   Acute bronchitis    Symptoms of acute bronchitis appear to be resolving with gradual decreasing cough. Instructed to complete last day of antibiotics. Encouraged hydration, rest, and over-the-counter medications as needed for symptom relief and supportive care. Educated regarding pathophysiology of bronchitis.  Monitor and follow-up if symptoms worsen or do not improve.        Other   Headache - Primary    Headaches appear to be mixed between generalized tension headaches and migraine headache. Cannot rule out coughing as a contributing factor. Denies worst headache of life or neck stiffness. Neurological exam is normal. MRI of the brain was normal with no intracranial pathology or lesions. Continue to monitor conservatively with Tylenol as needed. Continue tramadol as needed for pain uncontrolled by Tylenol. Follow-up if symptoms worsen or frequency increases.          I have discontinued Ms. Stoneking's clopidogrel and docusate sodium. I am also having her maintain her aspirin, traMADol, lisinopril, atorvastatin, guaiFENesin, and levofloxacin.    Follow-up: Return in about 2 months (around 12/26/2016).  Mauricio Po, FNP

## 2016-10-29 LAB — CULTURE, BLOOD (ROUTINE X 2)
CULTURE: NO GROWTH
Culture: NO GROWTH

## 2016-10-30 ENCOUNTER — Inpatient Hospital Stay: Payer: Medicare Other | Admitting: Family

## 2016-11-18 ENCOUNTER — Other Ambulatory Visit (INDEPENDENT_AMBULATORY_CARE_PROVIDER_SITE_OTHER): Payer: Medicare Other

## 2016-11-18 ENCOUNTER — Encounter: Payer: Self-pay | Admitting: Family

## 2016-11-18 ENCOUNTER — Ambulatory Visit (INDEPENDENT_AMBULATORY_CARE_PROVIDER_SITE_OTHER): Payer: Medicare Other | Admitting: Family

## 2016-11-18 VITALS — BP 132/68 | HR 91 | Temp 97.6°F | Resp 14 | Ht 66.0 in | Wt 193.0 lb

## 2016-11-18 DIAGNOSIS — Z0001 Encounter for general adult medical examination with abnormal findings: Secondary | ICD-10-CM | POA: Insufficient documentation

## 2016-11-18 DIAGNOSIS — Z23 Encounter for immunization: Secondary | ICD-10-CM | POA: Diagnosis not present

## 2016-11-18 DIAGNOSIS — I639 Cerebral infarction, unspecified: Secondary | ICD-10-CM

## 2016-11-18 DIAGNOSIS — E6609 Other obesity due to excess calories: Secondary | ICD-10-CM

## 2016-11-18 DIAGNOSIS — Z6831 Body mass index (BMI) 31.0-31.9, adult: Secondary | ICD-10-CM | POA: Diagnosis not present

## 2016-11-18 DIAGNOSIS — E2839 Other primary ovarian failure: Secondary | ICD-10-CM

## 2016-11-18 DIAGNOSIS — I6350 Cerebral infarction due to unspecified occlusion or stenosis of unspecified cerebral artery: Secondary | ICD-10-CM | POA: Diagnosis not present

## 2016-11-18 DIAGNOSIS — Z0000 Encounter for general adult medical examination without abnormal findings: Secondary | ICD-10-CM | POA: Insufficient documentation

## 2016-11-18 DIAGNOSIS — E785 Hyperlipidemia, unspecified: Secondary | ICD-10-CM

## 2016-11-18 DIAGNOSIS — I10 Essential (primary) hypertension: Secondary | ICD-10-CM

## 2016-11-18 LAB — LIPID PANEL
Cholesterol: 162 mg/dL (ref 0–200)
HDL: 81.9 mg/dL (ref >39.00)
LDL Cholesterol: 64 mg/dL (ref 0–99)
NONHDL: 80.14
Total CHOL/HDL Ratio: 2
Triglycerides: 80 mg/dL (ref 0.0–149.0)
VLDL: 16 mg/dL (ref 0.0–40.0)

## 2016-11-18 LAB — CBC
HEMATOCRIT: 40.9 % (ref 36.0–46.0)
Hemoglobin: 13.8 g/dL (ref 12.0–15.0)
MCHC: 33.8 g/dL (ref 30.0–36.0)
MCV: 91.2 fl (ref 78.0–100.0)
Platelets: 285 10*3/uL (ref 150.0–400.0)
RBC: 4.48 Mil/uL (ref 3.87–5.11)
RDW: 13.3 % (ref 11.5–15.5)
WBC: 4.3 10*3/uL (ref 4.0–10.5)

## 2016-11-18 LAB — COMPREHENSIVE METABOLIC PANEL
ALK PHOS: 64 U/L (ref 39–117)
ALT: 19 U/L (ref 0–35)
AST: 17 U/L (ref 0–37)
Albumin: 4.2 g/dL (ref 3.5–5.2)
BUN: 16 mg/dL (ref 6–23)
CO2: 27 mEq/L (ref 19–32)
Calcium: 10 mg/dL (ref 8.4–10.5)
Chloride: 107 mEq/L (ref 96–112)
Creatinine, Ser: 0.88 mg/dL (ref 0.40–1.20)
GFR: 66.01 mL/min (ref >60.00)
GLUCOSE: 108 mg/dL — AB (ref 70–99)
POTASSIUM: 4.4 meq/L (ref 3.5–5.1)
Sodium: 141 mEq/L (ref 135–145)
TOTAL PROTEIN: 7.5 g/dL (ref 6.0–8.3)
Total Bilirubin: 0.7 mg/dL (ref 0.2–1.2)

## 2016-11-18 LAB — VITAMIN D 25 HYDROXY (VIT D DEFICIENCY, FRACTURES): VITD: 18.27 ng/mL — AB (ref 30.00–100.00)

## 2016-11-18 MED ORDER — FLUTICASONE FUROATE-VILANTEROL 100-25 MCG/INH IN AEPB: 1.0000 | INHALATION_SPRAY | Freq: Every day | RESPIRATORY_TRACT | 0 refills | Status: DC

## 2016-11-18 NOTE — Assessment & Plan Note (Signed)
Blood pressure stable below goal 140/90. Continue current dosage of lisinopril. Continue monitor blood pressures at home and follow low-sodium diet.

## 2016-11-18 NOTE — Assessment & Plan Note (Addendum)
Most recent lipid profile within therapeutic guidelines. Obtain lipid profile. Continue current dosage of atorvastatin pending lipid profile results.

## 2016-11-18 NOTE — Assessment & Plan Note (Signed)
1) Anticipatory Guidance: Discussed importance of wearing a seatbelt while driving and not texting while driving; changing batteries in smoke detector at least once annually; wearing suntan lotion when outside; eating a balanced and moderate diet; getting physical activity at least 30 minutes per day.  2) Immunizations / Screenings / Labs:  Tetanus updated today. Discussed plan for pneumonia vaccinations. All other immunizations are up to date per recommendations. Due for a dental and vision exam encouraged to be completed independently. Obtain Vitamin D for Vitamin D deficiency screening. Due for bone mineral density test with DEXA order placed. All other screenings are up-to-date per recommendations. Obtain CBC, CMET, and lipid profile.    Overall well exam with risk factors for cardiovascular disease including previous stroke, hypertension, obesity, and dyslipidemia. Currently maintained on medication for management of chronic conditions with no adverse side effects. Continues to have some difficulty with cough decreasing her ability to exercise. Recommend increasing physical activity as tolerated. Continue other healthy lifestyle behaviors and choices. Follow-up prevention exam in 1 year. Follow-up office visit pending blood work and for chronic conditions.

## 2016-11-18 NOTE — Assessment & Plan Note (Signed)
Reviewed and updated patient's medical, surgical, family and social history. Medications and allergies were also reviewed. Basic screenings for depression, activities of daily living, hearing, cognition and safety were performed. Provider list was updated and health plan was provided to the patient.  

## 2016-11-18 NOTE — Assessment & Plan Note (Signed)
BMI of 31. Recommend weight loss of 5-10% of current body weight. Recommend increasing physical activity to 30 minutes of moderate level activity daily. Encourage nutritional intake that focuses on nutrient dense foods and is moderate, varied, and balanced and is low in saturated fats and processed/sugary foods. Continue to monitor.

## 2016-11-18 NOTE — Patient Instructions (Addendum)
Thank you for choosing [facility].  SUMMARY AND INSTRUCTIONS:  Start the [place] daily.  Work to increase your physical activity to 2-3 times per week for 30 minutes or about 10,000 steps.  Continue with working on weight loss.   Follow up with dentistry and vision.  Please schedule an appointment for your bone mineral density.  Medication:  Please continue to take your medications as prescribed.  Your prescription(s) have been submitted to your pharmacy or been printed and provided for you. Please take as directed and contact our office if you believe you are having problem(s) with the medication(s) or have any questions.  Labs:  Please stop by the lab on the lower level of the building for your blood work. Your results will be released to [place] (or called to you) after review, usually within 72 hours after test completion. If any changes need to be made, you will be notified at that same time.  1.) The lab is open from 7:30am to 5:30 pm Monday-Friday 2.) No appointment is necessary 3.) Fasting (if needed) is 6-8 hours after food and drink; black coffee and water are okay    Follow up:  If your symptoms worsen or fail to improve, please contact our office for further instruction, or in case of emergency go directly to the emergency room at the closest medical facility.   Health Maintenance  Topic Date Due  . TETANUS/TDAP  08/27/1955  . DEXA SCAN  08/28/2003  . PNA vac Low Risk Adult (1 of 2 - PCV13) 08/28/2003  . INFLUENZA VACCINE  12/05/2016 (Originally 04/07/2016)     Health Maintenance for Postmenopausal Women Menopause is a normal process in which your reproductive ability comes to an end. This process happens gradually over a span of months to years, usually between the ages of 43 and 14. Menopause is complete when you have missed 12 consecutive menstrual periods. It is important to talk with your health care provider about some of the most common conditions that  affect postmenopausal women, such as heart disease, cancer, and bone loss (osteoporosis). Adopting a healthy lifestyle and getting preventive care can help to promote your health and wellness. Those actions can also lower your chances of developing some of these common conditions. What should I know about menopause? During menopause, you may experience a number of symptoms, such as:  Moderate-to-severe hot flashes.  Night sweats.  Decrease in sex drive.  Mood swings.  Headaches.  Tiredness.  Irritability.  Memory problems.  Insomnia. Choosing to treat or not to treat menopausal changes is an individual decision that you make with your health care provider. What should I know about hormone replacement therapy and supplements? Hormone therapy products are effective for treating symptoms that are associated with menopause, such as hot flashes and night sweats. Hormone replacement carries certain risks, especially as you become older. If you are thinking about using estrogen or estrogen with progestin treatments, discuss the benefits and risks with your health care provider. What should I know about heart disease and stroke? Heart disease, heart attack, and stroke become more likely as you age. This may be due, in part, to the hormonal changes that your body experiences during menopause. These can affect how your body processes dietary fats, triglycerides, and cholesterol. Heart attack and stroke are both medical emergencies. There are many things that you can do to help prevent heart disease and stroke:  Have your blood pressure checked at least every 1-2 years. High blood pressure causes  heart disease and increases the risk of stroke.  If you are 5-7 years old, ask your health care provider if you should take aspirin to prevent a heart attack or a stroke.  Do not use any tobacco products, including cigarettes, chewing tobacco, or electronic cigarettes. If you need help quitting, ask  your health care provider.  It is important to eat a healthy diet and maintain a healthy weight.  Be sure to include plenty of vegetables, fruits, low-fat dairy products, and lean protein.  Avoid eating foods that are high in solid fats, added sugars, or salt (sodium).  Get regular exercise. This is one of the most important things that you can do for your health.  Try to exercise for at least 150 minutes each week. The type of exercise that you do should increase your heart rate and make you sweat. This is known as moderate-intensity exercise.  Try to do strengthening exercises at least twice each week. Do these in addition to the moderate-intensity exercise.  Know your numbers.Ask your health care provider to check your cholesterol and your blood glucose. Continue to have your blood tested as directed by your health care provider. What should I know about cancer screening? There are several types of cancer. Take the following steps to reduce your risk and to catch any cancer development as early as possible. Breast Cancer  Practice breast self-awareness.  This means understanding how your breasts normally appear and feel.  It also means doing regular breast self-exams. Let your health care provider know about any changes, no matter how small.  If you are 28 or older, have a clinician do a breast exam (clinical breast exam or CBE) every year. Depending on your age, family history, and medical history, it may be recommended that you also have a yearly breast X-ray (mammogram).  If you have a family history of breast cancer, talk with your health care provider about genetic screening.  If you are at high risk for breast cancer, talk with your health care provider about having an MRI and a mammogram every year.  Breast cancer (BRCA) gene test is recommended for women who have family members with BRCA-related cancers. Results of the assessment will determine the need for genetic  counseling and BRCA1 and for BRCA2 testing. BRCA-related cancers include these types:  Breast. This occurs in males or females.  Ovarian.  Tubal. This may also be called fallopian tube cancer.  Cancer of the abdominal or pelvic lining (peritoneal cancer).  Prostate.  Pancreatic. Cervical, Uterine, and Ovarian Cancer  Your health care provider may recommend that you be screened regularly for cancer of the pelvic organs. These include your ovaries, uterus, and vagina. This screening involves a pelvic exam, which includes checking for microscopic changes to the surface of your cervix (Pap test).  For women ages 21-65, health care providers may recommend a pelvic exam and a Pap test every three years. For women ages 66-65, they may recommend the Pap test and pelvic exam, combined with testing for human papilloma virus (HPV), every five years. Some types of HPV increase your risk of cervical cancer. Testing for HPV may also be done on women of any age who have unclear Pap test results.  Other health care providers may not recommend any screening for nonpregnant women who are considered low risk for pelvic cancer and have no symptoms. Ask your health care provider if a screening pelvic exam is right for you.  If you have had past treatment  for cervical cancer or a condition that could lead to cancer, you need Pap tests and screening for cancer for at least 20 years after your treatment. If Pap tests have been discontinued for you, your risk factors (such as having a new sexual partner) need to be reassessed to determine if you should start having screenings again. Some women have medical problems that increase the chance of getting cervical cancer. In these cases, your health care provider may recommend that you have screening and Pap tests more often.  If you have a family history of uterine cancer or ovarian cancer, talk with your health care provider about genetic screening.  If you have vaginal  bleeding after reaching menopause, tell your health care provider.  There are currently no reliable tests available to screen for ovarian cancer. Lung Cancer  Lung cancer screening is recommended for adults 20-68 years old who are at high risk for lung cancer because of a history of smoking. A yearly low-dose CT scan of the lungs is recommended if you:  Currently smoke.  Have a history of at least 30 pack-years of smoking and you currently smoke or have quit within the past 15 years. A pack-year is smoking an average of one pack of cigarettes per day for one year. Yearly screening should:  Continue until it has been 15 years since you quit.  Stop if you develop a health problem that would prevent you from having lung cancer treatment. Colorectal Cancer  This type of cancer can be detected and can often be prevented.  Routine colorectal cancer screening usually begins at age 49 and continues through age 47.  If you have risk factors for colon cancer, your health care provider may recommend that you be screened at an earlier age.  If you have a family history of colorectal cancer, talk with your health care provider about genetic screening.  Your health care provider may also recommend using home test kits to check for hidden blood in your stool.  A small camera at the end of a tube can be used to examine your colon directly (sigmoidoscopy or colonoscopy). This is done to check for the earliest forms of colorectal cancer.  Direct examination of the colon should be repeated every 5-10 years until age 93. However, if early forms of precancerous polyps or small growths are found or if you have a family history or genetic risk for colorectal cancer, you may need to be screened more often. Skin Cancer  Check your skin from head to toe regularly.  Monitor any moles. Be sure to tell your health care provider:  About any new moles or changes in moles, especially if there is a change in a  mole's shape or color.  If you have a mole that is larger than the size of a pencil eraser.  If any of your family members has a history of skin cancer, especially at a young age, talk with your health care provider about genetic screening.  Always use sunscreen. Apply sunscreen liberally and repeatedly throughout the day.  Whenever you are outside, protect yourself by wearing long sleeves, pants, a wide-brimmed hat, and sunglasses. What should I know about osteoporosis? Osteoporosis is a condition in which bone destruction happens more quickly than new bone creation. After menopause, you may be at an increased risk for osteoporosis. To help prevent osteoporosis or the bone fractures that can happen because of osteoporosis, the following is recommended:  If you are 17-23 years old, get at  least 1,000 mg of calcium and at least 600 mg of vitamin D per day.  If you are older than age 59 but younger than age 56, get at least 1,200 mg of calcium and at least 600 mg of vitamin D per day.  If you are older than age 73, get at least 1,200 mg of calcium and at least 800 mg of vitamin D per day. Smoking and excessive alcohol intake increase the risk of osteoporosis. Eat foods that are rich in calcium and vitamin D, and do weight-bearing exercises several times each week as directed by your health care provider. What should I know about how menopause affects my mental health? Depression may occur at any age, but it is more common as you become older. Common symptoms of depression include:  Low or sad mood.  Changes in sleep patterns.  Changes in appetite or eating patterns.  Feeling an overall lack of motivation or enjoyment of activities that you previously enjoyed.  Frequent crying spells. Talk with your health care provider if you think that you are experiencing depression. What should I know about immunizations? It is important that you get and maintain your immunizations. These  include:  Tetanus, diphtheria, and pertussis (Tdap) booster vaccine.  Influenza every year before the flu season begins.  Pneumonia vaccine.  Shingles vaccine. Your health care provider may also recommend other immunizations. This information is not intended to replace advice given to you by your health care provider. Make sure you discuss any questions you have with your health care provider. Document Released: 10/16/2005 Document Revised: 03/13/2016 Document Reviewed: 05/28/2015 Elsevier Interactive Patient Education  2017 [employer].

## 2016-11-18 NOTE — Assessment & Plan Note (Signed)
Stable and continues to make positive progress and risk factor reduction. Continue to monitor blood pressure, blood glucose, and cholesterol.

## 2016-11-18 NOTE — Progress Notes (Signed)
Subjective:    Patient ID: Milinda Cave, female    DOB: November 08, 1935, 81 y.o.   MRN: [id number]  Chief Complaint  Patient presents with  . CPE    fasting    HPI:  INIOLUWA BOULAY is a 81 y.o. female who presents today for a Medicare Annual Wellness/Physical exam.    1) Health Maintenance -   Diet - Averaging about 2-3 meals per day consisting of a regular diet; Caffeine intake 1-2 cups daily and 1-2 soft drinks every now and then  Exercise - Limited exercise working on improving.   2) Preventative Exams / Immunizations:  Dental -- Due for exam  Vision --  Due for exam    Health Maintenance  Topic Date Due  . TETANUS/TDAP  08/27/1957  . DEXA SCAN  08/28/2003  . PNA vac Low Risk Adult (1 of 2 - PCV13) 08/28/2003  . INFLUENZA VACCINE  12/05/2016 (Originally 04/07/2016)     Immunization History  Administered Date(s) Administered  . Tdap 11/18/2016    RISK FACTORS  Tobacco History  Smoking Status  . Never Smoker  Smokeless Tobacco  . Never Used     Cardiac risk factors: advanced age (older than 40 for men, 12 for women), dyslipidemia, hypertension and obesity (BMI >= 30 kg/m2).  Depression Screen  Depression screen [facility] 2/9 11/18/2016  Decreased Interest 0  Down, Depressed, Hopeless 0  PHQ - 2 Score 0  Altered sleeping -  Tired, decreased energy -  Change in appetite -  Feeling bad or failure about yourself  -  Trouble concentrating -  Moving slowly or fidgety/restless -  Suicidal thoughts -  PHQ-9 Score -     Activities of Daily Living In your present state of health, do you have any difficulty performing the following activities?:  Driving? No Managing money?  No Feeding yourself? No Getting from bed to chair? No Climbing a flight of stairs? No Preparing food and eating?: No Bathing or showering? No Getting dressed: No Getting to the toilet? No Using the toilet: No Moving around from place to place: No In the past year have you fallen  or had a near fall?: Yes. Complicated by stroke and broken; No falls within the last 6 months.    Home Safety Has smoke detector and wears seat belts. No firearms. No excess sun exposure. Are there smokers in your home (other than you)?  No Do you feel safe at home?  Yes  Hearing Difficulties: No Do you often ask people to speak up or repeat themselves? No Do you experience ringing or noises in your ears? No  Do you have difficulty understanding soft or whispered voices? No    Cognitive Testing  Alert? Yes   Normal Appearance? Yes  Oriented to person? Yes  Place? Yes   Time? Yes  Recall of three objects?  Yes  Can perform simple calculations? Yes  Displays appropriate judgment? Yes  Can read the correct time from a watch face? Yes  Do you feel that you have a problem with memory? No  Do you often misplace items? No   Advanced Directives have been discussed with the patient? Yes   Current Physicians/Providers and Suppliers  1. Terri Piedra, FNP - Internal Medicine 2. Alysia Penna, MD - Physical Medicine  3. Barry Brunner, PT - Physical Therapy 4. Vianne Bulls, OT - Occupational Therapy 5. Antony Contras, MD - Neurology  Indicate any recent Medical Services you may have received from other than  [facility] providers in the past year (date may be approximate).  All answers were reviewed with the patient and necessary referrals were made:  Mauricio Po, [place]   11/18/2016    Allergies  Allergen Reactions  . Other Other (See Comments)    Powder from latex gloves, aerosol sprays, perfumes - causes cough     Outpatient Medications Prior to Visit  Medication Sig Dispense Refill  . aspirin EC 81 MG EC tablet Take 1 tablet (81 mg total) by mouth daily.    Marland Kitchen atorvastatin (LIPITOR) 40 MG tablet Take 1 tablet (40 mg total) by mouth daily at 6 PM. 90 tablet 0  . lisinopril (PRINIVIL,ZESTRIL) 10 MG tablet Take 1 tablet (10 mg total) by mouth daily. 90 tablet 0  . traMADol (ULTRAM) 50  MG tablet Take 1 tablet (50 mg total) by mouth every 6 (six) hours as needed for moderate pain. 30 tablet 0   No facility-administered medications prior to visit.      Past Medical History:  Diagnosis Date  . Allergy   . Fx humeral neck   . Hepatitis   . Hyperlipidemia   . Hypertension   . Skin melanoma ([place])   . Stroke [facility])      Past Surgical History:  Procedure Laterality Date  . Melenoma removal    . TONSILLECTOMY       Family History  Problem Relation Age of Onset  . Stomach cancer Mother   . Thyroid cancer Father   . Heart attack Father   . Seizures Brother   . Heart failure Brother   . Hypertension Maternal Grandmother      Social History   Social History  . Marital status: Legally Separated    Spouse name: N/A  . Number of children: 3  . Years of education: 32   Occupational History  . Retired    Social History Main Topics  . Smoking status: Never Smoker  . Smokeless tobacco: Never Used  . Alcohol use No  . Drug use: No  . Sexual activity: Not on file   Other Topics Concern  . Not on file   Social History Narrative   Fun: Gardening and sewing   Denies abuse and feels safe at home.      Review of Systems  Constitutional: Denies fever, chills, fatigue, or significant weight gain/loss. HENT: Head: Denies headache or neck pain Ears: Denies changes in hearing, ringing in ears, earache, drainage Nose: Denies discharge, stuffiness, itching, nosebleed, sinus pain Throat: Denies sore throat, hoarseness, dry mouth, sores, thrush Eyes: Denies loss/changes in vision, pain, redness, blurry/double vision, flashing lights Cardiovascular: Denies chest pain/discomfort, tightness, palpitations, shortness of breath with activity, difficulty lying down, swelling, sudden awakening with shortness of breath Respiratory: Denies shortness of breath, cough, sputum production, wheezing Gastrointestinal: Denies dysphasia, heartburn, change in appetite, nausea,  change in bowel habits, rectal bleeding, constipation, diarrhea, yellow skin or eyes Genitourinary: Denies frequency, urgency, burning/pain, blood in urine, incontinence, change in urinary strength. Musculoskeletal: Denies muscle/joint pain, stiffness, back pain, redness or swelling of joints, trauma Skin: Denies rashes, lumps, itching, dryness, color changes, or hair/nail changes Neurological: Denies dizziness, fainting, seizures, weakness, numbness, tingling, tremor Psychiatric - Denies nervousness, stress, depression or memory loss Endocrine: Denies heat or cold intolerance, sweating, frequent urination, excessive thirst, changes in appetite Hematologic: Denies ease of bruising or bleeding    Objective:     BP 132/68 (BP Location: Left Arm, Patient Position: Sitting, Cuff Size: Large)   Pulse 91  Temp 97.6 F (36.4 C) (Oral)   Resp 14   Ht 5\' 6"  (1.676 m)   Wt 193 lb (87.5 kg)   SpO2 95%   BMI 31.15 kg/m  Nursing note and vital signs reviewed.   Physical Exam  Constitutional: She is oriented to person, place, and time. She appears well-developed and well-nourished.  HENT:  Head: Normocephalic.  Right Ear: Hearing, tympanic membrane, external ear and ear canal normal.  Left Ear: Hearing, tympanic membrane, external ear and ear canal normal.  Nose: Nose normal.  Mouth/Throat: Uvula is midline, oropharynx is clear and moist and mucous membranes are normal.  Eyes: Conjunctivae and EOM are normal. Pupils are equal, round, and reactive to light.  Neck: Neck supple. No JVD present. No tracheal deviation present. No thyromegaly present.  Cardiovascular: Normal rate, regular rhythm, normal heart sounds and intact distal pulses.  Exam reveals no gallop and no friction rub.   No murmur heard. Pulmonary/Chest: Effort normal. No respiratory distress. She has wheezes. She has no rales. She exhibits no tenderness.  Abdominal: Soft. Bowel sounds are normal. She exhibits no distension and no  mass. There is no tenderness. There is no rebound and no guarding.  Musculoskeletal: Normal range of motion. She exhibits no edema or tenderness.  Lymphadenopathy:    She has no cervical adenopathy.  Neurological: She is alert and oriented to person, place, and time. She has normal reflexes. No cranial nerve deficit. She exhibits normal muscle tone. Coordination normal.  Skin: Skin is warm and dry.  Psychiatric: She has a normal mood and affect. Her behavior is normal. Judgment and thought content normal.       Assessment & Plan:   During the course of the visit the patient was educated and counseled about appropriate screening and preventive services including:    Pneumococcal vaccine   Influenza vaccine  Diabetes screening  Nutrition counseling   Diet review for nutrition referral? Yes ____  Not Indicated _X___   Patient Instructions (the written plan) was given to the patient.  Medicare Attestation I have personally reviewed: The patient's medical and social history Their use of alcohol, tobacco or illicit drugs Their current medications and supplements The patient's functional ability including ADLs,fall risks, home safety risks, cognitive, and hearing and visual impairment Diet and physical activities Evidence for depression or mood disorders  The patient's weight, height, BMI,  have been recorded in the chart.  I have made referrals, counseling, and provided education to the patient based on review of the above and I have provided the patient with a written personalized care plan for preventive services.     Problem List Items Addressed This Visit      Cardiovascular and Mediastinum   Left pontine stroke ([place])    Stable and continues to make positive progress and risk factor reduction. Continue to monitor blood pressure, blood glucose, and cholesterol.      Benign essential HTN    Blood pressure stable below goal 140/90. Continue current dosage of lisinopril.  Continue monitor blood pressures at home and follow low-sodium diet.        Other   Medicare annual wellness visit, subsequent - Primary    Reviewed and updated patient's medical, surgical, family and social history. Medications and allergies were also reviewed. Basic screenings for depression, activities of daily living, hearing, cognition and safety were performed. Provider list was updated and health plan was provided to the patient.       Dyslipidemia, goal LDL below  70    Most recent lipid profile within therapeutic guidelines. Obtain lipid profile. Continue current dosage of atorvastatin pending lipid profile results.      Encounter for general adult medical examination with abnormal findings    1) Anticipatory Guidance: Discussed importance of wearing a seatbelt while driving and not texting while driving; changing batteries in smoke detector at least once annually; wearing suntan lotion when outside; eating a balanced and moderate diet; getting physical activity at least 30 minutes per day.  2) Immunizations / Screenings / Labs:  Tetanus updated today. Discussed plan for pneumonia vaccinations. All other immunizations are up to date per recommendations. Due for a dental and vision exam encouraged to be completed independently. Obtain Vitamin D for Vitamin D deficiency screening. Due for bone mineral density test with DEXA order placed. All other screenings are up-to-date per recommendations. Obtain CBC, CMET, and lipid profile.    Overall well exam with risk factors for cardiovascular disease including previous stroke, hypertension, obesity, and dyslipidemia. Currently maintained on medication for management of chronic conditions with no adverse side effects. Continues to have some difficulty with cough decreasing her ability to exercise. Recommend increasing physical activity as tolerated. Continue other healthy lifestyle behaviors and choices. Follow-up prevention exam in 1 year.  Follow-up office visit pending blood work and for chronic conditions.      Relevant Orders   CBC   Comprehensive metabolic panel   Lipid panel   VITAMIN D 25 Hydroxy (Vit-D Deficiency, Fractures)   Class 1 obesity due to excess calories with serious comorbidity and body mass index (BMI) of 31.0 to 31.9 in adult    BMI of 31. Recommend weight loss of 5-10% of current body weight. Recommend increasing physical activity to 30 minutes of moderate level activity daily. Encourage nutritional intake that focuses on nutrient dense foods and is moderate, varied, and balanced and is low in saturated fats and processed/sugary foods. Continue to monitor.         Other Visit Diagnoses    Estrogen deficiency       Relevant Orders   DG DXA FRACTURE ASSESSMENT   Need for Tdap vaccination       Relevant Orders   Tdap vaccine greater than or equal to 7yo IM (Completed)       I am having Ms. Izquierdo start on fluticasone furoate-vilanterol. I am also having her maintain her aspirin, traMADol, lisinopril, and atorvastatin.   Meds ordered this encounter  Medications  . fluticasone furoate-vilanterol (BREO ELLIPTA) 100-25 MCG/INH AEPB    Sig: Inhale 1 puff into the lungs daily.    Dispense:  14 each    Refill:  0    Order Specific Question:   Supervising Provider    Answer:   Pricilla Holm A [[id number]]     Follow-up: Return in about 3 months (around 02/18/2017), or if symptoms worsen or fail to improve.   Mauricio Po, FNP

## 2016-11-30 ENCOUNTER — Encounter: Payer: Self-pay | Admitting: Occupational Therapy

## 2016-11-30 NOTE — Therapy (Signed)
[facility] [address] [place], [place], [address] Phone: [phone]   Fax:  [phone]  Patient Details  Name: Melanie Hendricks MRN: [id number] Date of Birth: December 26, 1935 Referring Provider:  No ref. provider found  Encounter Date: 11/30/2016  OCCUPATIONAL THERAPY DISCHARGE SUMMARY  Visits from Start of Care: 19  Current functional level related to goals / functional outcomes:     OT Short Term Goals - 09/10/16 0854      OT SHORT TERM GOAL #1   Title Pt will be independent with initial HEP--check STGs 08/11/16   Baseline Pt demonstrated I'ly in clinic and verbalized.   Time 4   Period Weeks   Status Achieved     OT SHORT TERM GOAL #2   Title Pt will improve coordination for ADLs as shown by improving time on 9-hole peg test by 5sec bilaterally.   Baseline R-32.87sec, L-42.41sec; 08/11/16 R= 28.17 sec, L= 31.69 sec   Time 4   Period Weeks   Status Achieved     OT SHORT TERM GOAL #3   Title Pt will demo at least 100* L shoulder flex for functional reaching/ADLs with 3/10 pain or less.   Baseline 90* @ Eval; 08/11/16 = 125* painfree   Time 4   Period Weeks   Status Achieved     OT SHORT TERM GOAL #4   Title Pt will improve L grip strength by at least 5lbs to assist with opening containers/ADLs.   Baseline 15lbs at Eval; 08/11/16 R= 48# vs L = 39#   Time 4   Period Weeks   Status Achieved     OT SHORT TERM GOAL #5   Title Pt will verbalize understanding of memory/cognitive compensation strategies for ADLs prn.   Time 4   Period Weeks   Status Achieved     OT SHORT TERM GOAL #6   Title Pt will perform simple snack prep/home maintenance task mod I.   Time 4   Period Weeks   Status Achieved         OT Long Term Goals - 09/28/16 0825      OT LONG TERM GOAL #1   Title Pt will be independent with updated HEP.--check LTGs 09/11/16   Time 8   Period Weeks   Status Achieved  09/08/16     OT LONG TERM  GOAL #2   Title Pt will improve coordination for ADLs as shown by improving time on 9-hole peg test by 10sec with LUE.   Baseline 42.41sec   Time 8   Period Weeks   Status Achieved     OT LONG TERM GOAL #3   Title Pt will demo at least 110* L shoulder flex for functional reaching/ADLs with less than 2/10 pain.  Updated 08/12/16:  Pt will demo at least 135* L shoulder flex for functional reaching/ADLs with less than 2/10 pain.   Baseline 90*   Time 8   Period Weeks   Status Upgraded goal Not fully met 08/12/16:  met initial goal and upgraded.   09/03/16:  120*;   09/28/16  130-135*     OT LONG TERM GOAL #4   Title Pt will improve L grip strength by at least 15lbs to assist with opening containers/ADLs.  Updated 08/12/16:  Pt will improve L grip strength to at least 42lbs to assist with opening containers/ADLs.   Baseline 15lbs   Time 8   Period Weeks   Status Upgraded goal not met.  08/12/16  met initial goal and upgraded.  09/03/16  34lbs     OT LONG TERM GOAL #5   Title Pt will demo at least 100* L shoulder abduction for lateral reaching/ADLs with less than 3/10 pain.   Baseline 90*   Time 8   Period Weeks   Status Achieved  09/03/16:  115*     OT LONG TERM GOAL #6   Title Pt will perform simple cooking tasks/meal prep mod I.  08/12/16 updated:  Pt will perform mod complex cooking tasks/meal prep mod I.   Time 8   Period Weeks   Status Achieved  08/12/16  met initial goal and upgraded.   09/28/16 met per pt report     OT LONG TERM GOAL #7   Title Pt will use LUE as nondominant assist for ADLs/IADLs at least 85% of the time   Time 8   Period Weeks   Status Achieved  08/12/16 per pt report     OT LONG TERM GOAL #8   Title Pt will demo at least 120* L shoulder abduction for lateral reaching/ADLs with less than 3/10 pain.   Time 4   Period Weeks   Status Not met/unable to reassess        Remaining deficits: decr ROM, decr strength, decr balance for ADLs/IADLs--all  improved   Education / Equipment: Pt educated in HEP and verbalized understanding.  Plan: Patient agrees to discharge.  Patient goals were partially met. Patient is being discharged due to not returning since the last visit.  Pt cancelled last few appts due to illness and did not re-schedule/return.  Therefore, will be discharged at this time. ?????        [facility] 11/30/2016, 8:42 AM  [facility] [address] [place], [place], [address] Phone: [phone]   Fax:  [place], OTR/L [facility] [address] [place], [place]  [phone] phone [phone] 11/30/16 8:42 AM

## 2016-12-02 ENCOUNTER — Ambulatory Visit (INDEPENDENT_AMBULATORY_CARE_PROVIDER_SITE_OTHER)
Admission: RE | Admit: 2016-12-02 | Discharge: 2016-12-02 | Disposition: A | Discharge status: Home / Self Care | Payer: Medicare Other | Source: Ambulatory Visit

## 2016-12-02 DIAGNOSIS — E2839 Other primary ovarian failure: Secondary | ICD-10-CM | POA: Diagnosis not present

## 2016-12-07 ENCOUNTER — Other Ambulatory Visit: Payer: Self-pay | Admitting: *Deleted

## 2016-12-07 MED ORDER — LISINOPRIL 10 MG PO TABS: 10.0000 mg | ORAL_TABLET | Freq: Every day | ORAL | 0 refills | Status: DC

## 2016-12-07 MED ORDER — ATORVASTATIN CALCIUM 40 MG PO TABS: 40.0000 mg | ORAL_TABLET | Freq: Every day | ORAL | 0 refills | Status: DC

## 2016-12-07 NOTE — Telephone Encounter (Signed)
Rec'd call pt requesting refills on her Lisinopril & Atorvastatin. Verified pharmacy inform will send to [facility]...Johny Chess

## 2016-12-08 ENCOUNTER — Ambulatory Visit (HOSPITAL_BASED_OUTPATIENT_CLINIC_OR_DEPARTMENT_OTHER): Payer: Medicare Other | Admitting: Physical Medicine & Rehabilitation

## 2016-12-08 ENCOUNTER — Encounter: Payer: Medicare Other | Attending: Physical Medicine & Rehabilitation

## 2016-12-08 ENCOUNTER — Encounter: Payer: Self-pay | Admitting: Physical Medicine & Rehabilitation

## 2016-12-08 VITALS — BP 132/64 | HR 91 | Resp 14

## 2016-12-08 DIAGNOSIS — I69398 Other sequelae of cerebral infarction: Secondary | ICD-10-CM | POA: Diagnosis not present

## 2016-12-08 DIAGNOSIS — I639 Cerebral infarction, unspecified: Secondary | ICD-10-CM

## 2016-12-08 DIAGNOSIS — I6350 Cerebral infarction due to unspecified occlusion or stenosis of unspecified cerebral artery: Secondary | ICD-10-CM | POA: Insufficient documentation

## 2016-12-08 DIAGNOSIS — R269 Unspecified abnormalities of gait and mobility: Secondary | ICD-10-CM

## 2016-12-08 NOTE — Patient Instructions (Signed)

## 2016-12-08 NOTE — Progress Notes (Signed)
Subjective:    Patient ID: Melanie Hendricks, female    DOB: 04-05-1936, 81 y.o.   MRN: [id number] Left pontine infarct, 06/23/2016. History of left humeral fracture. Just prior to CVA Completed inpatient rehabilitation HPI No more back pain working on posture, planting flowers , cooking and cleaning Independent with self-care, using a cane to ambulate. Asking about driving She has had a bout of bronchitis and is still coughing on occasion. She has not resumed physical therapy yet   Pain Inventory Average Pain 1 Pain Right Now 0 My pain is stabbing  In the last 24 hours, has pain interfered with the following? General activity 0 Relation with others 0 Enjoyment of life 0 What TIME of day is your pain at its worst? n/a Sleep (in general) Fair  Pain is worse with: standing and some activites Pain improves with: rest Relief from Meds: n/a  Mobility walk without assistance how many minutes can you walk? 15 ability to climb steps?  yes do you drive?  no  Function retired  Neuro/Psych weakness  Prior Studies Any changes since last visit?  no  Physicians involved in your care Any changes since last visit?  no   Family History  Problem Relation Age of Onset  . Stomach cancer Mother   . Thyroid cancer Father   . Heart attack Father   . Seizures Brother   . Heart failure Brother   . Hypertension Maternal Grandmother    Social History   Social History  . Marital status: Legally Separated    Spouse name: N/A  . Number of children: 3  . Years of education: 52   Occupational History  . Retired    Social History Main Topics  . Smoking status: Never Smoker  . Smokeless tobacco: Never Used  . Alcohol use No  . Drug use: No  . Sexual activity: Not Asked   Other Topics Concern  . None   Social History Narrative   Fun: Gardening and sewing   Denies abuse and feels safe at home.    Past Surgical History:  Procedure Laterality Date  . Melenoma removal      . TONSILLECTOMY     Past Medical History:  Diagnosis Date  . Allergy   . Fx humeral neck   . Hepatitis   . Hyperlipidemia   . Hypertension   . Skin melanoma ([place])   . Stroke ([place])    BP 132/64 (BP Location: Right Arm, Patient Position: Sitting, Cuff Size: Normal)   Pulse 91   Resp 14   SpO2 93%   Opioid Risk Score:   Fall Risk Score:  `1  Depression screen PHQ 2/9  Depression screen [facility] 2/9 11/18/2016 08/18/2016 07/21/2016  Decreased Interest 0 0 0  Down, Depressed, Hopeless 0 0 0  PHQ - 2 Score 0 0 0  Altered sleeping - - 1  Tired, decreased energy - - 1  Change in appetite - - 0  Feeling bad or failure about yourself  - - 0  Trouble concentrating - - 0  Moving slowly or fidgety/restless - - 0  Suicidal thoughts - - 0  PHQ-9 Score - - 2    Review of Systems  HENT: Negative.   Eyes: Negative.   Respiratory: Positive for shortness of breath.   Cardiovascular: Negative.   Gastrointestinal: Negative.   Endocrine: Negative.   Genitourinary: Negative.   Musculoskeletal: Positive for gait problem.  Skin: Negative.   Allergic/Immunologic: Negative.   Neurological: Positive  for weakness.  Hematological: Negative.   Psychiatric/Behavioral: Negative.   All other systems reviewed and are negative.      Objective:   Physical Exam  Constitutional: She is oriented to person, place, and time. She appears well-developed and well-nourished.  HENT:  Head: Normocephalic and atraumatic.  Eyes: Conjunctivae and EOM are normal. Pupils are equal, round, and reactive to light.  Pulmonary/Chest: Effort normal. No respiratory distress. She has no wheezes.  Neurological: She is alert and oriented to person, place, and time. No sensory deficit. Gait abnormal.  Extra ocular muscles intact. Visual fields are intact to confrontation testing. Ambulates with a cane, but no evidence of toe drag or knee instability. Able to perform finger to thumb opposition. Motor strength is 5  minus/5, bilateral deltoid, biceps, triceps, grip, hip flexor, knee extensor, ankle dorsiflexor.  Psychiatric: She has a normal mood and affect.  Nursing note and vitals reviewed.  Left shoulder range of motion, full external rotation and abduction, limited internal rotation, cannot touch thoracic spinous process       Assessment & Plan:  1. 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 It is recommended that the patient first drives with another licensed driver in an empty parking lot. If the patient does well with this, and they can drive on a quiet street with the licensed driver. If the patient does well with this they can drive on a busy street with a licensed driver. If the patient does well with this, the next time out they can go by himself. For the first month after resuming driving, I recommend no nighttime or Interstate driving.   Physical medicine and rehabilitation follow-up on a when necessary basis

## 2017-01-01 ENCOUNTER — Encounter: Payer: Self-pay | Admitting: *Deleted

## 2017-01-01 DIAGNOSIS — Z006 Encounter for examination for normal comparison and control in clinical research program: Secondary | ICD-10-CM

## 2017-01-01 NOTE — Progress Notes (Addendum)
STROKE-AF Research study month 6 follow up visit completed. See source documents in patient binder.Note: ICF correction: Legal authorization line was signed by Research officer, political party in error. Single line with initials and date and copy of ICF offered to patient and family but they declined.

## 2017-02-15 ENCOUNTER — Ambulatory Visit (INDEPENDENT_AMBULATORY_CARE_PROVIDER_SITE_OTHER): Payer: Medicare Other | Admitting: Nurse Practitioner

## 2017-02-15 ENCOUNTER — Encounter: Payer: Self-pay | Admitting: Nurse Practitioner

## 2017-02-15 VITALS — BP 132/80 | HR 95 | Ht 66.0 in | Wt 190.4 lb

## 2017-02-15 DIAGNOSIS — E785 Hyperlipidemia, unspecified: Secondary | ICD-10-CM

## 2017-02-15 DIAGNOSIS — I10 Essential (primary) hypertension: Secondary | ICD-10-CM

## 2017-02-15 DIAGNOSIS — I6302 Cerebral infarction due to thrombosis of basilar artery: Secondary | ICD-10-CM

## 2017-02-15 NOTE — Patient Instructions (Addendum)
Stressed the importance of management of risk factors to prevent further stroke Continue Aspirinfor secondary stroke prevention Maintain strict control of hypertension with blood pressure goal below 130/90, today's reading 132/80 continue antihypertensive medications Cholesterol with LDL cholesterol less than 70, followed by primary care,   continue statin drug Lipitor  Exercise by walking, slowly increase  home exercise program,  eat healthy diet with whole grains,  fresh fruits and vegetables May resume driving Will discharge from stroke clinic Stroke Prevention Some medical conditions and behaviors are associated with an increased chance of having a stroke. You may prevent a stroke by making healthy choices and managing medical conditions. How can I reduce my risk of having a stroke?  Stay physically active. Get at least 30 minutes of activity on most or all days.  Do not smoke. It may also be helpful to avoid exposure to secondhand smoke.  Limit alcohol use. Moderate alcohol use is considered to be: ? No more than 2 drinks per day for men. ? No more than 1 drink per day for nonpregnant women.  Eat healthy foods. This involves: ? Eating 5 or more servings of fruits and vegetables a day. ? Making dietary changes that address high blood pressure (hypertension), high cholesterol, diabetes, or obesity.  Manage your cholesterol levels. ? Making food choices that are high in fiber and low in saturated fat, trans fat, and cholesterol may control cholesterol levels. ? Take any prescribed medicines to control cholesterol as directed by your health care provider.  Manage your diabetes. ? Controlling your carbohydrate and sugar intake is recommended to manage diabetes. ? Take any prescribed medicines to control diabetes as directed by your health care provider.  Control your hypertension. ? Making food choices that are low in salt (sodium), saturated fat, trans fat, and cholesterol is  recommended to manage hypertension. ? Ask your health care provider if you need treatment to lower your blood pressure. Take any prescribed medicines to control hypertension as directed by your health care provider. ? If you are 49-4 years of age, have your blood pressure checked every 3-5 years. If you are 67 years of age or older, have your blood pressure checked every year.  Maintain a healthy weight. ? Reducing calorie intake and making food choices that are low in sodium, saturated fat, trans fat, and cholesterol are recommended to manage weight.  Stop drug abuse.  Avoid taking birth control pills. ? Talk to your health care provider about the risks of taking birth control pills if you are over 19 years old, smoke, get migraines, or have ever had a blood clot.  Get evaluated for sleep disorders (sleep apnea). ? Talk to your health care provider about getting a sleep evaluation if you snore a lot or have excessive sleepiness.  Take medicines only as directed by your health care provider. ? For some people, aspirin or blood thinners (anticoagulants) are helpful in reducing the risk of forming abnormal blood clots that can lead to stroke. If you have the irregular heart rhythm of atrial fibrillation, you should be on a blood thinner unless there is a good reason you cannot take them. ? Understand all your medicine instructions.  Make sure that other conditions (such as anemia or atherosclerosis) are addressed. Get help right away if:  You have sudden weakness or numbness of the face, arm, or leg, especially on one side of the body.  Your face or eyelid droops to one side.  You have sudden confusion.  You  have trouble speaking (aphasia) or understanding.  You have sudden trouble seeing in one or both eyes.  You have sudden trouble walking.  You have dizziness.  You have a loss of balance or coordination.  You have a sudden, severe headache with no known cause.  You have new  chest pain or an irregular heartbeat. Any of these symptoms may represent a serious problem that is an emergency. Do not wait to see if the symptoms will go away. Get medical help at once. Call your local emergency services (911 in [place] Do not drive yourself to the hospital. This information is not intended to replace advice given to you by your health care provider. Make sure you discuss any questions you have with your health care provider. Document Released: 10/01/2004 Document Revised: 01/30/2016 Document Reviewed: 02/24/2013 Elsevier Interactive Patient Education  2017 [employer].

## 2017-02-15 NOTE — Progress Notes (Signed)
[facility]  PATIENT: Melanie Hendricks DOB: 08/06/1936   REASON FOR VISIT: Follow-up for Left pontine infarct in October 2017 HISTORY FROM: Patient alone at visit    [place] 06/11/2018CM [name], 81 year old female returns for follow-up with history of left pontine infarct October 2017. She is currently on aspirin for secondary stroke prevention without further stroke or TIA symptoms. She has minimal bruising and no bleeding. She remains on Lipitor for hyperlipidemia without myalgias. Blood pressure in the office today 132/80. She had admission in February overnight for acute bronchitis. She ambulates with a single-point cane without falls. She has not resumed driving since her stroke. Her therapies are completed and she is doing her home exercise program. She returns for reevaluation   HISTORY 08/13/16 PSMs [name] is 89 year pleasant Caucasian lady seen today for the first office follow-up visit following hospital admission for stroke in October 2017. Melanie Hendricks an 81 y.o.femalewith no documented systemic medical disorder presenting with new onset slurred speech, tingling and numbness involving left arm and hand, and deviation of eyes to the right side. Patient has no previous history of stroke nor TIA. She has not been taking antiplatelet medication on a routine basis. CT scan of her head showed no acute intracranial abnormality. No clear facial droop was noted by family. Speech apparently has improved but still not quite back to baseline. Patient fractured her left humerus 5 weeks ago and has kept her left upper extremity in a sling. NIH stroke score was 2. She was LKW at 3:00 PM on 06/23/2016. Patient was not administered IV t-PA secondary to Beyond time under for treatment consideration. Shewas admitted for further evaluation and treatment. MRI scan of the brain personally reviewed shows acute nonhemorrhagic infarct in the left  paramedian pons and mild changes of chronic microvascular disease. MRA of the brain showed non-visualization of the left vertebral artery and majority of the basilar artery likely due to occlusion. Transthoracic echo showed normal ejection fraction. LDL cholesterol was 147 mg percent. Hemoglobin A1c was 5.4. Carotid ultrasound showed no significant extracranial stenosis. Patient was started on statin as well as aspirin and Plavix and transferred to rehabilitation. She has done well and made gradual improvement. She was enrolled in the stroke atrial fibrillation trial and was randomized to the standard of care. She is currently at home. She is doing outpatient therapy. She is tolerating aspirin and Plavix and only minor bruising and no bleeding. She states her blood pressure is well controlled though today it is elevated at 162/76 in office. She is tolerating Lipitor well without muscle aches or pains.   REVIEW OF SYSTEMS: Full 14 system review of systems performed and notable only for those listed, all others are neg:  Constitutional: neg  Cardiovascular: neg Ear/Nose/Throat: Hearing loss Skin: neg Eyes: neg Respiratory: Cough Gastroitestinal: neg  Hematology/Lymphatic: neg  Endocrine: neg Musculoskeletal: Back pain Allergy/Immunology: neg Neurological: neg Psychiatric: neg Sleep : neg   ALLERGIES: Allergies  Allergen Reactions  . Other Other (See Comments)    Powder from latex gloves, aerosol sprays, perfumes - causes cough    HOME MEDICATIONS: Outpatient Medications Prior to Visit  Medication Sig Dispense Refill  . aspirin EC 81 MG EC tablet Take 1 tablet (81 mg total) by mouth daily.    Marland Kitchen atorvastatin (LIPITOR) 40 MG tablet Take 1 tablet (40 mg total) by mouth daily at 6 PM. 90 tablet 0  . fluticasone furoate-vilanterol (BREO ELLIPTA) 100-25 MCG/INH AEPB Inhale  1 puff into the lungs daily. 14 each 0  . lisinopril (PRINIVIL,ZESTRIL) 10 MG tablet Take 1 tablet (10 mg total) by mouth  daily. 90 tablet 0  . traMADol (ULTRAM) 50 MG tablet Take 1 tablet (50 mg total) by mouth every 6 (six) hours as needed for moderate pain. 30 tablet 0   No facility-administered medications prior to visit.     PAST MEDICAL HISTORY: Past Medical History:  Diagnosis Date  . Allergy   . Bronchitis   . Fx humeral neck   . Hepatitis   . Hyperlipidemia   . Hypertension   . Skin melanoma ([place])   . Stroke [facility])     PAST SURGICAL HISTORY: Past Surgical History:  Procedure Laterality Date  . Melenoma removal    . TONSILLECTOMY      FAMILY HISTORY: Family History  Problem Relation Age of Onset  . Stomach cancer Mother   . Thyroid cancer Father   . Heart attack Father   . Seizures Brother   . Heart failure Brother   . Hypertension Maternal Grandmother     SOCIAL HISTORY: Social History   Social History  . Marital status: Legally Separated    Spouse name: N/A  . Number of children: 3  . Years of education: 28   Occupational History  . Retired    Social History Main Topics  . Smoking status: Never Smoker  . Smokeless tobacco: Never Used  . Alcohol use No  . Drug use: No  . Sexual activity: Not on file   Other Topics Concern  . Not on file   Social History Narrative   Fun: Gardening and sewing   Denies abuse and feels safe at home.    Lives w/ her son and daughter-in-law   Right-handed   Caffeine: 1 cup of coffee and 1 Coke per day     PHYSICAL EXAM  Vitals:   02/15/17 0809  BP: (!) 132/80   Pulse: 95  Weight: 190 lb 6.4 oz (86.4 kg)  Height: 5\' 6"  (1.676 m)   Body mass index is 30.73 kg/m.  Generalized: Well developed, Mildly obese in no acute distress  Head: normocephalic and atraumatic,. Oropharynx benign  Neck: Supple, no carotid bruits  Cardiac: Regular rate rhythm, no murmur  Musculoskeletal: No deformity   Neurological examination   Mentation: Alert oriented to time, place, history taking. Attention span and concentration appropriate.  Recent and remote memory intact.  Follows all commands speech and language fluent.   Cranial nerve II-XII: .Pupils were equal round reactive to light extraocular movements were full, visual field were full on confrontational test. Facial sensation and strength were normal. hearing was intact to finger rubbing bilaterally. Uvula tongue midline. head turning and shoulder shrug were normal and symmetric.Tongue protrusion into cheek strength was normal. Motor: normal bulk and tone, full strength in the BUE, BLE, fine finger movements normal, no pronator drift. No focal weakness Sensory: normal and symmetric to light touch, pinprick, and  Vibration, in the upper and lower extremities  Coordination: finger-nose-finger, heel-to-shin bilaterally, no dysmetria Reflexes: 1+ upper lower and symmetric, plantar responses were flexor bilaterally. Gait and Station: Rising up from seated position without assistance, normal stance,  moderate stride, good arm swing, smooth turning, able to perform tiptoe, and heel walking without difficulty. Tandem gait is steady  DIAGNOSTIC DATA (LABS, IMAGING, TESTING) - I reviewed patient records, labs, notes, testing and imaging myself where available.  Lab Results  Component Value Date   WBC 4.3  11/18/2016   HGB 13.8 11/18/2016   HCT 40.9 11/18/2016   MCV 91.2 11/18/2016   PLT 285.0 11/18/2016      Component Value Date/Time   NA 141 11/18/2016 0914   K 4.4 11/18/2016 0914   CL 107 11/18/2016 0914   CO2 27 11/18/2016 0914   GLUCOSE 108 (H) 11/18/2016 0914   BUN 16 11/18/2016 0914   CREATININE 0.88 11/18/2016 0914   CALCIUM 10.0 11/18/2016 0914   PROT 7.5 11/18/2016 0914   ALBUMIN 4.2 11/18/2016 0914   AST 17 11/18/2016 0914   ALT 19 11/18/2016 0914   ALKPHOS 64 11/18/2016 0914   BILITOT 0.7 11/18/2016 0914   GFRNONAA >60 10/24/2016 0218   GFRAA >60 10/24/2016 0218   Lab Results  Component Value Date   CHOL 162 11/18/2016   HDL 81.90 11/18/2016   LDLCALC  64 11/18/2016   TRIG 80.0 11/18/2016   CHOLHDL 2 11/18/2016   Lab Results  Component Value Date   HGBA1C 5.4 06/24/2016    ASSESSMENT AND PLAN 22 year lady with Left pontine infarct in October 2017 due to 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 Vascular risk factors of hypertension hyperlipidemia. The patient is a current patient of Dr. Leonie Man  who is out of the office today . This note is sent to the work in doctor.     PLAN: Stressed the importance of management of risk factors to prevent further stroke Continue Aspirin for secondary stroke prevention Maintain strict control of hypertension with blood pressure goal below 130/90, today's reading 132/80 continue antihypertensive medications Cholesterol with LDL cholesterol less than 70, labs followed by primary care,   continue statin drug Lipitor  Exercise by walking, slowly increase  home exercise program,  eat healthy diet with whole grains,  fresh fruits and vegetables May resume driving , first go to a parking lot and practicing with a licensed driver so that you feel comfortable. No interstate driving Will discharge from stroke clinic I spent 25 minutes in total face to face time with the patient more than 50% of which was spent counseling and coordination of care, reviewing test results reviewing medications and discussing and reviewing the diagnosis of stroke and management of risk factors Dennie Bible, [facility], [facility], APRN  [facility] [address] [place], [place] [address] [phone]

## 2017-02-15 NOTE — Progress Notes (Signed)
I have read the note, and I agree with the clinical assessment and plan.  Richard A. Sater, MD, PhD, FAAN Certified in Neurology, Clinical Neurophysiology, Sleep Medicine, Pain Medicine and Neuroimaging  [facility] [address], [address] [place], [place] [address] [phone]  

## 2017-03-09 ENCOUNTER — Other Ambulatory Visit: Payer: Self-pay | Admitting: Family

## 2017-03-12 ENCOUNTER — Encounter: Payer: Self-pay | Admitting: Physical Therapy

## 2017-03-12 NOTE — Therapy (Signed)
[facility] [address] [place], [place], [address] Phone: [phone]   Fax:  [phone]  Patient Details  Name: Melanie Hendricks MRN: [id number] Date of Birth: Jun 17, 1936 Referring Provider:  No ref. provider found  Encounter Date: 03/12/2017  PHYSICAL THERAPY DISCHARGE SUMMARY  Visits from Start of Care: 19  Current functional level related to goals / functional outcomes: See goals below   Remaining deficits:    Education / Equipment: HEP  Plan: Patient agrees to discharge.  Patient goals were partially met. Patient is being discharged due to not returning since the last visit.  ?????            PT Long Term Goals - 09/28/16 1004      PT LONG TERM GOAL #1   Title Patient to demonstrate decreased fall risk with Gait velocity >0.8 m/s (all LTGs due 09/04/16) = 2.62 ft/sec   New Target date 10-11-16   Baseline 09/14/16: 10/10.97 sec's= 0.91 m/s, 3.0 ft./sec   Status Achieved     PT LONG TERM GOAL #2   Title Patient to be independent with HEP for balance and strength and walking program.   Baseline met 09-03-16   Status Achieved     PT LONG TERM GOAL #3   Title Patient to ambulate indoors without device independent 300' no LOB   Baseline 09/14/16: met in clinic today   Status Achieved     PT LONG TERM GOAL #4   Title Patient to ambulate outdoors with cane and no LOB 500' paved and grassy surfaces.    Baseline met 08-27-16   Status Achieved     PT LONG TERM GOAL #5   Title Independent in updated HEP, progress from red to green theraband for hip strengthening exs. and balance exs as appropriate.  10-11-16   Time 4   Period Weeks   Status On-going     PT LONG TERM GOAL #6   Title Increase DGI by at least 4 points to increase safety with gait.  10-11-16   Baseline 09/14/16: baseline 17/24   Time 4   Period Weeks   Status On-going     PT LONG TERM GOAL #7   Title Amb. with supervision 200' outside on  paved surface without cane .  10-11-16 Achieved 09/28/16   Time 4   Period Weeks   Status Achieved      Rexanne Mano, PT 03/12/2017, 8:21 AM  [facility] [address] [place], [place], [address] Phone: [phone]   Fax:  [phone]

## 2017-05-28 ENCOUNTER — Ambulatory Visit (INDEPENDENT_AMBULATORY_CARE_PROVIDER_SITE_OTHER): Payer: Medicare Other | Admitting: Family

## 2017-05-28 ENCOUNTER — Encounter: Payer: Self-pay | Admitting: Family

## 2017-05-28 ENCOUNTER — Telehealth: Payer: Self-pay

## 2017-05-28 VITALS — BP 128/76 | HR 103 | Temp 97.9°F | Resp 16 | Ht 66.0 in | Wt 192.8 lb

## 2017-05-28 DIAGNOSIS — M810 Age-related osteoporosis without current pathological fracture: Secondary | ICD-10-CM | POA: Diagnosis not present

## 2017-05-28 DIAGNOSIS — I10 Essential (primary) hypertension: Secondary | ICD-10-CM

## 2017-05-28 DIAGNOSIS — E785 Hyperlipidemia, unspecified: Secondary | ICD-10-CM | POA: Diagnosis not present

## 2017-05-28 DIAGNOSIS — R062 Wheezing: Secondary | ICD-10-CM

## 2017-05-28 NOTE — Telephone Encounter (Signed)
Pt would like to know if you would take her on as a patient since Marya Amsler will be leaving.   Let me know and we will have her come in.

## 2017-05-28 NOTE — Progress Notes (Signed)
Subjective:    Patient ID: Melanie Hendricks, female    DOB: [birth year], 81 y.o.   MRN: [id number]  Chief Complaint  Patient presents with  . Follow-up    wants refills on all meds    HPI:  Melanie Hendricks is a 81 y.o. female who  has a past medical history of Allergy; Bronchitis; Fx humeral neck; Hepatitis; Hyperlipidemia; Hypertension; Skin melanoma ([place]); and Stroke ([place]). and presents today for a follow up office visit.  1.) Hypertension - Currently maintained on lisinopril and reports taking the medications as prescribed and denies adverse side effects or hypotensive readings. Blood pressures at home have been stable. Denies changes in vision, worst headache of life or new symptoms of end organ damage. Working on following a low sodium diet.   BP Readings from Last 3 Encounters:  05/28/17 128/76  02/15/17 132/80  12/08/16 132/64     2.) Hyperlipidemia - Currently maintained on atorvastatin with goal LDL level less than 70. No adverse side effects or myalgias. No chest pain, shortness of breath or dyspnea.   Lab Results  Component Value Date   CHOL 162 11/18/2016   HDL 81.90 11/18/2016   LDLCALC 64 11/18/2016   TRIG 80.0 11/18/2016   CHOLHDL 2 11/18/2016    3.) Cough/Wheezing - Previously diagnosed with CAP and treated with a course of levofloxacin and noted to be improving on during hospital follow-up visit. She was also started on Breo to help with cough and wheezing at the time. Continues to experience the associated symptom of wheezing, cough and occasional shortness of breath. Modifying factors include Breo which she reports taking as prescribed and does improve her symptoms. Worsened with physical activity and believes that she is more easily fatigued than previous. No fevers. Course of the symptoms is staying about the same with some gradual worsening at times.    Allergies  Allergen Reactions  . Other Other (See Comments)    Powder from latex gloves, aerosol  sprays, perfumes - causes cough      Outpatient Medications Prior to Visit  Medication Sig Dispense Refill  . aspirin EC 81 MG EC tablet Take 1 tablet (81 mg total) by mouth daily.    Marland Kitchen atorvastatin (LIPITOR) 40 MG tablet TAKE 1 TABLET(40 MG) BY MOUTH DAILY AT 6 PM 90 tablet 1  . fluticasone furoate-vilanterol (BREO ELLIPTA) 100-25 MCG/INH AEPB Inhale 1 puff into the lungs daily. 14 each 0  . lisinopril (PRINIVIL,ZESTRIL) 10 MG tablet TAKE 1 TABLET(10 MG) BY MOUTH DAILY 90 tablet 1   No facility-administered medications prior to visit.       Past Surgical History:  Procedure Laterality Date  . Melenoma removal    . TONSILLECTOMY        Past Medical History:  Diagnosis Date  . Allergy   . Bronchitis   . Fx humeral neck   . Hepatitis   . Hyperlipidemia   . Hypertension   . Skin melanoma ([place])   . Stroke [facility])       Review of Systems  Constitutional: Negative for chills and fever.  HENT: Negative for congestion, postnasal drip, rhinorrhea, sinus pain, sinus pressure, sore throat and trouble swallowing.   Eyes:       Negative for changes in vision  Respiratory: Positive for shortness of breath and wheezing. Negative for cough and chest tightness.   Cardiovascular: Negative for chest pain, palpitations and leg swelling.  Neurological: Negative for dizziness, weakness and light-headedness.  Objective:    BP 128/76 (BP Location: Left Arm, Patient Position: Sitting, Cuff Size: Large)   Pulse (!) 103   Temp 97.9 F (36.6 C) (Oral)   Resp 16   Ht 5\' 6"  (1.676 m)   Wt 192 lb 12.8 oz (87.5 kg)   SpO2 98%   BMI 31.12 kg/m  Nursing note and vital signs reviewed.  Physical Exam  Constitutional: She is oriented to person, place, and time. She appears well-developed and well-nourished. No distress.  HENT:  Right Ear: Hearing, tympanic membrane, external ear and ear canal normal.  Left Ear: Hearing, tympanic membrane, external ear and ear canal normal.  Nose:  Nose normal.  Mouth/Throat: Uvula is midline, oropharynx is clear and moist and mucous membranes are normal.  Cardiovascular: Normal rate, regular rhythm, normal heart sounds and intact distal pulses.  Exam reveals no gallop and no friction rub.   No murmur heard. Pulmonary/Chest: No respiratory distress. She has wheezes. She has no rales. She exhibits no tenderness.  Neurological: She is alert and oriented to person, place, and time.  Skin: Skin is warm and dry.  Psychiatric: She has a normal mood and affect. Her behavior is normal. Judgment and thought content normal.       Assessment & Plan:   Problem List Items Addressed This Visit      Cardiovascular and Mediastinum   Benign essential HTN - Primary    Blood pressure below goal 140/90 with current medication regimen and no adverse side effects. Denies worse headache of life with no new symptoms of end organ damage noted on physical exam. Continue current dosage of lisinopril. Encouraged to monitor blood pressure at home and follow low-sodium diet.        Musculoskeletal and Integument   Age-related osteoporosis without current pathological fracture    Most recent bone mineral density scan shows osteoporosis and currently maintained on calcium supplementation. Discussed potential for Fosamax or Prolia in addition to her current supplementation. She'll research the medications and determine cost effectiveness. Continue weightbearing exercises as tolerated. Recommend calcium intake of 1200 mg per day and vitamin D of 800 international units per day.        Other   Dyslipidemia, goal LDL below 70    Most recent lipid profile with LDL less than 70 ankle with no adverse side effects or myalgias. Continue current dosage of atorvastatin.      Wheezing    Continues to experience wheezing and occasional coughing. Increase Brio. Refer to pulmonology for pulmonary function testing. Question need for modified barium swallow to rule out  aspiration with swallowing although she has no fevers and does not cough when eating on a regular basis. Follow-up if symptoms worsen or do not improve pending referral.      Relevant Orders   Ambulatory referral to Pulmonology       I am having Ms. Kaucher maintain her aspirin, fluticasone furoate-vilanterol, atorvastatin, and lisinopril.   Follow-up: Return in about 4 months (around 09/27/2017), or if symptoms worsen or fail to improve.  Mauricio Po, FNP

## 2017-05-28 NOTE — Assessment & Plan Note (Signed)
Continues to experience wheezing and occasional coughing. Increase Brio. Refer to pulmonology for pulmonary function testing. Question need for modified barium swallow to rule out aspiration with swallowing although she has no fevers and does not cough when eating on a regular basis. Follow-up if symptoms worsen or do not improve pending referral.

## 2017-05-28 NOTE — Patient Instructions (Addendum)
Thank you for choosing [facility].  SUMMARY AND INSTRUCTIONS:  Please continue to take your medications as prescribed.  Continue to monitor your blood pressure at home at least once daily at differing times throughout the day.   Please let us know the name of the mail order pharmacy you'd like [place] to send your medication to.   We will increase your Breo.   A referral to pulmonology has been placed for a breathing test.   Medication:  Your prescription(s) have been submitted to your pharmacy or been printed and provided for you. Please take as directed and contact our office if you believe you are having problem(s) with the medication(s) or have any questions.  Follow up:  If your symptoms worsen or fail to improve, please contact our office for further instruction, or in case of emergency go directly to the emergency room at the closest medical facility.

## 2017-05-28 NOTE — Assessment & Plan Note (Signed)
Most recent lipid profile with LDL less than 70 ankle with no adverse side effects or myalgias. Continue current dosage of atorvastatin.

## 2017-05-28 NOTE — Assessment & Plan Note (Signed)
Most recent bone mineral density scan shows osteoporosis and currently maintained on calcium supplementation. Discussed potential for Fosamax or Prolia in addition to her current supplementation. She'll research the medications and determine cost effectiveness. Continue weightbearing exercises as tolerated. Recommend calcium intake of 1200 mg per day and vitamin D of 800 international units per day.

## 2017-05-28 NOTE — Assessment & Plan Note (Signed)
Blood pressure below goal 140/90 with current medication regimen and no adverse side effects. Denies worse headache of life with no new symptoms of end organ damage noted on physical exam. Continue current dosage of lisinopril. Encouraged to monitor blood pressure at home and follow low-sodium diet.

## 2017-06-02 NOTE — Telephone Encounter (Signed)
yes

## 2017-06-03 NOTE — Telephone Encounter (Signed)
Spoke with pt and scheduled her to see Dr Ronnald Ramp.

## 2017-06-09 ENCOUNTER — Telehealth: Payer: Self-pay | Admitting: Family

## 2017-06-09 MED ORDER — FLUTICASONE FUROATE-VILANTEROL 100-25 MCG/INH IN AEPB: 1.0000 | INHALATION_SPRAY | Freq: Every day | RESPIRATORY_TRACT | 1 refills | Status: DC

## 2017-06-09 NOTE — Telephone Encounter (Signed)
Notified pt [facility] to [email or web address]

## 2017-06-09 NOTE — Telephone Encounter (Signed)
fluticasone furoate-vilanterol (BREO ELLIPTA) 100-25 MCG/INH AEPB   Patient is requesting a refill on this medication. She was giving a sample and would not like a RX. Please advise. Thank you.

## 2017-06-24 ENCOUNTER — Institutional Professional Consult (permissible substitution): Payer: Medicare Other | Admitting: Emergency Medicine

## 2017-07-13 ENCOUNTER — Telehealth: Payer: Self-pay | Admitting: *Deleted

## 2017-07-13 NOTE — Telephone Encounter (Addendum)
Left message with patient about the [facility] research study new Informed consent that I am mailing today. Instructed patient to sign/date and marked areas and mail to research office. This will allow [place] to complete the remaining follow up via phone. Update: received signed consent form for the [facility] research study. Final copy will be mailed to patient for her records.

## 2017-07-21 ENCOUNTER — Ambulatory Visit: Payer: Medicare Other | Admitting: Internal Medicine

## 2017-07-21 ENCOUNTER — Ambulatory Visit (INDEPENDENT_AMBULATORY_CARE_PROVIDER_SITE_OTHER): Payer: Medicare Other | Admitting: Internal Medicine

## 2017-07-21 ENCOUNTER — Encounter: Payer: Self-pay | Admitting: Emergency Medicine

## 2017-07-21 ENCOUNTER — Encounter: Payer: Self-pay | Admitting: Internal Medicine

## 2017-07-21 ENCOUNTER — Ambulatory Visit: Payer: Medicare Other | Admitting: Emergency Medicine

## 2017-07-21 VITALS — BP 126/72 | HR 95 | Ht 66.0 in | Wt 192.0 lb

## 2017-07-21 VITALS — BP 156/78 | HR 95 | Temp 97.6°F | Resp 16 | Ht 66.0 in | Wt 192.0 lb

## 2017-07-21 DIAGNOSIS — R05 Cough: Secondary | ICD-10-CM

## 2017-07-21 DIAGNOSIS — I10 Essential (primary) hypertension: Secondary | ICD-10-CM | POA: Diagnosis not present

## 2017-07-21 DIAGNOSIS — J4520 Mild intermittent asthma, uncomplicated: Secondary | ICD-10-CM | POA: Insufficient documentation

## 2017-07-21 DIAGNOSIS — Z23 Encounter for immunization: Secondary | ICD-10-CM | POA: Diagnosis not present

## 2017-07-21 DIAGNOSIS — R059 Cough, unspecified: Secondary | ICD-10-CM

## 2017-07-21 DIAGNOSIS — R058 Other specified cough: Secondary | ICD-10-CM | POA: Insufficient documentation

## 2017-07-21 DIAGNOSIS — H25013 Cortical age-related cataract, bilateral: Secondary | ICD-10-CM | POA: Diagnosis not present

## 2017-07-21 LAB — POCT EXHALED NITRIC OXIDE: FENO LEVEL (PPB): 28

## 2017-07-21 MED ORDER — LORATADINE 10 MG PO TABS: 10.0000 mg | ORAL_TABLET | Freq: Every day | ORAL | 5 refills | Status: DC

## 2017-07-21 MED ORDER — HYDROCODONE-HOMATROPINE 5-1.5 MG/5ML PO SYRP: 5.0000 mL | ORAL_SOLUTION | Freq: Three times a day (TID) | ORAL | 0 refills | Status: DC | PRN

## 2017-07-21 MED ORDER — FLUTICASONE PROPIONATE 50 MCG/ACT NA SUSP: 2.0000 | Freq: Every day | NASAL | 5 refills | Status: DC

## 2017-07-21 MED ORDER — TELMISARTAN 40 MG PO TABS: 40.0000 mg | ORAL_TABLET | Freq: Every day | ORAL | 1 refills | Status: DC

## 2017-07-21 MED ORDER — ESOMEPRAZOLE MAGNESIUM 40 MG PO CPDR: 40.0000 mg | DELAYED_RELEASE_CAPSULE | Freq: Every day | ORAL | 5 refills | Status: DC

## 2017-07-21 NOTE — Patient Instructions (Signed)
Please start taking Nexium 40 mg once a day.  Take this medication 1 hour before or after eating Please start taking loratadine 10 mg once a day Please start fluticasone nasal spray, 2 sprays each nostril once a day. Do not restart Breo for now Agree with stopping lisinopril.  Continue Micardis as recommended by Dr. Ronnald Ramp We will perform pulmonary function testing at your next office visit Follow with Dr Lamonte Sakai in 1 month with full PFT

## 2017-07-21 NOTE — Progress Notes (Signed)
Subjective:  Patient ID: Melanie Hendricks, female    DOB: 1935/12/30  Age: 81 y.o. MRN: [id number]  CC: Establish Care (Patient is here today to establish care. She states (after reviewing mer med list) "The Breo I use it sporatically I don't use it every day."); Cough; and Hypertension   HPI Melanie Hendricks presents for a chronic cough that has worsened over the last year.  She had a CVA about a year ago and at that time an ACE inhibitor was added to her medical regimen.  She had a chronic cough prior to that which was attributed to asthma.  She uses her ICS/LABA inhaler intermittently.  Her cough is rarely productive of white phlegm.  She denies chest pain, hemoptysis, night sweats, fever, chills, weight loss, shortness of breath, or wheezing.  Outpatient Medications Prior to Visit  Medication Sig Dispense Refill  . aspirin EC 81 MG EC tablet Take 1 tablet (81 mg total) by mouth daily.    Marland Kitchen atorvastatin (LIPITOR) 40 MG tablet TAKE 1 TABLET(40 MG) BY MOUTH DAILY AT 6 PM 90 tablet 1  . fluticasone furoate-vilanterol (BREO ELLIPTA) 100-25 MCG/INH AEPB Inhale 1 puff into the lungs daily. 28 each 1  . lisinopril (PRINIVIL,ZESTRIL) 10 MG tablet TAKE 1 TABLET(10 MG) BY MOUTH DAILY 90 tablet 1   No facility-administered medications prior to visit.     ROS Review of Systems  Constitutional: Negative for appetite change, chills, diaphoresis, fatigue, fever and unexpected weight change.  HENT: Negative.  Negative for sore throat.   Respiratory: Positive for cough. Negative for choking, chest tightness, shortness of breath, wheezing and stridor.   Cardiovascular: Negative.  Negative for chest pain, palpitations and leg swelling.  Gastrointestinal: Negative.  Negative for abdominal pain, constipation, diarrhea, nausea and vomiting.  Endocrine: Negative.   Genitourinary: Negative.  Negative for difficulty urinating.  Musculoskeletal: Negative.   Skin: Negative.  Negative for rash.    Allergic/Immunologic: Negative.   Neurological: Negative.  Negative for dizziness, weakness, numbness and headaches.  Hematological: Negative.  Negative for adenopathy. Does not bruise/bleed easily.  Psychiatric/Behavioral: Negative.     Objective:  BP (!) 156/78 (BP Location: Left Arm, Patient Position: Sitting, Cuff Size: Normal)   Pulse 95   Temp 97.6 F (36.4 C) (Oral)   Resp 16   Ht 5\' 6"  (1.676 m)   Wt 192 lb (87.1 kg)   SpO2 96%   BMI 30.99 kg/m   BP Readings from Last 3 Encounters:  07/21/17 126/72  07/21/17 (!) 156/78  05/28/17 128/76    Wt Readings from Last 3 Encounters:  07/21/17 192 lb (87.1 kg)  07/21/17 192 lb (87.1 kg)  05/28/17 192 lb 12.8 oz (87.5 kg)    Physical Exam  Constitutional: She is oriented to person, place, and time.  Non-toxic appearance. She does not have a sickly appearance. She does not appear ill. No distress.  HENT:  Mouth/Throat: Oropharynx is clear and moist. No oropharyngeal exudate.  Eyes: Conjunctivae are normal. Right eye exhibits no discharge. Left eye exhibits no discharge. No scleral icterus.  Neck: Normal range of motion. Neck supple. No JVD present. No thyromegaly present.  Cardiovascular: Normal rate and regular rhythm. Exam reveals no gallop.  No murmur heard. Pulmonary/Chest: Effort normal and breath sounds normal. No respiratory distress. She has no wheezes. She has no rales. She exhibits no tenderness.  Abdominal: Soft. Bowel sounds are normal. She exhibits no distension and no mass. There is no tenderness. There is no  rebound and no guarding.  Musculoskeletal: Normal range of motion. She exhibits no edema, tenderness or deformity.  Neurological: She is alert and oriented to person, place, and time.  Skin: Skin is warm and dry. No rash noted. She is not diaphoretic. No erythema. No pallor.  Vitals reviewed.   Lab Results  Component Value Date   WBC 4.3 11/18/2016   HGB 13.8 11/18/2016   HCT 40.9 11/18/2016   PLT  285.0 11/18/2016   GLUCOSE 108 (H) 11/18/2016   CHOL 162 11/18/2016   TRIG 80.0 11/18/2016   HDL 81.90 11/18/2016   LDLCALC 64 11/18/2016   ALT 19 11/18/2016   AST 17 11/18/2016   NA 141 11/18/2016   K 4.4 11/18/2016   CL 107 11/18/2016   CREATININE 0.88 11/18/2016   BUN 16 11/18/2016   CO2 27 11/18/2016   INR 1.00 10/23/2016   HGBA1C 5.4 06/24/2016    Dg Bone Density (dxa)  Result Date: 12/03/2016 Date of study: 12/02/2016 Exam: DUAL X-RAY ABSORPTIOMETRY (DXA) FOR BONE MINERAL DENSITY (BMD) Instrument: [employer] [facility] Provider: PCP Indication: Screening for low BMD Comparison: none (please note that it is not possible to compare data from different instruments) Clinical data: Pt is a 81 y.o. female with previous history of humeral fracture. On calcium and vitamin D. Results:  Lumbar spine (L1-L4) Femoral neck (FN) T-score -1.5  RFN: -2.2 LFN: -2.2  Assessment: the BMD is low according to the [facility] classification for osteoporosis (see below). Fracture risk: moderate FRAX score: 10 year major osteoporotic risk: 22.4%. 10 year hip fracture risk: 6.2%. These are above the thresholds for treatment of 20% and 3%, respectively. Comments: the technical quality of the study is good. Evaluation for secondary causes should be considered if clinically indicated. Recommend optimizing calcium (1200 mg/day) and vitamin D (800 IU/day) intake. Followup: Repeat BMD is appropriate after 2 years or after 1-2 years if starting treatment. WHO criteria for diagnosis of osteoporosis in postmenopausal women and in men 53 y/o or older: - normal: T-score -1.0 to + 1.0 - osteopenia/low bone density: T-score between -2.5 and -1.0 - osteoporosis: T-score below -2.5 - severe osteoporosis: T-score below -2.5 with history of fragility fracture Note: although not part of the WHO classification, the presence of a fragility fracture, regardless of the T-score, should be considered diagnostic of osteoporosis, provided  other causes for the fracture have been excluded. Treatment: The National Osteoporosis Foundation recommends that treatment be considered in postmenopausal women and men age 68 or older with: 1. Hip or vertebral (clinical or morphometric) fracture 2. T-score of - 2.5 or lower at the spine or hip 3. 10-year fracture probability by FRAX of at least 20% for a major osteoporotic fracture and 3% for a hip fracture Philemon Kingdom, MD [facility]    Assessment & Plan:   Andreia was seen today for establish care, cough and hypertension.  Diagnoses and all orders for this visit:  Benign essential HTN- I think she has developed a chronic cough from the ACE inhibitor so I have asked her to stop taking it.  Her blood pressure is not adequately well controlled today so we will upgrade to an ARB. -     telmisartan (MICARDIS) 40 MG tablet; Take 1 tablet (40 mg total) daily by mouth.  Need for influenza vaccination -     Flu Vaccine QUAD 6+ mos PF IM (Fluarix Quad PF)  Mild intermittent asthma without complication- Her FeNO is mildly elevated at 23, her chronic  cough may be related to poorly controlled asthma.  I have asked her to be compliant with a daily dose of Breo.  Cough- she has a chronic recurrent cough that I think has been exacerbated recently by the ACE inhibitor.  Will offer her a prescription for Hycodan to control the cough while the effects of the ACE inhibitor ACE inhibitor resolve .  She sees a pulmonologist later today for further evaluation as well. -     HYDROcodone-homatropine (HYCODAN) 5-1.5 MG/5ML syrup; Take 5 mLs every 8 (eight) hours as needed by mouth for cough. -     POCT EXHALED NITRIC OXIDE  Cortical age-related cataract of both eyes -     Ambulatory referral to Ophthalmology  Other orders -     Pneumococcal conjugate vaccine 13-valent   I have discontinued Hillery Jacks. Petit's lisinopril. I am also having her start on telmisartan and HYDROcodone-homatropine.  Additionally, I am having her maintain her aspirin, atorvastatin, and fluticasone furoate-vilanterol.  Meds ordered this encounter  Medications  . telmisartan (MICARDIS) 40 MG tablet    Sig: Take 1 tablet (40 mg total) daily by mouth.    Dispense:  90 tablet    Refill:  1  . HYDROcodone-homatropine (HYCODAN) 5-1.5 MG/5ML syrup    Sig: Take 5 mLs every 8 (eight) hours as needed by mouth for cough.    Dispense:  120 mL    Refill:  0     Follow-up: Return in about 3 months (around 10/21/2017).  Scarlette Calico, MD

## 2017-07-21 NOTE — Assessment & Plan Note (Signed)
Chronic cough, long-standing.  Suspect a component of allergic rhinitis here given her reported history.  She also has some symptoms that are consistent with possible asthma.  She has been treated empirically in the past, not currently using bronchodilators.  She denies any GERD symptoms although this could be a silent contributor given her hiatal hernia.  Importantly she was just taken off lisinopril today!  This is a likely contributor as well.  I need to quantify any degree of airways obstruction.  She will have pulmonary function testing.  In the meantime I will hold the Brio, start empiric GERD and allergic rhinitis therapy to see if she benefits.  Depending on improvement we will determine whether she needs an airway inspection or other imaging.  If she has airflow obstruction we will consider reinitiating bronchodilators.

## 2017-07-21 NOTE — Patient Instructions (Signed)

## 2017-07-21 NOTE — Progress Notes (Signed)
Subjective:    Patient ID: Melanie Hendricks, female    DOB: 12/08/1935, 81 y.o.   MRN: [id number]  HPI 81 year old former smoker (pack years) with a history of hypertension, allergic rhinitis, prior pontine CVA.  She also carries a history of suspected asthma - she was started on Breo about March 2018 empirically. She has only used it sproadically.  She is referred today for evaluation of chronic cough. She describes the cough as longstanding, since 1990 when she had a significant increase in allergies, recalls that it was dry cough. She has had persistent sx since then - often made worse by environmental factors like dusts, chemicals, smoke. She had a FENO test today >> 28. She can sometimes have dyspnea with her cough. She had flu and then URI / bronchitis in early 2018, notes that her cough has more mucous since then. She has hoarseness since that illness. She denies any dysphagia or choking. She has a lot of nasal congestion and drainage currently, clear drainage. She denies any GERD sx. She was on lisinopril from 10/2016 up until today!! She had a CT chest in 10/2016 that shows a small HH, emphysematous change, some suspected air trapping.     Review of Systems  Constitutional: Negative for fever and unexpected weight change.  HENT: Positive for congestion, sinus pressure and sneezing. Negative for dental problem, ear pain, nosebleeds, postnasal drip, rhinorrhea, sore throat and trouble swallowing.   Eyes: Negative for redness and itching.  Respiratory: Positive for cough. Negative for chest tightness, shortness of breath and wheezing.   Cardiovascular: Negative for palpitations and leg swelling.  Gastrointestinal: Negative for nausea and vomiting.  Genitourinary: Negative for dysuria.  Musculoskeletal: Negative for joint swelling.  Skin: Negative for rash.  Allergic/Immunologic: Negative.  Negative for environmental allergies, food allergies and immunocompromised state.  Neurological:  Negative for headaches.  Hematological: Does not bruise/bleed easily.  Psychiatric/Behavioral: Negative for dysphoric mood. The patient is not nervous/anxious.     Past Medical History:  Diagnosis Date  . Allergy   . Bronchitis   . Fx humeral neck   . Hepatitis   . Hyperlipidemia   . Hypertension   . Skin melanoma ([place])   . Stroke [facility])      Family History  Problem Relation Age of Onset  . Stomach cancer Mother   . Thyroid cancer Father   . Heart attack Father   . Seizures Brother   . Heart failure Brother   . Hypertension Maternal Grandmother      Social History   Socioeconomic History  . Marital status: Legally Separated    Spouse name: Not on file  . Number of children: 3  . Years of education: 72  . Highest education level: Not on file  Social Needs  . Financial resource strain: Not on file  . Food insecurity - worry: Not on file  . Food insecurity - inability: Not on file  . Transportation needs - medical: Not on file  . Transportation needs - non-medical: Not on file  Occupational History  . Occupation: Retired  Tobacco Use  . Smoking status: Former Smoker    Packs/day: 0.50    Years: 6.00    Pack years: 3.00    Types: Cigarettes    Last attempt to quit: 1968    Years since quitting: 50.9  . Smokeless tobacco: Never Used  Substance and Sexual Activity  . Alcohol use: No  . Drug use: No  . Sexual activity: Not on  file  Other Topics Concern  . Not on file  Social History Narrative   Fun: Gardening and sewing   Denies abuse and feels safe at home.    Lives w/ her son and daughter-in-law   Right-handed   Caffeine: 1 cup of coffee and 1 Coke per day     Allergies  Allergen Reactions  . Lisinopril Cough  . Other Other (See Comments)    Powder from latex gloves, aerosol sprays, perfumes - causes cough     Outpatient Medications Prior to Visit  Medication Sig Dispense Refill  . aspirin EC 81 MG EC tablet Take 1 tablet (81 mg total) by mouth  daily.    Marland Kitchen atorvastatin (LIPITOR) 40 MG tablet TAKE 1 TABLET(40 MG) BY MOUTH DAILY AT 6 PM 90 tablet 1  . CALCIUM PO Take 1 tablet daily by mouth.    . fluticasone furoate-vilanterol (BREO ELLIPTA) 100-25 MCG/INH AEPB Inhale 1 puff into the lungs daily. 28 each 1  . HYDROcodone-homatropine (HYCODAN) 5-1.5 MG/5ML syrup Take 5 mLs every 8 (eight) hours as needed by mouth for cough. 120 mL 0  . telmisartan (MICARDIS) 40 MG tablet Take 1 tablet (40 mg total) daily by mouth. 90 tablet 1  . vitamin C (ASCORBIC ACID) 500 MG tablet Take 500 mg 2 (two) times daily by mouth.     No facility-administered medications prior to visit.         Objective:   Physical Exam Vitals:   07/21/17 1437  BP: 126/72  Pulse: 95  SpO2: 96%  Weight: 192 lb (87.1 kg)  Height: 5\' 6"  (1.676 m)   Gen: Pleasant, well-nourished, in no distress,  normal affect, occasionally coughing  ENT: No lesions,  mouth clear,  oropharynx clear, no postnasal drip  Neck: No JVD, no TMG, no carotid bruits  Lungs: No use of accessory muscles, clear without rales or rhonchi, coughing and some very mild upper airway noise on a forced expiration  Cardiovascular: RRR, heart sounds normal, no murmur or gallops, no peripheral edema  Musculoskeletal: No deformities, no cyanosis or clubbing  Neuro: alert, non focal  Skin: Warm, no lesions or rash     Assessment & Plan:  Cough Chronic cough, long-standing.  Suspect a component of allergic rhinitis here given her reported history.  She also has some symptoms that are consistent with possible asthma.  She has been treated empirically in the past, not currently using bronchodilators.  She denies any GERD symptoms although this could be a silent contributor given her hiatal hernia.  Importantly she was just taken off lisinopril today!  This is a likely contributor as well.  I need to quantify any degree of airways obstruction.  She will have pulmonary function testing.  In the meantime I  will hold the Brio, start empiric GERD and allergic rhinitis therapy to see if she benefits.  Depending on improvement we will determine whether she needs an airway inspection or other imaging.  If she has airflow obstruction we will consider reinitiating bronchodilators.  Baltazar Apo, MD, PhD 07/21/2017, 3:35 PM [facility] [phone] or if no answer [phone]

## 2017-08-04 ENCOUNTER — Telehealth: Payer: Self-pay | Admitting: *Deleted

## 2017-08-04 NOTE — Telephone Encounter (Addendum)
Left message for patient to call research office for the [facility] research study follow up. 08/09/17 left message for patient to call research office for follow up.

## 2017-08-10 ENCOUNTER — Encounter: Payer: Self-pay | Admitting: *Deleted

## 2017-08-10 DIAGNOSIS — Z006 Encounter for examination for normal comparison and control in clinical research program: Secondary | ICD-10-CM

## 2017-08-10 NOTE — Progress Notes (Signed)
[facility] Research study month 12 follow up visit completed via phone. Patient states she has been doing well. She is back to her usual activities. Her Plavix was discontinued back in April and her lisinopril was changed to Micardis. She rated her health at 75% and denies any pain, Anxiety or depression. She has not had any diagnostic testing since last phone call.  I thanked her for her continued participation and informed her that her next research required visit is due 23/Mar/2019-18/May/2019.

## 2017-08-20 ENCOUNTER — Ambulatory Visit: Payer: Medicare Other | Admitting: Emergency Medicine

## 2017-08-20 ENCOUNTER — Ambulatory Visit (INDEPENDENT_AMBULATORY_CARE_PROVIDER_SITE_OTHER): Payer: Medicare Other | Admitting: Emergency Medicine

## 2017-08-20 ENCOUNTER — Encounter: Payer: Self-pay | Admitting: Emergency Medicine

## 2017-08-20 VITALS — BP 126/74 | HR 100 | Ht 65.5 in | Wt 193.0 lb

## 2017-08-20 DIAGNOSIS — R05 Cough: Secondary | ICD-10-CM

## 2017-08-20 DIAGNOSIS — R490 Dysphonia: Secondary | ICD-10-CM

## 2017-08-20 DIAGNOSIS — R059 Cough, unspecified: Secondary | ICD-10-CM

## 2017-08-20 LAB — PULMONARY FUNCTION TEST
DL/VA % pred: 95 %
DL/VA: 4.77 ml/min/mmHg/L
DLCO COR % PRED: 61 %
DLCO UNC: 15.98 ml/min/mmHg
DLCO cor: 16.29 ml/min/mmHg
DLCO unc % pred: 60 %
FEF 25-75 POST: 1.99 L/s
FEF 25-75 Pre: 1.46 L/sec
FEF2575-%Change-Post: 35 %
FEF2575-%Pred-Post: 128 %
FEF2575-%Pred-Pre: 94 %
FEV1-%Change-Post: 8 %
FEV1-%PRED-POST: 72 %
FEV1-%PRED-PRE: 67 %
FEV1-POST: 1.54 L
FEV1-PRE: 1.42 L
FEV1FVC-%Change-Post: 5 %
FEV1FVC-%Pred-Pre: 109 %
FEV6-%Change-Post: 2 %
FEV6-%PRED-PRE: 65 %
FEV6-%Pred-Post: 66 %
FEV6-POST: 1.79 L
FEV6-Pre: 1.75 L
FEV6FVC-%PRED-POST: 105 %
FEV6FVC-%Pred-Pre: 105 %
FVC-%CHANGE-POST: 2 %
FVC-%PRED-PRE: 61 %
FVC-%Pred-Post: 63 %
FVC-POST: 1.79 L
FVC-PRE: 1.75 L
POST FEV6/FVC RATIO: 100 %
PRE FEV1/FVC RATIO: 81 %
Post FEV1/FVC ratio: 86 %
Pre FEV6/FVC Ratio: 100 %
RV % PRED: 103 %
RV: 2.53 L
TLC % PRED: 89 %
TLC: 4.72 L

## 2017-08-20 NOTE — Patient Instructions (Addendum)
Please continue to take your loratadine and fluticasone nasal spray as you have been using them Stop Nexium for now. If your cough returns then we may need to restart.  We will arrange for you to see Dr. Redmond Baseman with ENT to further evaluate your hoarseness and to inspect your upper airway. Your pulmonary function testing does not show any evidence for significant asthma.  I do not believe we need to restart Breo at this time. Follow with Dr Lamonte Sakai in 3 months or sooner if you have any problems.

## 2017-08-20 NOTE — Progress Notes (Signed)
PFT done today. 

## 2017-08-20 NOTE — Assessment & Plan Note (Signed)
Suspect that she has benefited largely from his continuation of her ACE inhibitor.  Pulmonary function testing today most consistent with restriction on spirometry without any evidence for asthma. She still has some congestion and allergic symptoms despite starting loratadine and fluticasone nasal spray.    I also gave her empiric PPI even in absence of overt GERD symptoms.  At this point we will try to stop the PPI and see if she tolerates.  Continue her allergy regimen.  I will refer her to Dr. Redmond Baseman, whom she has seen before for her hearing aids, to inspect her upper airway given her persistent hoarseness.

## 2017-08-20 NOTE — Progress Notes (Signed)
Subjective:    Patient ID: Melanie Hendricks, female    DOB: 01/10/1936, 81 y.o.   MRN: [id number]  Cough  Pertinent negatives include no ear pain, eye redness, fever, headaches, postnasal drip, rash, rhinorrhea, sore throat, shortness of breath or wheezing. There is no history of environmental allergies.   81 year old former smoker (6 pack years) with a history of hypertension, allergic rhinitis, prior pontine CVA.  She also carries a history of suspected asthma - she was started on Breo about March 2018 empirically. She has only used it sproadically.  She is referred today for evaluation of chronic cough.  She describes the cough as longstanding, since 1990 when she had a significant increase in allergies, recalls that it was dry cough. She has had persistent sx since then - often made worse by environmental factors like dusts, chemicals, smoke. She had a FENO test today >> 28. She can sometimes have dyspnea with her cough. She had flu and then URI / bronchitis in early 2018, notes that her cough has more mucous since then. She has hoarseness since that illness. She denies any dysphagia or choking. She has a lot of nasal congestion and drainage currently, clear drainage. She denies any GERD sx. She was on lisinopril from 10/2016 up until today!! She had a CT chest in 10/2016 that shows a small HH, emphysematous change, some suspected air trapping.   ROV 08/20/17 --this is a follow-up visit for evaluation of cough.  She has some allergic rhinitis that is a probable contributor.  She also had previously been on lisinopril but this was stopped on the same date as our original visit.  We did a trial off the Memory Dance is to see if she would miss it.  She says that she has not.  We started Nexium, loratadine, fluticasone nasal spray and she underwent pulmonary function testing today which I have reviewed.  Spirometry is consistent with a restrictive pattern without a significant bronchodilator response.  Her lung  volumes are normal and her diffusion capacity is decreased but corrects to the normal range when adjusted for alveolar volume. She reports that her cough is improved, but she still has hoarse voice and congestion.      Review of Systems  Constitutional: Negative for fever and unexpected weight change.  HENT: Positive for congestion, sinus pressure and sneezing. Negative for dental problem, ear pain, nosebleeds, postnasal drip, rhinorrhea, sore throat and trouble swallowing.   Eyes: Negative for redness and itching.  Respiratory: Positive for cough. Negative for chest tightness, shortness of breath and wheezing.   Cardiovascular: Negative for palpitations and leg swelling.  Gastrointestinal: Negative for nausea and vomiting.  Genitourinary: Negative for dysuria.  Musculoskeletal: Negative for joint swelling.  Skin: Negative for rash.  Allergic/Immunologic: Negative.  Negative for environmental allergies, food allergies and immunocompromised state.  Neurological: Negative for headaches.  Hematological: Does not bruise/bleed easily.  Psychiatric/Behavioral: Negative for dysphoric mood. The patient is not nervous/anxious.     Past Medical History:  Diagnosis Date  . Allergy   . Bronchitis   . Fx humeral neck   . Hepatitis   . Hyperlipidemia   . Hypertension   . Skin melanoma ([place])   . Stroke [facility])      Family History  Problem Relation Age of Onset  . Stomach cancer Mother   . Thyroid cancer Father   . Heart attack Father   . Seizures Brother   . Heart failure Brother   . Hypertension Maternal Grandmother  Social History   Socioeconomic History  . Marital status: Divorced    Spouse name: Not on file  . Number of children: 3  . Years of education: 56  . Highest education level: Not on file  Social Needs  . Financial resource strain: Not on file  . Food insecurity - worry: Not on file  . Food insecurity - inability: Not on file  . Transportation needs - medical:  Not on file  . Transportation needs - non-medical: Not on file  Occupational History  . Occupation: Retired  Tobacco Use  . Smoking status: Former Smoker    Packs/day: 0.50    Years: 6.00    Pack years: 3.00    Types: Cigarettes    Last attempt to quit: 1968    Years since quitting: 50.9  . Smokeless tobacco: Never Used  Substance and Sexual Activity  . Alcohol use: No  . Drug use: No  . Sexual activity: Not on file  Other Topics Concern  . Not on file  Social History Narrative   Fun: Gardening and sewing   Denies abuse and feels safe at home.    Lives w/ her son and daughter-in-law   Right-handed   Caffeine: 1 cup of coffee and 1 Coke per day     Allergies  Allergen Reactions  . Lisinopril Cough  . Other Other (See Comments)    Powder from latex gloves, aerosol sprays, perfumes - causes cough     Outpatient Medications Prior to Visit  Medication Sig Dispense Refill  . aspirin EC 81 MG EC tablet Take 1 tablet (81 mg total) by mouth daily.    Marland Kitchen atorvastatin (LIPITOR) 40 MG tablet TAKE 1 TABLET(40 MG) BY MOUTH DAILY AT 6 PM 90 tablet 1  . CALCIUM PO Take 1 tablet daily by mouth.    . esomeprazole (NEXIUM) 40 MG capsule Take 1 capsule (40 mg total) daily by mouth. 30 capsule 5  . fluticasone (FLONASE) 50 MCG/ACT nasal spray Place 2 sprays daily into both nostrils. 16 g 5  . HYDROcodone-homatropine (HYCODAN) 5-1.5 MG/5ML syrup Take 5 mLs every 8 (eight) hours as needed by mouth for cough. 120 mL 0  . loratadine (CLARITIN) 10 MG tablet Take 1 tablet (10 mg total) daily by mouth. 30 tablet 5  . telmisartan (MICARDIS) 40 MG tablet Take 1 tablet (40 mg total) daily by mouth. 90 tablet 1  . vitamin C (ASCORBIC ACID) 500 MG tablet Take 500 mg 2 (two) times daily by mouth.    . fluticasone furoate-vilanterol (BREO ELLIPTA) 100-25 MCG/INH AEPB Inhale 1 puff into the lungs daily. (Patient not taking: Reported on 08/20/2017) 28 each 1   No facility-administered medications prior to  visit.         Objective:   Physical Exam Vitals:   08/20/17 1605 08/20/17 1607  BP:  126/74  Pulse:  100  SpO2:  98%  Weight: 193 lb (87.5 kg)   Height: 5' 5.5" (1.664 m)    Gen: Pleasant, well-nourished, in no distress,  normal affect  ENT: No lesions,  mouth clear,  oropharynx clear, no postnasal drip  Neck: No JVD, no stridor  Lungs: No use of accessory muscles, clear without rales or rhonchi, coughs during a deep breath.  No wheezing.  No crackles  Cardiovascular: RRR, heart sounds normal, no murmur or gallops, no peripheral edema  Musculoskeletal: No deformities, no cyanosis or clubbing  Neuro: alert, non focal  Skin: Warm, no lesions or rash  Assessment & Plan:  Cough Suspect that she has benefited largely from his continuation of her ACE inhibitor.  Pulmonary function testing today most consistent with restriction on spirometry without any evidence for asthma. She still has some congestion and allergic symptoms despite starting loratadine and fluticasone nasal spray.    I also gave her empiric PPI even in absence of overt GERD symptoms.  At this point we will try to stop the PPI and see if she tolerates.  Continue her allergy regimen.  I will refer her to Dr. Redmond Baseman, whom she has seen before for her hearing aids, to inspect her upper airway given her persistent hoarseness.  Baltazar Apo, MD, PhD 08/20/2017, 4:27 PM [facility] [phone] or if no answer [phone]

## 2017-09-09 ENCOUNTER — Other Ambulatory Visit: Payer: Self-pay

## 2017-09-09 MED ORDER — ATORVASTATIN CALCIUM 40 MG PO TABS: 40.0000 mg | ORAL_TABLET | Freq: Every day | ORAL | 1 refills | Status: DC

## 2017-10-20 ENCOUNTER — Encounter: Payer: Self-pay | Admitting: Internal Medicine

## 2017-10-21 ENCOUNTER — Ambulatory Visit: Payer: Medicare Other | Admitting: Internal Medicine

## 2017-10-26 ENCOUNTER — Encounter: Payer: Self-pay | Admitting: Internal Medicine

## 2017-10-26 ENCOUNTER — Ambulatory Visit: Payer: Medicare Other | Admitting: Internal Medicine

## 2017-10-26 ENCOUNTER — Other Ambulatory Visit (INDEPENDENT_AMBULATORY_CARE_PROVIDER_SITE_OTHER): Payer: Medicare Other

## 2017-10-26 VITALS — BP 138/86 | HR 91 | Temp 98.0°F | Resp 16 | Ht 65.0 in | Wt 197.0 lb

## 2017-10-26 DIAGNOSIS — I639 Cerebral infarction, unspecified: Secondary | ICD-10-CM

## 2017-10-26 DIAGNOSIS — E785 Hyperlipidemia, unspecified: Secondary | ICD-10-CM

## 2017-10-26 DIAGNOSIS — R05 Cough: Secondary | ICD-10-CM | POA: Diagnosis not present

## 2017-10-26 DIAGNOSIS — R058 Other specified cough: Secondary | ICD-10-CM

## 2017-10-26 DIAGNOSIS — I6350 Cerebral infarction due to unspecified occlusion or stenosis of unspecified cerebral artery: Secondary | ICD-10-CM

## 2017-10-26 DIAGNOSIS — I10 Essential (primary) hypertension: Secondary | ICD-10-CM

## 2017-10-26 LAB — CBC WITH DIFFERENTIAL/PLATELET
BASOS PCT: 0.9 % (ref 0.0–3.0)
Basophils Absolute: 0.1 10*3/uL (ref 0.0–0.1)
EOS ABS: 0.2 10*3/uL (ref 0.0–0.7)
Eosinophils Relative: 3.7 % (ref 0.0–5.0)
HEMATOCRIT: 42.2 % (ref 36.0–46.0)
Hemoglobin: 14.3 g/dL (ref 12.0–15.0)
LYMPHS ABS: 1.7 10*3/uL (ref 0.7–4.0)
LYMPHS PCT: 29.5 % (ref 12.0–46.0)
MCHC: 33.8 g/dL (ref 30.0–36.0)
MCV: 91.8 fl (ref 78.0–100.0)
MONO ABS: 0.5 10*3/uL (ref 0.1–1.0)
Monocytes Relative: 8.8 % (ref 3.0–12.0)
NEUTROS ABS: 3.3 10*3/uL (ref 1.4–7.7)
NEUTROS PCT: 57.1 % (ref 43.0–77.0)
PLATELETS: 307 10*3/uL (ref 150.0–400.0)
RBC: 4.6 Mil/uL (ref 3.87–5.11)
RDW: 12.8 % (ref 11.5–15.5)
WBC: 5.8 10*3/uL (ref 4.0–10.5)

## 2017-10-26 LAB — COMPREHENSIVE METABOLIC PANEL
ALT: 18 U/L (ref 0–35)
AST: 17 U/L (ref 0–37)
Albumin: 4.2 g/dL (ref 3.5–5.2)
Alkaline Phosphatase: 62 U/L (ref 39–117)
BUN: 17 mg/dL (ref 6–23)
CALCIUM: 9.9 mg/dL (ref 8.4–10.5)
CHLORIDE: 106 meq/L (ref 96–112)
CO2: 27 mEq/L (ref 19–32)
CREATININE: 0.95 mg/dL (ref 0.40–1.20)
GFR: 60.29 mL/min (ref >60.00)
GLUCOSE: 109 mg/dL — AB (ref 70–99)
Potassium: 4.2 mEq/L (ref 3.5–5.1)
Sodium: 140 mEq/L (ref 135–145)
Total Bilirubin: 0.7 mg/dL (ref 0.2–1.2)
Total Protein: 7.5 g/dL (ref 6.0–8.3)

## 2017-10-26 LAB — LIPID PANEL
CHOL/HDL RATIO: 2
CHOLESTEROL: 169 mg/dL (ref 0–200)
HDL: 83.6 mg/dL (ref >39.00)
LDL CALC: 68 mg/dL (ref 0–99)
NonHDL: 85.55
Triglycerides: 89 mg/dL (ref 0.0–149.0)
VLDL: 17.8 mg/dL (ref 0.0–40.0)

## 2017-10-26 MED ORDER — ASPIRIN 81 MG PO TBEC: 81.0000 mg | DELAYED_RELEASE_TABLET | Freq: Every day | ORAL | 1 refills | Status: DC

## 2017-10-26 NOTE — Progress Notes (Signed)
Subjective:  Patient ID: Melanie Hendricks, female    DOB: 1935-12-27  Age: 82 y.o. MRN: [id number]  CC: Hyperlipidemia and Hypertension   HPI Melanie Hendricks presents for f/up - She feels well today and offers no new complaints.  She tells me her blood pressure has recently been well controlled.  She has had no recent episodes of headache, blurred vision, chest pain, shortness of breath, palpitations, edema, or fatigue.  She has had a chronic cough and laryngitis for many years.  She recently saw ENT and they told her to use a steroid nasal spray.  She thinks that stopping the ACE inhibitor has helped her cough some but it has not completely resolved.  She was told to stop using the LABA/ICS inhaler because it did not help.  Outpatient Medications Prior to Visit  Medication Sig Dispense Refill  . atorvastatin (LIPITOR) 40 MG tablet Take 1 tablet (40 mg total) by mouth daily at 6 PM. 90 tablet 1  . CALCIUM PO Take 1 tablet daily by mouth.    . fluticasone (FLONASE) 50 MCG/ACT nasal spray Place 2 sprays daily into both nostrils. 16 g 5  . loratadine (CLARITIN) 10 MG tablet Take 1 tablet (10 mg total) daily by mouth. 30 tablet 5  . telmisartan (MICARDIS) 40 MG tablet Take 1 tablet (40 mg total) daily by mouth. 90 tablet 1  . vitamin C (ASCORBIC ACID) 500 MG tablet Take 500 mg 2 (two) times daily by mouth.    Marland Kitchen aspirin EC 81 MG EC tablet Take 1 tablet (81 mg total) by mouth daily.    Marland Kitchen esomeprazole (NEXIUM) 40 MG capsule Take 1 capsule (40 mg total) daily by mouth. 30 capsule 5  . fluticasone furoate-vilanterol (BREO ELLIPTA) 100-25 MCG/INH AEPB Inhale 1 puff into the lungs daily. (Patient not taking: Reported on 08/20/2017) 28 each 1  . HYDROcodone-homatropine (HYCODAN) 5-1.5 MG/5ML syrup Take 5 mLs every 8 (eight) hours as needed by mouth for cough. 120 mL 0   No facility-administered medications prior to visit.     ROS Review of Systems  Constitutional: Negative for appetite change,  diaphoresis and fatigue.  HENT: Positive for postnasal drip and voice change. Negative for trouble swallowing.   Eyes: Negative for visual disturbance.  Respiratory: Positive for cough. Negative for chest tightness, shortness of breath and wheezing.   Cardiovascular: Negative for chest pain, palpitations and leg swelling.  Gastrointestinal: Negative for abdominal pain, constipation, diarrhea, nausea and vomiting.  Endocrine: Negative.   Genitourinary: Negative.   Musculoskeletal: Negative.  Negative for arthralgias, back pain, myalgias and neck pain.  Skin: Negative.  Negative for color change and rash.  Allergic/Immunologic: Negative.   Neurological: Negative.  Negative for dizziness, weakness and light-headedness.  Hematological: Negative for adenopathy. Does not bruise/bleed easily.  Psychiatric/Behavioral: Negative.     Objective:  BP 138/86 (BP Location: Right Arm, Patient Position: Sitting, Cuff Size: Large)   Pulse 91   Temp 98 F (36.7 C) (Oral)   Resp 16   Ht 5\' 5"  (1.651 m)   Wt 197 lb 0.6 oz (89.4 kg)   SpO2 96%   BMI 32.79 kg/m   BP Readings from Last 3 Encounters:  10/26/17 138/86  08/20/17 126/74  07/21/17 126/72    Wt Readings from Last 3 Encounters:  10/26/17 197 lb 0.6 oz (89.4 kg)  08/20/17 193 lb (87.5 kg)  07/21/17 192 lb (87.1 kg)    Physical Exam  Constitutional: She is oriented to person,  place, and time. No distress.  HENT:  Mouth/Throat: Oropharynx is clear and moist. No oropharyngeal exudate.  Eyes: Conjunctivae are normal. Left eye exhibits no discharge. No scleral icterus.  Neck: Normal range of motion. Neck supple. No JVD present. No thyromegaly present.  Cardiovascular: Normal rate, regular rhythm and normal heart sounds. Exam reveals no gallop.  No murmur heard. Pulmonary/Chest: Effort normal and breath sounds normal. No respiratory distress. She has no wheezes. She has no rales.  Abdominal: Soft. Bowel sounds are normal. She exhibits no  distension and no mass. There is no tenderness.  Musculoskeletal: Normal range of motion. She exhibits no edema, tenderness or deformity.  Lymphadenopathy:    She has no cervical adenopathy.  Neurological: She is alert and oriented to person, place, and time.  Skin: Skin is warm and dry. No rash noted. She is not diaphoretic. No erythema. No pallor.  Vitals reviewed.   Lab Results  Component Value Date   WBC 5.8 10/26/2017   HGB 14.3 10/26/2017   HCT 42.2 10/26/2017   PLT 307.0 10/26/2017   GLUCOSE 109 (H) 10/26/2017   CHOL 169 10/26/2017   TRIG 89.0 10/26/2017   HDL 83.60 10/26/2017   LDLCALC 68 10/26/2017   ALT 18 10/26/2017   AST 17 10/26/2017   NA 140 10/26/2017   K 4.2 10/26/2017   CL 106 10/26/2017   CREATININE 0.95 10/26/2017   BUN 17 10/26/2017   CO2 27 10/26/2017   TSH 1.48 10/26/2017   INR 1.00 10/23/2016   HGBA1C 5.4 06/24/2016    Dg Bone Density (dxa)  Result Date: 12/03/2016 Date of study: 12/02/2016 Exam: DUAL X-RAY ABSORPTIOMETRY (DXA) FOR BONE MINERAL DENSITY (BMD) Instrument: [employer] [facility] Provider: PCP Indication: Screening for low BMD Comparison: none (please note that it is not possible to compare data from different instruments) Clinical data: Pt is a 82 y.o. female with previous history of humeral fracture. On calcium and vitamin D. Results:  Lumbar spine (L1-L4) Femoral neck (FN) T-score -1.5  RFN: -2.2 LFN: -2.2  Assessment: the BMD is low according to the [facility] classification for osteoporosis (see below). Fracture risk: moderate FRAX score: 10 year major osteoporotic risk: 22.4%. 10 year hip fracture risk: 6.2%. These are above the thresholds for treatment of 20% and 3%, respectively. Comments: the technical quality of the study is good. Evaluation for secondary causes should be considered if clinically indicated. Recommend optimizing calcium (1200 mg/day) and vitamin D (800 IU/day) intake. Followup: Repeat BMD is appropriate after 2 years or  after 1-2 years if starting treatment. WHO criteria for diagnosis of osteoporosis in postmenopausal women and in men 75 y/o or older: - normal: T-score -1.0 to + 1.0 - osteopenia/low bone density: T-score between -2.5 and -1.0 - osteoporosis: T-score below -2.5 - severe osteoporosis: T-score below -2.5 with history of fragility fracture Note: although not part of the WHO classification, the presence of a fragility fracture, regardless of the T-score, should be considered diagnostic of osteoporosis, provided other causes for the fracture have been excluded. Treatment: The National Osteoporosis Foundation recommends that treatment be considered in postmenopausal women and men age 3 or older with: 1. Hip or vertebral (clinical or morphometric) fracture 2. T-score of - 2.5 or lower at the spine or hip 3. 10-year fracture probability by FRAX of at least 20% for a major osteoporotic fracture and 3% for a hip fracture Philemon Kingdom, MD [facility]    Assessment & Plan:   Melanie Hendricks was seen today for  hyperlipidemia and hypertension.  Diagnoses and all orders for this visit:  Dyslipidemia, goal LDL below 70- She has achieved her LDL goal is doing well on the statin. -     Lipid panel; Future -     Thyroid Panel With TSH; Future  Upper airway cough syndrome- She was reassured about the benign nature of this.  She does not request anything to treat the symptoms.  Benign essential HTN- Her blood pressure is adequately well controlled.  Electrolytes and renal function are normal. -     Comprehensive metabolic panel; Future -     CBC with Differential/Platelet; Future  Left pontine cerebrovascular accident [facility])- Will continue risk factor modification with aspirin, statin, and blood pressure control. -     aspirin 81 MG EC tablet; Take 1 tablet (81 mg total) by mouth daily.   I have discontinued Deaysia Grigoryan. Labra's fluticasone furoate-vilanterol and HYDROcodone-homatropine. I have also changed  her aspirin. Additionally, I am having her maintain her telmisartan, CALCIUM PO, vitamin C, esomeprazole, loratadine, fluticasone, and atorvastatin.  Meds ordered this encounter  Medications  . aspirin 81 MG EC tablet    Sig: Take 1 tablet (81 mg total) by mouth daily.    Dispense:  90 tablet    Refill:  1     Follow-up: Return in about 6 months (around 04/25/2018).  Scarlette Calico, MD

## 2017-10-26 NOTE — Patient Instructions (Signed)

## 2017-10-27 ENCOUNTER — Encounter: Payer: Self-pay | Admitting: Internal Medicine

## 2017-10-27 LAB — THYROID PANEL WITH TSH
Free Thyroxine Index: 2.7 (ref 1.4–3.8)
T3 Uptake: 27 % (ref 22–35)
T4, Total: 9.9 ug/dL (ref 5.1–11.9)
TSH: 1.48 m[IU]/L (ref 0.40–4.50)

## 2017-11-22 ENCOUNTER — Ambulatory Visit: Payer: Medicare Other | Admitting: Emergency Medicine

## 2017-11-22 ENCOUNTER — Encounter: Payer: Self-pay | Admitting: Emergency Medicine

## 2017-11-22 DIAGNOSIS — R058 Other specified cough: Secondary | ICD-10-CM

## 2017-11-22 DIAGNOSIS — R9389 Abnormal findings on diagnostic imaging of other specified body structures: Secondary | ICD-10-CM | POA: Insufficient documentation

## 2017-11-22 DIAGNOSIS — R05 Cough: Secondary | ICD-10-CM | POA: Diagnosis not present

## 2017-11-22 NOTE — Assessment & Plan Note (Signed)
Hoarseness, chronic cough.  The cough is somewhat improved with treatment of her allergic rhinitis.  We talked about possibly improving even further with nasal saline washes.  She will consider this.  Her hoarseness is for the most part unchanged.  Her ENT evaluation showed some change in her right vocal cord, smaller than the left.  Unclear whether any intervention is needed here.  She has not missed her GERD therapy.  She does have a hiatal hernia.

## 2017-11-22 NOTE — Progress Notes (Signed)
Subjective:    Patient ID: Melanie Hendricks, female    DOB: May 02, 1936, 82 y.o.   MRN: [id number]  Cough  Pertinent negatives include no ear pain, eye redness, fever, headaches, postnasal drip, rash, rhinorrhea, sore throat, shortness of breath or wheezing. There is no history of environmental allergies.   82 year old former smoker (6 pack years) with a history of hypertension, allergic rhinitis, prior pontine CVA.  She also carries a history of suspected asthma - she was started on Breo about March 2018 empirically. She has only used it sproadically.  She is referred today for evaluation of chronic cough.  She describes the cough as longstanding, since 1990 when she had a significant increase in allergies, recalls that it was dry cough. She has had persistent sx since then - often made worse by environmental factors like dusts, chemicals, smoke. She had a FENO test today >> 28. She can sometimes have dyspnea with her cough. She had flu and then URI / bronchitis in early 2018, notes that her cough has more mucous since then. She has hoarseness since that illness. She denies any dysphagia or choking. She has a lot of nasal congestion and drainage currently, clear drainage. She denies any GERD sx. She was on lisinopril from 10/2016 up until today!! She had a CT chest in 10/2016 that shows a small HH, emphysematous change, some suspected air trapping.   ROV 08/20/17 --this is a follow-up visit for evaluation of cough.  She has some allergic rhinitis that is a probable contributor.  She also had previously been on lisinopril but this was stopped on the same date as our original visit.  We did a trial off the Memory Dance is to see if she would miss it.  She says that she has not.  We started Nexium, loratadine, fluticasone nasal spray and she underwent pulmonary function testing today which I have reviewed.  Spirometry is consistent with a restrictive pattern without a significant bronchodilator response.  Her lung  volumes are normal and her diffusion capacity is decreased but corrects to the normal range when adjusted for alveolar volume. She reports that her cough is improved, but she still has hoarse voice and congestion.   ROV 11/22/17 --pleasant 82 year old woman with a history of allergic rhinitis, chronic cough, hoarseness.  She has restriction on pulmonary function testing but no evidence for obstruction.  She used to be on an ACE inhibitor which was stopped.  Her cough got a bit better when we treated allergies, empirically treated GERD. We stopped her GERD meds last time - she has tolerated. She saw Dr Redmond Baseman, was noted to have some hypotrophic changes of the R VC, no other lesions. She still has some cough, sometimes feels a choking sensation on saliva. Coughs daily, sometimes w clear mucous. She is on flonase and loratadine. She has some SOB w exertion. She had a CT scan chest 10/23/16 -    Review of Systems  Constitutional: Negative for fever and unexpected weight change.  HENT: Positive for congestion, sinus pressure and sneezing. Negative for dental problem, ear pain, nosebleeds, postnasal drip, rhinorrhea, sore throat and trouble swallowing.   Eyes: Negative for redness and itching.  Respiratory: Positive for cough. Negative for chest tightness, shortness of breath and wheezing.   Cardiovascular: Negative for palpitations and leg swelling.  Gastrointestinal: Negative for nausea and vomiting.  Genitourinary: Negative for dysuria.  Musculoskeletal: Negative for joint swelling.  Skin: Negative for rash.  Allergic/Immunologic: Negative.  Negative for environmental allergies,  food allergies and immunocompromised state.  Neurological: Negative for headaches.  Hematological: Does not bruise/bleed easily.  Psychiatric/Behavioral: Negative for dysphoric mood. The patient is not nervous/anxious.     Past Medical History:  Diagnosis Date  . Allergy   . Bronchitis   . Fx humeral neck   . Hepatitis     . Hyperlipidemia   . Hypertension   . Skin melanoma ([place])   . Stroke [facility])      Family History  Problem Relation Age of Onset  . Stomach cancer Mother   . Thyroid cancer Father   . Heart attack Father   . Seizures Brother   . Heart failure Brother   . Hypertension Maternal Grandmother      Social History   Socioeconomic History  . Marital status: Divorced    Spouse name: Not on file  . Number of children: 3  . Years of education: 38  . Highest education level: Not on file  Social Needs  . Financial resource strain: Not on file  . Food insecurity - worry: Not on file  . Food insecurity - inability: Not on file  . Transportation needs - medical: Not on file  . Transportation needs - non-medical: Not on file  Occupational History  . Occupation: Retired  Tobacco Use  . Smoking status: Former Smoker    Packs/day: 0.50    Years: 6.00    Pack years: 3.00    Types: Cigarettes    Last attempt to quit: 1968    Years since quitting: 51.2  . Smokeless tobacco: Never Used  Substance and Sexual Activity  . Alcohol use: No  . Drug use: No  . Sexual activity: Not on file  Other Topics Concern  . Not on file  Social History Narrative   Fun: Gardening and sewing   Denies abuse and feels safe at home.    Lives w/ her son and daughter-in-law   Right-handed   Caffeine: 1 cup of coffee and 1 Coke per day     Allergies  Allergen Reactions  . Lisinopril Cough  . Other Other (See Comments)    Powder from latex gloves, aerosol sprays, perfumes - causes cough     Outpatient Medications Prior to Visit  Medication Sig Dispense Refill  . aspirin 81 MG EC tablet Take 1 tablet (81 mg total) by mouth daily. 90 tablet 1  . atorvastatin (LIPITOR) 40 MG tablet Take 1 tablet (40 mg total) by mouth daily at 6 PM. 90 tablet 1  . CALCIUM PO Take 1 tablet daily by mouth.    . fluticasone (FLONASE) 50 MCG/ACT nasal spray Place 2 sprays daily into both nostrils. 16 g 5  . loratadine  (CLARITIN) 10 MG tablet Take 1 tablet (10 mg total) daily by mouth. 30 tablet 5  . telmisartan (MICARDIS) 40 MG tablet Take 1 tablet (40 mg total) daily by mouth. 90 tablet 1  . vitamin C (ASCORBIC ACID) 500 MG tablet Take 500 mg 2 (two) times daily by mouth.    . esomeprazole (NEXIUM) 40 MG capsule Take 1 capsule (40 mg total) daily by mouth. 30 capsule 5   No facility-administered medications prior to visit.         Objective:   Physical Exam Vitals:   11/22/17 0909  BP: (!) 152/98  Pulse: 94  SpO2: 93%  Weight: 198 lb (89.8 kg)  Height: 5' 6.5" (1.689 m)   Gen: Pleasant, well-nourished, in no distress,  normal affect  ENT:  No lesions,  mouth clear,  oropharynx clear, no postnasal drip  Neck: No JVD, no stridor  Lungs: No use of accessory muscles, clear without rales or rhonchi, she has some mild basilar exp wheeze, clear superiorly.   Cardiovascular: RRR, heart sounds normal, no murmur or gallops, no peripheral edema  Musculoskeletal: No deformities, no cyanosis or clubbing  Neuro: alert, non focal  Skin: Warm, no lesions or rash     Assessment & Plan:  Abnormal CT of the chest She had some groundglass infiltrate on CT scan of the chest from 10/2016.  Unclear etiology, pneumonitis versus air trapping, etc.  She has wheezing at the bases on exam today, question whether the infiltrates are still an active issue.  I will consider repeating her CT scan of the chest in 1 year, sooner if she clinically changes.  Her pulmonary function testing did not support obstructive lung disease.  Upper airway cough syndrome Hoarseness, chronic cough.  The cough is somewhat improved with treatment of her allergic rhinitis.  We talked about possibly improving even further with nasal saline washes.  She will consider this.  Her hoarseness is for the most part unchanged.  Her ENT evaluation showed some change in her right vocal cord, smaller than the left.  Unclear whether any intervention is  needed here.  She has not missed her GERD therapy.  She does have a hiatal hernia.  Baltazar Apo, MD, PhD 11/22/2017, 9:41 AM [facility] [phone] or if no answer [phone]

## 2017-11-22 NOTE — Assessment & Plan Note (Signed)
She had some groundglass infiltrate on CT scan of the chest from 10/2016.  Unclear etiology, pneumonitis versus air trapping, etc.  She has wheezing at the bases on exam today, question whether the infiltrates are still an active issue.  I will consider repeating her CT scan of the chest in 1 year, sooner if she clinically changes.  Her pulmonary function testing did not support obstructive lung disease.

## 2017-11-22 NOTE — Patient Instructions (Signed)
Continue loratadine and fluticasone nasal spray as you have been using them. You might want to consider trying nasal saline rinses (Neti-Pot) once a day to see if this decreases your mucus, decreases your throat irritation and hoarseness. We will not start any inhaled medications at this time. Please follow-up with Dr Lamonte Sakai in 1 year or sooner if your breathing or cough give you any problem

## 2017-12-07 ENCOUNTER — Encounter: Payer: Self-pay | Admitting: *Deleted

## 2017-12-07 DIAGNOSIS — Z006 Encounter for examination for normal comparison and control in clinical research program: Secondary | ICD-10-CM

## 2017-12-07 NOTE — Progress Notes (Signed)
STROKE-AF Research study 18 month telephone follow up completed. Patient denies any adverse events reported, no changes in medication and no Stroke symptoms. Next research required telephone visit is due between 20/sep/2019-15/nov/2019. I thanked her for her participation.

## 2018-01-18 ENCOUNTER — Other Ambulatory Visit: Payer: Self-pay | Admitting: Internal Medicine

## 2018-01-18 DIAGNOSIS — I10 Essential (primary) hypertension: Secondary | ICD-10-CM

## 2018-01-19 NOTE — Telephone Encounter (Signed)
Copied from [place] [phone]. Topic: Quick Communication - Rx Refill/Question >> Jan 19, 2018  4:41 PM Arletha Grippe wrote: Medication: telmisartan (MICARDIS) 40 MG tablet Has the patient contacted their pharmacy? Yes.   (Agent: If no, request that the patient contact the pharmacy for the refill.) Preferred Pharmacy (with phone number or street name): [facility] [address]  Agent: Please be advised that RX refills may take up to 3 business days. We ask that you follow-up with your pharmacy.

## 2018-03-08 ENCOUNTER — Other Ambulatory Visit: Payer: Self-pay | Admitting: Internal Medicine

## 2018-03-08 DIAGNOSIS — I10 Essential (primary) hypertension: Secondary | ICD-10-CM

## 2018-03-09 ENCOUNTER — Other Ambulatory Visit: Payer: Self-pay | Admitting: Internal Medicine

## 2018-04-19 ENCOUNTER — Other Ambulatory Visit: Payer: Self-pay | Admitting: Internal Medicine

## 2018-04-19 DIAGNOSIS — I10 Essential (primary) hypertension: Secondary | ICD-10-CM

## 2018-04-25 ENCOUNTER — Ambulatory Visit (INDEPENDENT_AMBULATORY_CARE_PROVIDER_SITE_OTHER): Payer: Medicare Other | Admitting: Internal Medicine

## 2018-04-25 ENCOUNTER — Encounter: Payer: Self-pay | Admitting: Internal Medicine

## 2018-04-25 VITALS — BP 142/86 | HR 96 | Temp 98.6°F | Resp 16 | Ht 66.5 in | Wt 199.5 lb

## 2018-04-25 DIAGNOSIS — J411 Mucopurulent chronic bronchitis: Secondary | ICD-10-CM | POA: Diagnosis not present

## 2018-04-25 DIAGNOSIS — R053 Chronic cough: Secondary | ICD-10-CM | POA: Insufficient documentation

## 2018-04-25 DIAGNOSIS — I10 Essential (primary) hypertension: Secondary | ICD-10-CM

## 2018-04-25 MED ORDER — DEXTROMETHORPHAN POLISTIREX ER 30 MG/5ML PO SUER: 30.0000 mg | ORAL | 5 refills | Status: DC | PRN

## 2018-04-25 MED ORDER — GLYCOPYRROLATE 25 MCG/ML IN SOLN: 1.0000 | Freq: Two times a day (BID) | RESPIRATORY_TRACT | 11 refills | Status: DC

## 2018-04-25 NOTE — Progress Notes (Signed)
Subjective:  Patient ID: Melanie Hendricks, female    DOB: [birth year]  Age: 82 y.o. MRN: [id number]  CC: Hypertension and Cough   HPI Melanie Hendricks presents for f/up - She complains that her cough has worsened a little over the last few months.  It is occasionally productive of clear phlegm.  She also has chronic, unchanged shortness of breath but she does not notice wheezing.  She denies chest pain, hemoptysis, diaphoresis, fever, or chills.  She is not using anything to control the symptoms.  Outpatient Medications Prior to Visit  Medication Sig Dispense Refill  . aspirin 81 MG EC tablet Take 1 tablet (81 mg total) by mouth daily. 90 tablet 1  . atorvastatin (LIPITOR) 40 MG tablet TAKE 1 TABLET(40 MG) BY MOUTH DAILY AT 6 PM 90 tablet 0  . CALCIUM PO Take 1 tablet daily by mouth.    . clopidogrel (PLAVIX) 75 MG tablet Take by mouth.    . fluticasone (FLONASE) 50 MCG/ACT nasal spray Place 2 sprays daily into both nostrils. 16 g 5  . loratadine (CLARITIN) 10 MG tablet Take 1 tablet (10 mg total) daily by mouth. 30 tablet 5  . telmisartan (MICARDIS) 40 MG tablet TAKE 1 TABLET(40 MG) BY MOUTH DAILY 90 tablet 0  . telmisartan (MICARDIS) 40 MG tablet TAKE 1 TABLET(40 MG) BY MOUTH DAILY 90 tablet 0  . vitamin C (ASCORBIC ACID) 500 MG tablet Take 500 mg 2 (two) times daily by mouth.     No facility-administered medications prior to visit.     ROS Review of Systems  Constitutional: Negative.  Negative for appetite change, chills, diaphoresis, fatigue and fever.  HENT: Negative.  Negative for sore throat and trouble swallowing.   Eyes: Negative for visual disturbance.  Respiratory: Positive for cough and shortness of breath. Negative for chest tightness and wheezing.   Cardiovascular: Negative.  Negative for chest pain, palpitations and leg swelling.  Gastrointestinal: Negative for abdominal pain, constipation, diarrhea, nausea and vomiting.  Endocrine: Negative.   Genitourinary:  Negative.  Negative for difficulty urinating and dysuria.  Musculoskeletal: Negative.  Negative for arthralgias and myalgias.  Skin: Negative.  Negative for color change and pallor.  Neurological: Negative.  Negative for dizziness, weakness and light-headedness.  Hematological: Negative for adenopathy. Does not bruise/bleed easily.  Psychiatric/Behavioral: Negative.     Objective:  BP (!) 142/86 (BP Location: Left Arm, Patient Position: Sitting, Cuff Size: Large)   Pulse 96   Temp 98.6 F (37 C) (Oral)   Resp 16   Ht 5' 6.5" (1.689 m)   Wt 199 lb 8 oz (90.5 kg)   SpO2 95%   BMI 31.72 kg/m   BP Readings from Last 3 Encounters:  04/25/18 (!) 142/86  11/22/17 (!) 152/98  10/26/17 138/86    Wt Readings from Last 3 Encounters:  04/25/18 199 lb 8 oz (90.5 kg)  11/22/17 198 lb (89.8 kg)  10/26/17 197 lb 0.6 oz (89.4 kg)    Physical Exam  Constitutional: She is oriented to person, place, and time. No distress.  HENT:  Mouth/Throat: Oropharynx is clear and moist. No oropharyngeal exudate.  Eyes: Conjunctivae are normal. No scleral icterus.  Neck: Normal range of motion. Neck supple. No JVD present. No thyromegaly present.  Cardiovascular: Normal rate, regular rhythm and normal heart sounds. Exam reveals no gallop.  No murmur heard. Pulmonary/Chest: Effort normal. No accessory muscle usage. No tachypnea. No respiratory distress. She has no decreased breath sounds. She has no wheezes.  She has rhonchi in the left lower field. She has rales in the right lower field and the left lower field. She exhibits no tenderness.  Abdominal: Soft. Normal appearance and bowel sounds are normal. She exhibits no mass. There is no hepatosplenomegaly. There is no tenderness.  Musculoskeletal: Normal range of motion. She exhibits no edema, tenderness or deformity.  Lymphadenopathy:    She has no cervical adenopathy.  Neurological: She is alert and oriented to person, place, and time.  Skin: Skin is  warm and dry. She is not diaphoretic. No pallor.  Vitals reviewed.   Lab Results  Component Value Date   WBC 5.8 10/26/2017   HGB 14.3 10/26/2017   HCT 42.2 10/26/2017   PLT 307.0 10/26/2017   GLUCOSE 109 (H) 10/26/2017   CHOL 169 10/26/2017   TRIG 89.0 10/26/2017   HDL 83.60 10/26/2017   LDLCALC 68 10/26/2017   ALT 18 10/26/2017   AST 17 10/26/2017   NA 140 10/26/2017   K 4.2 10/26/2017   CL 106 10/26/2017   CREATININE 0.95 10/26/2017   BUN 17 10/26/2017   CO2 27 10/26/2017   TSH 1.48 10/26/2017   INR 1.00 10/23/2016   HGBA1C 5.4 06/24/2016    Dg Bone Density (dxa)  Result Date: 12/03/2016 Date of study: 12/02/2016 Exam: DUAL X-RAY ABSORPTIOMETRY (DXA) FOR BONE MINERAL DENSITY (BMD) Instrument: [employer] Provider: PCP Indication: Screening for low BMD Comparison: none (please note that it is not possible to compare data from different instruments) Clinical data: Pt is a 82 y.o. female with previous history of humeral fracture. On calcium and vitamin D. Results:  Lumbar spine (L1-L4) Femoral neck (FN) T-score -1.5  RFN: -2.2 LFN: -2.2  Assessment: the BMD is low according to the [facility] classification for osteoporosis (see below). Fracture risk: moderate FRAX score: 10 year major osteoporotic risk: 22.4%. 10 year hip fracture risk: 6.2%. These are above the thresholds for treatment of 20% and 3%, respectively. Comments: the technical quality of the study is good. Evaluation for secondary causes should be considered if clinically indicated. Recommend optimizing calcium (1200 mg/day) and vitamin D (800 IU/day) intake. Followup: Repeat BMD is appropriate after 2 years or after 1-2 years if starting treatment. WHO criteria for diagnosis of osteoporosis in postmenopausal women and in men 52 y/o or older: - normal: T-score -1.0 to + 1.0 - osteopenia/low bone density: T-score between -2.5 and -1.0 - osteoporosis: T-score below -2.5 - severe osteoporosis: T-score below -2.5  with history of fragility fracture Note: although not part of the WHO classification, the presence of a fragility fracture, regardless of the T-score, should be considered diagnostic of osteoporosis, provided other causes for the fracture have been excluded. Treatment: The National Osteoporosis Foundation recommends that treatment be considered in postmenopausal women and men age 60 or older with: 1. Hip or vertebral (clinical or morphometric) fracture 2. T-score of - 2.5 or lower at the spine or hip 3. 10-year fracture probability by FRAX of at least 20% for a major osteoporotic fracture and 3% for a hip fracture Philemon Kingdom, MD [name]    Assessment & Plan:   Brownie was seen today for hypertension and cough.  Diagnoses and all orders for this visit:  Benign essential HTN- Her BP is well controlled  Chronic bronchitis, mucopurulent ([place])- I think she would benefit from using a nebulized LAMA as well as dextromethorphan to treat the symptoms. -     Glycopyrrolate (LONHALA MAGNAIR REFILL KIT) 25 MCG/ML SOLN;  Inhale 1 Act into the lungs 2 (two) times daily. -     Glycopyrrolate (LONHALA MAGNAIR STARTER KIT) 25 MCG/ML SOLN; Inhale 1 Act into the lungs 2 (two) times daily. -     dextromethorphan (DELSYM) 30 MG/5ML liquid; Take 5 mLs (30 mg total) by mouth as needed for cough.   I am having Dazja Houchin. Mcshea start on Glycopyrrolate, Glycopyrrolate, and dextromethorphan. I am also having her maintain her CALCIUM PO, vitamin C, loratadine, fluticasone, aspirin, telmisartan, atorvastatin, telmisartan, and clopidogrel.  Meds ordered this encounter  Medications  . Glycopyrrolate (LONHALA MAGNAIR REFILL KIT) 25 MCG/ML SOLN    Sig: Inhale 1 Act into the lungs 2 (two) times daily.    Dispense:  60 mL    Refill:  11  . Glycopyrrolate (LONHALA MAGNAIR STARTER KIT) 25 MCG/ML SOLN    Sig: Inhale 1 Act into the lungs 2 (two) times daily.    Dispense:  60 mL    Refill:  11  .  dextromethorphan (DELSYM) 30 MG/5ML liquid    Sig: Take 5 mLs (30 mg total) by mouth as needed for cough.    Dispense:  89 mL    Refill:  5     Follow-up: Return in about 6 months (around 10/26/2018).  Scarlette Calico, MD

## 2018-04-25 NOTE — Patient Instructions (Signed)

## 2018-05-24 ENCOUNTER — Telehealth: Payer: Self-pay

## 2018-05-24 NOTE — Telephone Encounter (Signed)
Melanie Hendricks was denied but [employer] stated that they may be able to get Yupelri covered.   Are you okay with changing? Please advise.

## 2018-05-25 ENCOUNTER — Other Ambulatory Visit: Payer: Self-pay | Admitting: Internal Medicine

## 2018-05-25 DIAGNOSIS — J411 Mucopurulent chronic bronchitis: Secondary | ICD-10-CM

## 2018-05-25 MED ORDER — REVEFENACIN 175 MCG/3ML IN SOLN: 1.0000 | Freq: Every day | RESPIRATORY_TRACT | 5 refills | Status: DC

## 2018-05-27 NOTE — Telephone Encounter (Signed)
PCP sent in Melanie Hendricks

## 2018-06-06 ENCOUNTER — Other Ambulatory Visit: Payer: Self-pay | Admitting: Internal Medicine

## 2018-06-15 ENCOUNTER — Telehealth: Payer: Self-pay | Admitting: *Deleted

## 2018-06-15 NOTE — Telephone Encounter (Signed)
Left message on patient voice mail to call research office to complete STROKE~AF month 24 telephone follow up.

## 2018-07-01 ENCOUNTER — Telehealth: Payer: Self-pay | Admitting: *Deleted

## 2018-07-01 NOTE — Telephone Encounter (Signed)
STROKE~AF research: left message for patient to call research office for follow up.

## 2018-07-07 ENCOUNTER — Encounter: Payer: Self-pay | Admitting: *Deleted

## 2018-07-07 DIAGNOSIS — Z006 Encounter for examination for normal comparison and control in clinical research program: Secondary | ICD-10-CM

## 2018-07-07 NOTE — Research (Signed)
STROKE~AF Month 24 telephone follow up. Patient is in [place] visiting her daughter. She denies any changes in her medication or adverse events. Next research required visit will be a phone call between 23/Mar/2020---18/may/2020.

## 2018-07-19 ENCOUNTER — Other Ambulatory Visit: Payer: Self-pay | Admitting: Internal Medicine

## 2018-07-19 DIAGNOSIS — I10 Essential (primary) hypertension: Secondary | ICD-10-CM

## 2018-07-19 MED ORDER — TELMISARTAN 40 MG PO TABS
ORAL_TABLET | ORAL | 0 refills | Status: DC
Start: 2018-07-19 — End: 2018-10-26

## 2018-07-19 NOTE — Telephone Encounter (Signed)
Requested Prescriptions  Pending Prescriptions Disp Refills  . telmisartan (MICARDIS) 40 MG tablet 90 tablet 0    Sig: TAKE 1 TABLET(40 MG) BY MOUTH DAILY     Cardiovascular:  Angiotensin Receptor Blockers Failed - 07/19/2018  2:41 PM      Failed - Cr in normal range and within 180 days    Creatinine, Ser  Date Value Ref Range Status  10/26/2017 0.95 0.40 - 1.20 mg/dL Final         Failed - K in normal range and within 180 days    Potassium  Date Value Ref Range Status  10/26/2017 4.2 3.5 - 5.1 mEq/L Final         Failed - Last BP in normal range    BP Readings from Last 1 Encounters:  04/25/18 (!) 142/86         Passed - Patient is not pregnant      Passed - Valid encounter within last 6 months    Recent Outpatient Visits          2 months ago Benign essential HTN   [place], MD   8 months ago Dyslipidemia, goal LDL below [place], MD   12 months ago Benign essential HTN   [place], Thomas L, MD   1 year ago Benign essential HTN   [place], FNP   1 year ago Medicare annual wellness visit, subsequent   [place], FNP

## 2018-07-19 NOTE — Telephone Encounter (Signed)
Copied from [place] [phone]. Topic: Quick Communication - Rx Refill/Question >> Jul 19, 2018  2:33 PM Waylan Rocher, [place] L wrote: Medication: telmisartan (MICARDIS) 40 MG tablet (OUT OF TOWN)  Has the patient contacted their pharmacy? Yes.   (Agent: If no, request that the patient contact the pharmacy for the refill.) (Agent: If yes, when and what did the pharmacy advise?)  Preferred Pharmacy (with phone number or street name): [facility] [place], [place] [address] Phone: [phone] Fax: [phone]  Agent: Please be advised that RX refills may take up to 3 business days. We ask that you follow-up with your pharmacy.

## 2018-10-21 ENCOUNTER — Telehealth: Payer: Self-pay | Admitting: Internal Medicine

## 2018-10-21 NOTE — Telephone Encounter (Signed)
Copied from [place] [id number]. Topic: Quick Communication - Rx Refill/Question >> Oct 21, 2018 11:40 AM Judyann Munson wrote: Medication: atorvastatin (LIPITOR) 40 MG tablet  Has the patient contacted their pharmacy? Yes   Preferred Pharmacy (with phone number or street name): [facility] [place], [place] [phone] (Phone) [phone] (Fax)    Agent: Please be advised that RX refills may take up to 3 business days. We ask that you follow-up with your pharmacy.

## 2018-10-21 NOTE — Telephone Encounter (Signed)
Pt is due for an OV

## 2018-10-24 NOTE — Telephone Encounter (Signed)
Left message for patient to call back to schedule.  °

## 2018-10-26 ENCOUNTER — Other Ambulatory Visit: Payer: Self-pay | Admitting: Internal Medicine

## 2018-10-26 DIAGNOSIS — I10 Essential (primary) hypertension: Secondary | ICD-10-CM

## 2018-10-26 MED ORDER — TELMISARTAN 40 MG PO TABS: ORAL_TABLET | ORAL | 0 refills | Status: DC

## 2018-10-26 NOTE — Telephone Encounter (Signed)
Patient has appointment scheduled 11/15/18. Courtesy refill given Requested Prescriptions  Pending Prescriptions Disp Refills  . telmisartan (MICARDIS) 40 MG tablet 30 tablet 0    Sig: TAKE 1 TABLET(40 MG) BY MOUTH DAILY     Cardiovascular:  Angiotensin Receptor Blockers Failed - 10/26/2018  2:34 PM      Failed - Cr in normal range and within 180 days    Creatinine, Ser  Date Value Ref Range Status  10/26/2017 0.95 0.40 - 1.20 mg/dL Final         Failed - K in normal range and within 180 days    Potassium  Date Value Ref Range Status  10/26/2017 4.2 3.5 - 5.1 mEq/L Final         Failed - Last BP in normal range    BP Readings from Last 1 Encounters:  04/25/18 (!) 142/86         Failed - Valid encounter within last 6 months    Recent Outpatient Visits          6 months ago Benign essential HTN   [place], Thomas L, MD   1 year ago Dyslipidemia, goal LDL below [place], Thomas L, MD   1 year ago Benign essential HTN   [facility] -[facility]el, MD   1 year ago Benign essential HTN   [place], Gregory D, FNP   1 year ago Medicare annual wellness visit, subsequent   [place], FNP      Future Appointments            In 2 weeks Ronnald Ramp Arvid Right, MD [place], [place] - Patient is not pregnant

## 2018-10-26 NOTE — Telephone Encounter (Signed)
Copied from [place] [id number]. Topic: Quick Communication - Rx Refill/Question >> Oct 26, 2018  2:23 PM Keene Breath wrote: Medication: telmisartan (MICARDIS) 40 MG tablet  Patient called to request a refill for the above medication.  Preferred Pharmacy (with phone number or street name): [id number] [address], [place] [phone] (Phone) [phone] (Fax)

## 2018-11-15 ENCOUNTER — Encounter: Payer: Self-pay | Admitting: Internal Medicine

## 2018-11-15 ENCOUNTER — Ambulatory Visit (INDEPENDENT_AMBULATORY_CARE_PROVIDER_SITE_OTHER): Payer: Medicare Other | Admitting: Internal Medicine

## 2018-11-15 ENCOUNTER — Other Ambulatory Visit (INDEPENDENT_AMBULATORY_CARE_PROVIDER_SITE_OTHER): Payer: Medicare Other

## 2018-11-15 VITALS — BP 160/70 | HR 64 | Temp 97.6°F | Ht 66.5 in | Wt 195.0 lb

## 2018-11-15 DIAGNOSIS — I10 Essential (primary) hypertension: Secondary | ICD-10-CM

## 2018-11-15 DIAGNOSIS — Z23 Encounter for immunization: Secondary | ICD-10-CM | POA: Diagnosis not present

## 2018-11-15 DIAGNOSIS — R739 Hyperglycemia, unspecified: Secondary | ICD-10-CM | POA: Diagnosis not present

## 2018-11-15 DIAGNOSIS — N183 Chronic kidney disease, stage 3 unspecified: Secondary | ICD-10-CM

## 2018-11-15 DIAGNOSIS — E785 Hyperlipidemia, unspecified: Secondary | ICD-10-CM | POA: Diagnosis not present

## 2018-11-15 DIAGNOSIS — Z0000 Encounter for general adult medical examination without abnormal findings: Secondary | ICD-10-CM

## 2018-11-15 DIAGNOSIS — Z0001 Encounter for general adult medical examination with abnormal findings: Secondary | ICD-10-CM

## 2018-11-15 LAB — HEMOGLOBIN A1C: HEMOGLOBIN A1C: 5.9 % (ref 4.6–6.5)

## 2018-11-15 LAB — CBC WITH DIFFERENTIAL/PLATELET
Basophils Absolute: 0.1 10*3/uL (ref 0.0–0.1)
Basophils Relative: 0.9 % (ref 0.0–3.0)
Eosinophils Absolute: 0.2 10*3/uL (ref 0.0–0.7)
Eosinophils Relative: 2.9 % (ref 0.0–5.0)
HCT: 39.9 % (ref 36.0–46.0)
Hemoglobin: 13.6 g/dL (ref 12.0–15.0)
LYMPHS PCT: 30.1 % (ref 12.0–46.0)
Lymphs Abs: 1.9 10*3/uL (ref 0.7–4.0)
MCHC: 34 g/dL (ref 30.0–36.0)
MCV: 90.8 fl (ref 78.0–100.0)
Monocytes Absolute: 0.6 10*3/uL (ref 0.1–1.0)
Monocytes Relative: 9.8 % (ref 3.0–12.0)
Neutro Abs: 3.6 10*3/uL (ref 1.4–7.7)
Neutrophils Relative %: 56.3 % (ref 43.0–77.0)
Platelets: 293 10*3/uL (ref 150.0–400.0)
RBC: 4.4 Mil/uL (ref 3.87–5.11)
RDW: 13.1 % (ref 11.5–15.5)
WBC: 6.4 10*3/uL (ref 4.0–10.5)

## 2018-11-15 LAB — LIPID PANEL
Cholesterol: 157 mg/dL (ref 0–200)
HDL: 85 mg/dL (ref >39.00)
LDL Cholesterol: 59 mg/dL (ref 0–99)
NonHDL: 71.5
Total CHOL/HDL Ratio: 2
Triglycerides: 65 mg/dL (ref 0.0–149.0)
VLDL: 13 mg/dL (ref 0.0–40.0)

## 2018-11-15 LAB — COMPREHENSIVE METABOLIC PANEL
ALT: 17 U/L (ref 0–35)
AST: 17 U/L (ref 0–37)
Albumin: 4.3 g/dL (ref 3.5–5.2)
Alkaline Phosphatase: 74 U/L (ref 39–117)
BUN: 22 mg/dL (ref 6–23)
CO2: 26 mEq/L (ref 19–32)
Calcium: 10.1 mg/dL (ref 8.4–10.5)
Chloride: 106 mEq/L (ref 96–112)
Creatinine, Ser: 1.01 mg/dL (ref 0.40–1.20)
GFR: 52.71 mL/min — ABNORMAL LOW (ref >60.00)
GLUCOSE: 103 mg/dL — AB (ref 70–99)
Potassium: 4.6 mEq/L (ref 3.5–5.1)
Sodium: 139 mEq/L (ref 135–145)
Total Bilirubin: 0.8 mg/dL (ref 0.2–1.2)
Total Protein: 7.6 g/dL (ref 6.0–8.3)

## 2018-11-15 LAB — TSH: TSH: 1.23 u[IU]/mL (ref 0.35–4.50)

## 2018-11-15 MED ORDER — TELMISARTAN 40 MG PO TABS: ORAL_TABLET | ORAL | 1 refills | Status: DC

## 2018-11-15 MED ORDER — ATORVASTATIN CALCIUM 40 MG PO TABS: 40.0000 mg | ORAL_TABLET | Freq: Every day | ORAL | 1 refills | Status: DC

## 2018-11-15 NOTE — Progress Notes (Signed)
Subjective:  Patient ID: Melanie Hendricks, female    DOB: 28-May-1936  Age: 83 y.o. MRN: [id number]  CC: Annual Exam; Hyperlipidemia; and Hypertension   HPI PATRICIA FARGO presents for a CPX.  She returns for a yearly follow-up.  She has no new or different symptoms.  She continues to complain of chronic cough productive of clear phlegm,occasional choking on bread crumbs, and intermittent gasping.  She does not want to evaluate either 1 of these any further.  She has been trying to lose weight.  She also complains of constipation but says she gets adequate symptom relief with stool softeners and MiraLAX.  She has no new neurological complaints.  She tells me her blood pressure has been well controlled.   Past Medical History:  Diagnosis Date  . Allergy   . Bronchitis   . Fx humeral neck   . Hepatitis   . Hyperlipidemia   . Hypertension   . Skin melanoma ([place])   . Stroke [facility])    Past Surgical History:  Procedure Laterality Date  . Melenoma removal    . TONSILLECTOMY      reports that she quit smoking about 52 years ago. Her smoking use included cigarettes. She has a 3.00 pack-year smoking history. She has never used smokeless tobacco. She reports that she does not drink alcohol or use drugs. family history includes Heart attack in her father; Heart failure in her brother; Hypertension in her maternal grandmother; Seizures in her brother; Stomach cancer in her mother; Thyroid cancer in her father. Allergies  Allergen Reactions  . Lisinopril Cough  . Other Other (See Comments)    Powder from latex gloves, aerosol sprays, perfumes - causes cough    Outpatient Medications Prior to Visit  Medication Sig Dispense Refill  . atorvastatin (LIPITOR) 40 MG tablet Take 1 tablet (40 mg total) by mouth daily at 6 PM. 90 tablet 1  . CALCIUM PO Take 1 tablet daily by mouth.    . loratadine (CLARITIN) 10 MG tablet Take 1 tablet (10 mg total) daily by mouth. 30 tablet 5  . Revefenacin  (YUPELRI) 175 MCG/3ML SOLN Inhale 1 Act into the lungs daily. 30 vial 5  . telmisartan (MICARDIS) 40 MG tablet TAKE 1 TABLET(40 MG) BY MOUTH DAILY 30 tablet 0  . vitamin C (ASCORBIC ACID) 500 MG tablet Take 500 mg 2 (two) times daily by mouth.    . clopidogrel (PLAVIX) 75 MG tablet Take by mouth.    . dextromethorphan (DELSYM) 30 MG/5ML liquid Take 5 mLs (30 mg total) by mouth as needed for cough. 89 mL 5  . fluticasone (FLONASE) 50 MCG/ACT nasal spray Place 2 sprays daily into both nostrils. 16 g 5   No facility-administered medications prior to visit.     ROS Review of Systems  Constitutional: Negative for appetite change, diaphoresis, fatigue and unexpected weight change.  HENT: Positive for trouble swallowing. Negative for voice change.   Respiratory: Positive for cough and choking. Negative for chest tightness, shortness of breath and wheezing.   Cardiovascular: Negative for chest pain, palpitations and leg swelling.  Gastrointestinal: Positive for constipation. Negative for abdominal pain, blood in stool, diarrhea, nausea and vomiting.  Endocrine: Negative.   Genitourinary: Negative.  Negative for difficulty urinating.  Musculoskeletal: Positive for gait problem. Negative for arthralgias, back pain, myalgias and neck pain.       She uses a cane  Skin: Negative.  Negative for color change, pallor and rash.  Neurological: Positive for  weakness. Negative for dizziness, light-headedness and numbness.  Hematological: Negative for adenopathy. Does not bruise/bleed easily.  Psychiatric/Behavioral: Negative.     Objective:  BP (!) 160/70 (BP Location: Left Arm, Patient Position: Sitting, Cuff Size: Normal)   Pulse 64   Temp 97.6 F (36.4 C) (Oral)   Ht 5' 6.5" (1.689 m)   Wt 195 lb (88.5 kg)   SpO2 97%   BMI 31.00 kg/m   BP Readings from Last 3 Encounters:  11/15/18 (!) 160/70  04/25/18 (!) 142/86  11/22/17 (!) 152/98    Wt Readings from Last 3 Encounters:  11/15/18 195 lb  (88.5 kg)  04/25/18 199 lb 8 oz (90.5 kg)  11/22/17 198 lb (89.8 kg)    Physical Exam Vitals signs reviewed.  Constitutional:      Appearance: She is obese. She is not ill-appearing or diaphoretic.  HENT:     Nose: Nose normal. No congestion or rhinorrhea.     Mouth/Throat:     Mouth: Mucous membranes are moist.     Pharynx: Oropharynx is clear. No oropharyngeal exudate or posterior oropharyngeal erythema.  Eyes:     General: No scleral icterus.    Conjunctiva/sclera: Conjunctivae normal.  Neck:     Musculoskeletal: Normal range of motion and neck supple. No muscular tenderness.  Cardiovascular:     Rate and Rhythm: Normal rate and regular rhythm.     Pulses: Normal pulses.     Heart sounds: No murmur. No gallop.   Pulmonary:     Effort: Pulmonary effort is normal.     Breath sounds: No stridor. No wheezing, rhonchi or rales.  Abdominal:     General: Bowel sounds are normal.     Palpations: There is no hepatomegaly, splenomegaly or mass.     Tenderness: There is no abdominal tenderness. There is no guarding.  Genitourinary:    Comments: Breast, GU, rectal exams were deferred at her request. Musculoskeletal: Normal range of motion.        General: No swelling.     Right lower leg: No edema.     Left lower leg: No edema.  Skin:    General: Skin is warm and dry.  Neurological:     Mental Status: She is oriented to person, place, and time. Mental status is at baseline.     Coordination: Coordination abnormal.     Gait: Gait abnormal.  Psychiatric:        Mood and Affect: Mood normal.        Behavior: Behavior normal.     Lab Results  Component Value Date   WBC 6.4 11/15/2018   HGB 13.6 11/15/2018   HCT 39.9 11/15/2018   PLT 293.0 11/15/2018   GLUCOSE 103 (H) 11/15/2018   CHOL 157 11/15/2018   TRIG 65.0 11/15/2018   HDL 85.00 11/15/2018   LDLCALC 59 11/15/2018   ALT 17 11/15/2018   AST 17 11/15/2018   NA 139 11/15/2018   K 4.6 11/15/2018   CL 106 11/15/2018     CREATININE 1.01 11/15/2018   BUN 22 11/15/2018   CO2 26 11/15/2018   TSH 1.23 11/15/2018   INR 1.00 10/23/2016   HGBA1C 5.9 11/15/2018    Dg Bone Density (dxa)  Result Date: 12/03/2016 Date of study: 12/02/2016 Exam: DUAL X-RAY ABSORPTIOMETRY (DXA) FOR BONE MINERAL DENSITY (BMD) Instrument: [employer] Provider: PCP Indication: Screening for low BMD Comparison: none (please note that it is not possible to compare data from different instruments) Clinical  data: Pt is a 83 y.o. female with previous history of humeral fracture. On calcium and vitamin D. Results:  Lumbar spine (L1-L4) Femoral neck (FN) T-score -1.5  RFN: -2.2 LFN: -2.2  Assessment: the BMD is low according to the [facility] classification for osteoporosis (see below). Fracture risk: moderate FRAX score: 10 year major osteoporotic risk: 22.4%. 10 year hip fracture risk: 6.2%. These are above the thresholds for treatment of 20% and 3%, respectively. Comments: the technical quality of the study is good. Evaluation for secondary causes should be considered if clinically indicated. Recommend optimizing calcium (1200 mg/day) and vitamin D (800 IU/day) intake. Followup: Repeat BMD is appropriate after 2 years or after 1-2 years if starting treatment. WHO criteria for diagnosis of osteoporosis in postmenopausal women and in men 58 y/o or older: - normal: T-score -1.0 to + 1.0 - osteopenia/low bone density: T-score between -2.5 and -1.0 - osteoporosis: T-score below -2.5 - severe osteoporosis: T-score below -2.5 with history of fragility fracture Note: although not part of the WHO classification, the presence of a fragility fracture, regardless of the T-score, should be considered diagnostic of osteoporosis, provided other causes for the fracture have been excluded. Treatment: The National Osteoporosis Foundation recommends that treatment be considered in postmenopausal women and men age 80 or older with: 1. Hip or vertebral (clinical or  morphometric) fracture 2. T-score of - 2.5 or lower at the spine or hip 3. 10-year fracture probability by FRAX of at least 20% for a major osteoporotic fracture and 3% for a hip fracture Philemon Kingdom, MD [facility]    Assessment & Plan:   Gl[place]ine was seen today for annual exam, hyperlipidemia and hypertension.  Diagnoses and all orders for this visit:  Benign essential HTN- Her blood pressure is not adequately well controlled but she believes this is a whitecoat phenomenon and therefore is not willing to change or add an antihypertensive at this time.  Her labs are negative for secondary causes or endorgan damage.  Will continue the ARB at the current dose. -     CBC with Differential/Platelet; Future -     Comprehensive metabolic panel; Future -     TSH; Future -     telmisartan (MICARDIS) 40 MG tablet; TAKE 1 TABLET(40 MG) BY MOUTH DAILY  Dyslipidemia, goal LDL below 70- She has achieved her LDL goal and is doing well on the statin. -     Lipid panel; Future -     Comprehensive metabolic panel; Future -     TSH; Future -     atorvastatin (LIPITOR) 40 MG tablet; Take 1 tablet (40 mg total) by mouth daily at 6 PM.  Hyperglycemia-her A1c is at 5.9%.  Adequate therapy is not indicated. -     Comprehensive metabolic panel; Future -     Hemoglobin A1c; Future  Need for influenza vaccination -     Flu vaccine HIGH DOSE PF (Fluzone High dose)  Need for pneumococcal vaccination -     Pneumococcal polysaccharide vaccine 23-valent greater than or equal to 2yo subcutaneous/IM  CKD (chronic kidney disease) stage 3, GFR 30-59 ml/min (HCC)- She will avoid nephrotoxic agents and will continue to maintain good control of her blood pressure.   I have discontinued Roxana Lai. Adderley's CALCIUM PO, vitamin C, loratadine, fluticasone, clopidogrel, dextromethorphan, and Revefenacin. I am also having her maintain her telmisartan and atorvastatin.  Meds ordered this encounter    Medications  . telmisartan (MICARDIS) 40 MG tablet    Sig:  TAKE 1 TABLET(40 MG) BY MOUTH DAILY    Dispense:  90 tablet    Refill:  1  . atorvastatin (LIPITOR) 40 MG tablet    Sig: Take 1 tablet (40 mg total) by mouth daily at 6 PM.    Dispense:  90 tablet    Refill:  1   See AVS for instructions about healthy living and anticipatory guidance.  Follow-up: Return in about 6 months (around 05/18/2019).  Scarlette Calico, MD

## 2018-11-15 NOTE — Patient Instructions (Signed)

## 2018-11-16 NOTE — Assessment & Plan Note (Signed)

## 2018-12-22 ENCOUNTER — Telehealth: Payer: Self-pay | Admitting: *Deleted

## 2018-12-22 NOTE — Telephone Encounter (Signed)
Left message for patient to call research office for month 30 follow up phone call for the [facility] research study

## 2018-12-22 NOTE — Telephone Encounter (Signed)
Update: Patient returned call she denies any adverse events , changes in medication. She has 1 remaining research required phone follow up between 27-May-2019-----22-Jul-2019 I thanked her continued participation in the study.

## 2019-05-14 ENCOUNTER — Other Ambulatory Visit: Payer: Self-pay | Admitting: Internal Medicine

## 2019-05-14 DIAGNOSIS — I10 Essential (primary) hypertension: Secondary | ICD-10-CM

## 2019-05-14 DIAGNOSIS — E785 Hyperlipidemia, unspecified: Secondary | ICD-10-CM

## 2019-05-30 ENCOUNTER — Encounter: Payer: Self-pay | Admitting: *Deleted

## 2019-05-30 DIAGNOSIS — Z006 Encounter for examination for normal comparison and control in clinical research program: Secondary | ICD-10-CM

## 2019-05-30 NOTE — Research (Signed)
STROKE~AF Research study final follow up telephone call (36 month) was completed patient denies any SAE or medication changes. She states she has not been in the hospital denies any stroke like symptoms or any diagnostic work (EKG, LABS) since last follow up call. I thanked her for her participation

## 2019-11-12 ENCOUNTER — Ambulatory Visit: Payer: Medicare Other | Attending: Internal Medicine

## 2019-11-12 DIAGNOSIS — Z23 Encounter for immunization: Secondary | ICD-10-CM

## 2019-11-12 NOTE — Progress Notes (Signed)
   Covid-19 Vaccination Clinic  Name:  SARIBEL DEWAR    MRN: [id number] DOB: 1936/04/13  11/12/2019  Ms. Hoctor was observed post Covid-19 immunization for 15 minutes without incident. She was provided with Vaccine Information Sheet and instruction to access the V-Safe system.   Ms. Sticker was instructed to call 911 with any severe reactions post vaccine: Marland Kitchen Difficulty breathing  . Swelling of face and throat  . A fast heartbeat  . A bad rash all over body  . Dizziness and weakness   Immunizations Administered    Name Date Dose VIS Date Route   Pfizer COVID-19 Vaccine 11/12/2019  9:37 AM 0.3 mL 08/18/2019 Intramuscular   Manufacturer: [place]   Lot: [id number]   [place]: [id number]

## 2019-11-23 ENCOUNTER — Telehealth: Payer: Self-pay | Admitting: Internal Medicine

## 2019-11-23 DIAGNOSIS — I10 Essential (primary) hypertension: Secondary | ICD-10-CM

## 2019-11-23 DIAGNOSIS — E785 Hyperlipidemia, unspecified: Secondary | ICD-10-CM

## 2019-11-23 NOTE — Telephone Encounter (Signed)
New message:   1.Medication Requested: atorvastatin (LIPITOR) 40 MG tablet telmisartan (MICARDIS) 40 MG tablet 2. Pharmacy (Name, [address], [place]): [id number] - [place], [address] [address]. On Med List: Yes  4. Last Visit with PCP: 11/15/18  5. Next visit date with PCP:12/20/19   Agent: Please be advised that RX refills may take up to 3 business days. We ask that you follow-up with your pharmacy.

## 2019-11-24 MED ORDER — ATORVASTATIN CALCIUM 40 MG PO TABS: 40.0000 mg | ORAL_TABLET | Freq: Every day | ORAL | 0 refills | Status: DC

## 2019-11-24 MED ORDER — TELMISARTAN 40 MG PO TABS: ORAL_TABLET | ORAL | 0 refills | Status: DC

## 2019-11-24 NOTE — Telephone Encounter (Signed)
30 day ONLY sent in. Pt is scheduled to see PCP in April.   Appt must be kept to receive any additional refills.

## 2019-12-12 ENCOUNTER — Ambulatory Visit: Payer: Medicare Other | Attending: Internal Medicine

## 2019-12-12 DIAGNOSIS — Z23 Encounter for immunization: Secondary | ICD-10-CM

## 2019-12-12 NOTE — Progress Notes (Signed)
   Covid-19 Vaccination Clinic  Name:  KERIE HOCKLEY    MRN: [id number] DOB: December 14, 1935  12/12/2019  Ms. Procter was observed post Covid-19 immunization for 15 minutes without incident. She was provided with Vaccine Information Sheet and instruction to access the V-Safe system.   Ms. Virgilio was instructed to call 911 with any severe reactions post vaccine: Marland Kitchen Difficulty breathing  . Swelling of face and throat  . A fast heartbeat  . A bad rash all over body  . Dizziness and weakness   Immunizations Administered    Name Date Dose VIS Date Route   Pfizer COVID-19 Vaccine 12/12/2019 10:52 AM 0.3 mL 08/18/2019 Intramuscular   Manufacturer: [place]   Lot: [id number]   [place]: [id number]

## 2019-12-19 ENCOUNTER — Other Ambulatory Visit: Payer: Self-pay | Admitting: Internal Medicine

## 2019-12-19 DIAGNOSIS — I10 Essential (primary) hypertension: Secondary | ICD-10-CM

## 2019-12-19 DIAGNOSIS — E785 Hyperlipidemia, unspecified: Secondary | ICD-10-CM

## 2019-12-20 ENCOUNTER — Other Ambulatory Visit: Payer: Self-pay

## 2019-12-20 ENCOUNTER — Ambulatory Visit (INDEPENDENT_AMBULATORY_CARE_PROVIDER_SITE_OTHER): Payer: Medicare Other | Admitting: Internal Medicine

## 2019-12-20 ENCOUNTER — Ambulatory Visit (INDEPENDENT_AMBULATORY_CARE_PROVIDER_SITE_OTHER): Payer: Medicare Other

## 2019-12-20 ENCOUNTER — Encounter: Payer: Self-pay | Admitting: Internal Medicine

## 2019-12-20 VITALS — BP 110/60 | HR 125 | Temp 98.3°F | Ht 66.0 in | Wt 172.5 lb

## 2019-12-20 DIAGNOSIS — R05 Cough: Secondary | ICD-10-CM

## 2019-12-20 DIAGNOSIS — R059 Cough, unspecified: Secondary | ICD-10-CM

## 2019-12-20 DIAGNOSIS — R000 Tachycardia, unspecified: Secondary | ICD-10-CM

## 2019-12-20 DIAGNOSIS — I10 Essential (primary) hypertension: Secondary | ICD-10-CM

## 2019-12-20 DIAGNOSIS — E785 Hyperlipidemia, unspecified: Secondary | ICD-10-CM

## 2019-12-20 DIAGNOSIS — N1831 Chronic kidney disease, stage 3a: Secondary | ICD-10-CM | POA: Diagnosis not present

## 2019-12-20 DIAGNOSIS — R1319 Other dysphagia: Secondary | ICD-10-CM | POA: Insufficient documentation

## 2019-12-20 DIAGNOSIS — Z0001 Encounter for general adult medical examination with abnormal findings: Secondary | ICD-10-CM

## 2019-12-20 DIAGNOSIS — K2100 Gastro-esophageal reflux disease with esophagitis, without bleeding: Secondary | ICD-10-CM

## 2019-12-20 DIAGNOSIS — J411 Mucopurulent chronic bronchitis: Secondary | ICD-10-CM

## 2019-12-20 DIAGNOSIS — R1310 Dysphagia, unspecified: Secondary | ICD-10-CM | POA: Diagnosis not present

## 2019-12-20 LAB — LIPID PANEL
Cholesterol: 163 mg/dL (ref 0–200)
HDL: 74.2 mg/dL (ref >39.00)
LDL Cholesterol: 72 mg/dL (ref 0–99)
NonHDL: 88.67
Total CHOL/HDL Ratio: 2
Triglycerides: 85 mg/dL (ref 0.0–149.0)
VLDL: 17 mg/dL (ref 0.0–40.0)

## 2019-12-20 LAB — URINALYSIS, ROUTINE W REFLEX MICROSCOPIC
Nitrite: NEGATIVE
Specific Gravity, Urine: >=1.03 — AB (ref 1.000–1.030)
Total Protein, Urine: 30 — AB
Urine Glucose: NEGATIVE
Urobilinogen, UA: 1 (ref 0.0–1.0)
pH: 5 (ref 5.0–8.0)

## 2019-12-20 LAB — CBC WITH DIFFERENTIAL/PLATELET
Basophils Absolute: 0.1 10*3/uL (ref 0.0–0.1)
Basophils Relative: 0.7 % (ref 0.0–3.0)
Eosinophils Absolute: 0.1 10*3/uL (ref 0.0–0.7)
Eosinophils Relative: 1.3 % (ref 0.0–5.0)
HCT: 39.6 % (ref 36.0–46.0)
Hemoglobin: 13.2 g/dL (ref 12.0–15.0)
Lymphocytes Relative: 26.1 % (ref 12.0–46.0)
Lymphs Abs: 2.2 10*3/uL (ref 0.7–4.0)
MCHC: 33.2 g/dL (ref 30.0–36.0)
MCV: 87.1 fl (ref 78.0–100.0)
Monocytes Absolute: 0.7 10*3/uL (ref 0.1–1.0)
Monocytes Relative: 8 % (ref 3.0–12.0)
Neutro Abs: 5.5 10*3/uL (ref 1.4–7.7)
Neutrophils Relative %: 63.9 % (ref 43.0–77.0)
Platelets: 406 10*3/uL — ABNORMAL HIGH (ref 150.0–400.0)
RBC: 4.55 Mil/uL (ref 3.87–5.11)
RDW: 15.3 % (ref 11.5–15.5)
WBC: 8.5 10*3/uL (ref 4.0–10.5)

## 2019-12-20 LAB — BASIC METABOLIC PANEL
BUN: 22 mg/dL (ref 6–23)
CO2: 23 mEq/L (ref 19–32)
Calcium: 10.3 mg/dL (ref 8.4–10.5)
Chloride: 103 mEq/L (ref 96–112)
Creatinine, Ser: 1.39 mg/dL — ABNORMAL HIGH (ref 0.40–1.20)
GFR: 36.36 mL/min — ABNORMAL LOW (ref >60.00)
Glucose, Bld: 132 mg/dL — ABNORMAL HIGH (ref 70–99)
Potassium: 3.7 mEq/L (ref 3.5–5.1)
Sodium: 138 mEq/L (ref 135–145)

## 2019-12-20 LAB — HEPATIC FUNCTION PANEL
ALT: 22 U/L (ref 0–35)
AST: 21 U/L (ref 0–37)
Albumin: 4.2 g/dL (ref 3.5–5.2)
Alkaline Phosphatase: 86 U/L (ref 39–117)
Bilirubin, Direct: 0.2 mg/dL (ref 0.0–0.3)
Total Bilirubin: 0.8 mg/dL (ref 0.2–1.2)
Total Protein: 7.5 g/dL (ref 6.0–8.3)

## 2019-12-20 LAB — TSH: TSH: 2.68 u[IU]/mL (ref 0.35–4.50)

## 2019-12-20 LAB — D-DIMER, QUANTITATIVE: D-Dimer, Quant: 0.65 mcg/mL FEU — ABNORMAL HIGH (ref <0.50)

## 2019-12-20 MED ORDER — DEXILANT 60 MG PO CPDR: 60.0000 mg | DELAYED_RELEASE_CAPSULE | Freq: Every day | ORAL | 0 refills | Status: DC

## 2019-12-20 NOTE — Patient Instructions (Signed)
Gastroesophageal Reflux Disease, Adult Gastroesophageal reflux (GER) happens when acid from the stomach flows up into the tube that connects the mouth and the stomach (esophagus). Normally, food travels down the esophagus and stays in the stomach to be digested. However, when a person has GER, food and stomach acid sometimes move back up into the esophagus. If this becomes a more serious problem, the person may be diagnosed with a disease called gastroesophageal reflux disease (GERD). GERD occurs when the reflux:  Happens often.  Causes frequent or severe symptoms.  Causes problems such as damage to the esophagus. When stomach acid comes in contact with the esophagus, the acid may cause soreness (inflammation) in the esophagus. Over time, GERD may create small holes (ulcers) in the lining of the esophagus. What are the causes? This condition is caused by a problem with the muscle between the esophagus and the stomach (lower esophageal sphincter, or LES). Normally, the LES muscle closes after food passes through the esophagus to the stomach. When the LES is weakened or abnormal, it does not close properly, and that allows food and stomach acid to go back up into the esophagus. The LES can be weakened by certain dietary substances, medicines, and medical conditions, including:  Tobacco use.  Pregnancy.  Having a hiatal hernia.  Alcohol use.  Certain foods and beverages, such as coffee, chocolate, onions, and peppermint. What increases the risk? You are more likely to develop this condition if you:  Have an increased body weight.  Have a connective tissue disorder.  Use NSAID medicines. What are the signs or symptoms? Symptoms of this condition include:  Heartburn.  Difficult or painful swallowing.  The feeling of having a lump in the throat.  Abitter taste in the mouth.  Bad breath.  Having a large amount of saliva.  Having an upset or bloated  stomach.  Belching.  Chest pain. Different conditions can cause chest pain. Make sure you see your health care provider if you experience chest pain.  Shortness of breath or wheezing.  Ongoing (chronic) cough or a night-time cough.  Wearing away of tooth enamel.  Weight loss. How is this diagnosed? Your health care provider will take a medical history and perform a physical exam. To determine if you have mild or severe GERD, your health care provider may also monitor how you respond to treatment. You may also have tests, including:  A test to examine your stomach and esophagus with a small camera (endoscopy).  A test thatmeasures the acidity level in your esophagus.  A test thatmeasures how much pressure is on your esophagus.  A barium swallow or modified barium swallow test to show the shape, size, and functioning of your esophagus. How is this treated? The goal of treatment is to help relieve your symptoms and to prevent complications. Treatment for this condition may vary depending on how severe your symptoms are. Your health care provider may recommend:  Changes to your diet.  Medicine.  Surgery. Follow these instructions at home: Eating and drinking   Follow a diet as recommended by your health care provider. This may involve avoiding foods and drinks such as: ? Coffee and tea (with or without caffeine). ? Drinks that containalcohol. ? Energy drinks and sports drinks. ? Carbonated drinks or sodas. ? Chocolate and cocoa. ? Peppermint and mint flavorings. ? Garlic and onions. ? Horseradish. ? Spicy and acidic foods, including peppers, chili powder, curry powder, vinegar, hot sauces, and barbecue sauce. ? Citrus fruit juices and citrus   fruits, such as oranges, lemons, and limes. ? Tomato-based foods, such as red sauce, chili, salsa, and pizza with red sauce. ? Fried and fatty foods, such as donuts, french fries, potato chips, and high-fat dressings. ? High-fat  meats, such as hot dogs and fatty cuts of red and white meats, such as rib eye steak, sausage, ham, and bacon. ? High-fat dairy items, such as whole milk, butter, and cream cheese.  Eat small, frequent meals instead of large meals.  Avoid drinking large amounts of liquid with your meals.  Avoid eating meals during the 2-3 hours before bedtime.  Avoid lying down right after you eat.  Do not exercise right after you eat. Lifestyle   Do not use any products that contain nicotine or tobacco, such as cigarettes, e-cigarettes, and chewing tobacco. If you need help quitting, ask your health care provider.  Try to reduce your stress by using methods such as yoga or meditation. If you need help reducing stress, ask your health care provider.  If you are overweight, reduce your weight to an amount that is healthy for you. Ask your health care provider for guidance about a safe weight loss goal. General instructions  Pay attention to any changes in your symptoms.  Take over-the-counter and prescription medicines only as told by your health care provider. Do not take aspirin, ibuprofen, or other NSAIDs unless your health care provider told you to do so.  Wear loose-fitting clothing. Do not wear anything tight around your waist that causes pressure on your abdomen.  Raise (elevate) the head of your bed about 6 inches (15 cm).  Avoid bending over if this makes your symptoms worse.  Keep all follow-up visits as told by your health care provider. This is important. Contact a health care provider if:  You have: ? New symptoms. ? Unexplained weight loss. ? Difficulty swallowing or it hurts to swallow. ? Wheezing or a persistent cough. ? A hoarse voice.  Your symptoms do not improve with treatment. Get help right away if you:  Have pain in your arms, neck, jaw, teeth, or back.  Feel sweaty, dizzy, or light-headed.  Have chest pain or shortness of breath.  Vomit and your vomit looks  like blood or coffee grounds.  Faint.  Have stool that is bloody or black.  Cannot swallow, drink, or eat. Summary  Gastroesophageal reflux happens when acid from the stomach flows up into the esophagus. GERD is a disease in which the reflux happens often, causes frequent or severe symptoms, or causes problems such as damage to the esophagus.  Treatment for this condition may vary depending on how severe your symptoms are. Your health care provider may recommend diet and lifestyle changes, medicine, or surgery.  Contact a health care provider if you have new or worsening symptoms.  Take over-the-counter and prescription medicines only as told by your health care provider. Do not take aspirin, ibuprofen, or other NSAIDs unless your health care provider told you to do so.  Keep all follow-up visits as told by your health care provider. This is important. This information is not intended to replace advice given to you by your health care provider. Make sure you discuss any questions you have with your health care provider. Document Revised: 03/02/2018 Document Reviewed: 03/02/2018 Elsevier Patient Education  2020 [employer]  

## 2019-12-20 NOTE — Progress Notes (Signed)
Subjective:  Patient ID: Melanie Hendricks, female    DOB: 1936/07/26  Age: 84 y.o. MRN: [id number]  CC: Annual Exam, Hypertension, and Hyperlipidemia  This visit occurred during the SARS-CoV-2 public health emergency.  Safety protocols were in place, including screening questions prior to the visit, additional usage of staff PPE, and extensive cleaning of exam room while observing appropriate contact time as indicated for disinfecting solutions.    HPI Melanie Hendricks presents for a CPX.  She tells me she has been feeling poorly for 2 months with heartburn, chest pain that radiates into her back, trouble swallowing carbonated drinks, odynophagia, laryngitis, shortness of breath, choking, chronic cough, weight loss, and chest pain under her sternum.  She is also had a few episodes of orthostatic dizziness and lightheadedness.  Outpatient Medications Prior to Visit  Medication Sig Dispense Refill  . ASPIRIN 81 PO Take by mouth.    Marland Kitchen atorvastatin (LIPITOR) 40 MG tablet Take 1 tablet (40 mg total) by mouth daily at 6 PM. 30 tablet 0  . telmisartan (MICARDIS) 40 MG tablet TAKE 1 TABLET BY MOUTH EVERY DAY 30 tablet 0   No facility-administered medications prior to visit.    ROS Review of Systems  Constitutional: Positive for unexpected weight change (wt loss). Negative for appetite change, chills, diaphoresis and fatigue.  HENT: Positive for trouble swallowing and voice change. Negative for sore throat.   Eyes: Negative.   Respiratory: Positive for cough, choking and shortness of breath. Negative for chest tightness, wheezing and stridor.   Cardiovascular: Positive for chest pain. Negative for palpitations and leg swelling.  Gastrointestinal: Negative for abdominal pain, blood in stool, constipation, diarrhea, nausea and vomiting.  Endocrine: Negative.   Genitourinary: Negative.  Negative for difficulty urinating.  Musculoskeletal: Positive for back pain. Negative for arthralgias, gait  problem, joint swelling, myalgias and neck pain.  Skin: Negative.   Neurological: Negative.   Hematological: Negative for adenopathy. Does not bruise/bleed easily.  Psychiatric/Behavioral: Negative.     Objective:  BP 110/60 (BP Location: Left Arm, Patient Position: Sitting, Cuff Size: Large)   Pulse (!) 125   Temp 98.3 F (36.8 C) (Oral)   Ht 5\' 6"  (1.676 m)   Wt 172 lb 8 oz (78.2 kg)   SpO2 97%   BMI 27.84 kg/m   BP Readings from Last 3 Encounters:  12/20/19 110/60  11/15/18 (!) 160/70  04/25/18 (!) 142/86    Wt Readings from Last 3 Encounters:  12/20/19 172 lb 8 oz (78.2 kg)  11/15/18 195 lb (88.5 kg)  04/25/18 199 lb 8 oz (90.5 kg)    Physical Exam Constitutional:      Appearance: Normal appearance.  HENT:     Nose: Nose normal.     Mouth/Throat:     Mouth: Mucous membranes are moist.  Eyes:     General: No scleral icterus.    Conjunctiva/sclera: Conjunctivae normal.  Cardiovascular:     Rate and Rhythm: Regular rhythm. Tachycardia present.     Heart sounds: Normal heart sounds, S1 normal and S2 normal. No murmur. No friction rub. No gallop.      Comments: EKG - Sinus tachycardia, 114 bpm Low voltage No LVH No Q waves, No ST/T wave changes Pulmonary:     Effort: Pulmonary effort is normal.     Breath sounds: No stridor. No wheezing, rhonchi or rales.  Abdominal:     General: Abdomen is flat.     Palpations: There is no mass.  Tenderness: There is no abdominal tenderness. There is no guarding.     Hernia: No hernia is present.  Musculoskeletal:     Cervical back: Neck supple.     Right lower leg: No edema.     Left lower leg: No edema.  Lymphadenopathy:     Cervical: No cervical adenopathy.  Skin:    General: Skin is warm and dry.     Coloration: Skin is not pale.  Neurological:     General: No focal deficit present.     Mental Status: She is alert.  Psychiatric:        Attention and Perception: She is inattentive.        Speech: Speech is  delayed and tangential.        Behavior: Behavior is slowed and withdrawn.        Thought Content: Thought content normal.        Cognition and Memory: Cognition is not impaired. Memory is not impaired.     Lab Results  Component Value Date   WBC 8.5 12/20/2019   HGB 13.2 12/20/2019   HCT 39.6 12/20/2019   PLT 406.0 (H) 12/20/2019   GLUCOSE 132 (H) 12/20/2019   CHOL 163 12/20/2019   TRIG 85.0 12/20/2019   HDL 74.20 12/20/2019   LDLCALC 72 12/20/2019   ALT 22 12/20/2019   AST 21 12/20/2019   NA 138 12/20/2019   K 3.7 12/20/2019   CL 103 12/20/2019   CREATININE 1.39 (H) 12/20/2019   BUN 22 12/20/2019   CO2 23 12/20/2019   TSH 2.68 12/20/2019   INR 1.00 10/23/2016   HGBA1C 5.9 11/15/2018    DG BONE DENSITY (DXA)  Result Date: 12/03/2016 Date of study: 12/02/2016 Exam: DUAL X-RAY ABSORPTIOMETRY (DXA) FOR BONE MINERAL DENSITY (BMD) Instrument: [employer] Provider: PCP Indication: Screening for low BMD Comparison: none (please note that it is not possible to compare data from different instruments) Clinical data: Pt is a 84 y.o. female with previous history of humeral fracture. On calcium and vitamin D. Results:  Lumbar spine (L1-L4) Femoral neck (FN) T-score -1.5  RFN: -2.2 LFN: -2.2  Assessment: the BMD is low according to the [facility] classification for osteoporosis (see below). Fracture risk: moderate FRAX score: 10 year major osteoporotic risk: 22.4%. 10 year hip fracture risk: 6.2%. These are above the thresholds for treatment of 20% and 3%, respectively. Comments: the technical quality of the study is good. Evaluation for secondary causes should be considered if clinically indicated. Recommend optimizing calcium (1200 mg/day) and vitamin D (800 IU/day) intake. Followup: Repeat BMD is appropriate after 2 years or after 1-2 years if starting treatment. WHO criteria for diagnosis of osteoporosis in postmenopausal women and in men 48 y/o or older: - normal: T-score -1.0  to + 1.0 - osteopenia/low bone density: T-score between -2.5 and -1.0 - osteoporosis: T-score below -2.5 - severe osteoporosis: T-score below -2.5 with history of fragility fracture Note: although not part of the WHO classification, the presence of a fragility fracture, regardless of the T-score, should be considered diagnostic of osteoporosis, provided other causes for the fracture have been excluded. Treatment: The National Osteoporosis Foundation recommends that treatment be considered in postmenopausal women and men age 9 or older with: 1. Hip or vertebral (clinical or morphometric) fracture 2. T-score of - 2.5 or lower at the spine or hip 3. 10-year fracture probability by FRAX of at least 20% for a major osteoporotic fracture and 3% for a hip fracture  Philemon Kingdom, MD [facility]   No results found.  Assessment & Plan:   Otillia was seen today for annual exam, hypertension and hyperlipidemia.  Diagnoses and all orders for this visit:  Benign essential HTN- Her blood pressure is overcontrolled.  I recommended that she stop taking the ARB. -     CBC with Differential/Platelet; Future -     TSH; Future -     TSH -     CBC with Differential/Platelet  Stage 3a chronic kidney disease- Her creatinine clearance is 33.5.  She is avoiding nephrotoxic agents.  Her systolic blood pressure is low so we will discontinue the ARB. -     Basic metabolic panel; Future -     Urinalysis, Routine w reflex microscopic; Future -     Urinalysis, Routine w reflex microscopic -     Basic metabolic panel  Dyslipidemia, goal LDL below 70- She has achieved her LDL goal and is doing well on the statin. -     Lipid panel; Future -     TSH; Future -     Hepatic function panel; Future -     Hepatic function panel -     TSH -     Lipid panel  Encounter for general adult medical examination with abnormal findings- Exam completed, labs reviewed, vaccines are up-to-date, no cancer screenings are  indicated.  Esophageal dysphagia- I have asked her to see GI to see if she needs to undergo upper endoscopy to look for pathology. -     Ambulatory referral to Gastroenterology  Gastroesophageal reflux disease with esophagitis, unspecified whether hemorrhage-I have asked her to start taking a PPI for this. -     dexlansoprazole (DEXILANT) 60 MG capsule; Take 1 capsule (60 mg total) by mouth daily.  Chronic bronchitis, mucopurulent ([place]) -     Ambulatory referral to Pulmonology  Cough- Her chest x-ray is negative for mass or infiltrate. -     DG Chest 2 View; Future  Increased heart rate- Her work-up for secondary causes is negative.  I have asked her to see pulmonary to start treatment of COPD. -     EKG 12-Lead -     D-dimer, quantitative (not at [facility]); Future -     D-dimer, quantitative (not at [facility])   I have discontinued Adelind Kowall. Callejo's telmisartan and ASPIRIN 81 PO. I am also having her start on Dexilant. Additionally, I am having her maintain her atorvastatin.  Meds ordered this encounter  Medications  . dexlansoprazole (DEXILANT) 60 MG capsule    Sig: Take 1 capsule (60 mg total) by mouth daily.    Dispense:  90 capsule    Refill:  0  . atorvastatin (LIPITOR) 40 MG tablet    Sig: Take 1 tablet (40 mg total) by mouth daily at 6 PM.    Dispense:  90 tablet    Refill:  1   In addition to time spent on CPE, I spent 50 minutes in preparing to see the patient by review of recent labs, imaging and procedures, obtaining and reviewing separately obtained history, communicating with the patient and family or caregiver, ordering medications, tests or procedures, and documenting clinical information in the EHR including the differential Dx, treatment, and any further evaluation and other management of 1. Benign essential HTN 2. Stage 3a chronic kidney disease 3. Dyslipidemia, goal LDL below 70 4. Esophageal dysphagia 5. Gastroesophageal reflux disease with esophagitis, unspecified  whether hemorrhage 6. Chronic bronchitis, mucopurulent ([place]) 7.  Cough 8. Increased heart rate   Follow-up: Return in about 6 weeks (around 01/31/2020).  Scarlette Calico, MD

## 2019-12-23 MED ORDER — ATORVASTATIN CALCIUM 40 MG PO TABS: 40.0000 mg | ORAL_TABLET | Freq: Every day | ORAL | 1 refills | Status: DC

## 2020-01-01 ENCOUNTER — Encounter: Payer: Self-pay | Admitting: Physician Assistant

## 2020-01-12 ENCOUNTER — Encounter: Payer: Self-pay | Admitting: Physician Assistant

## 2020-01-12 ENCOUNTER — Ambulatory Visit: Payer: Medicare Other | Admitting: Physician Assistant

## 2020-01-12 VITALS — BP 122/80 | HR 119 | Temp 97.8°F | Ht 66.0 in | Wt 172.0 lb

## 2020-01-12 DIAGNOSIS — K219 Gastro-esophageal reflux disease without esophagitis: Secondary | ICD-10-CM

## 2020-01-12 DIAGNOSIS — R1314 Dysphagia, pharyngoesophageal phase: Secondary | ICD-10-CM | POA: Diagnosis not present

## 2020-01-12 MED ORDER — PANTOPRAZOLE SODIUM 40 MG PO TBEC: 40.0000 mg | DELAYED_RELEASE_TABLET | Freq: Two times a day (BID) | ORAL | 5 refills | Status: DC

## 2020-01-12 NOTE — Patient Instructions (Signed)
If you are age 82 or older, your body mass index should be between 23-30. Your Body mass index is 27.76 kg/m. If this is out of the aforementioned range listed, please consider follow up with your Primary Care Provider.  If you are age 78 or younger, your body mass index should be between 19-25. Your Body mass index is 27.76 kg/m. If this is out of the aformentioned range listed, please consider follow up with your Primary Care Provider.   We have sent the following medications to your pharmacy for you to pick up at your convenience: Pantoprazole 40 mg twice daily 30-60 minutes before breakfast and dinner.

## 2020-01-12 NOTE — Progress Notes (Signed)
Reviewed and agree with documentation and assessment and plan. K. Veena Jareli Highland , MD   

## 2020-01-12 NOTE — Progress Notes (Signed)
Chief Complaint: GERD, dysphagia  HPI:    Melanie Hendricks is an 84 year old female with a past medical history as listed below including stroke, who was referred to me by Janith Lima, MD for a complaint of dysphagia and GERD.      Today, the patient presents to clinic and explains that she saw her PCP in regards to "an attack of reflux" which occurred about 6 weeks ago.  Tells me this was very painful and she had a lot of burning and acid in her throat which lasted for a few days at a time.  Tells me she ate a deviled egg and a hot dog which she thinks brought it on.  Since then has continued with a painful throat as well as trouble swallowing telling me that even Cheerios and a banana that she tried to eat this morning seemed to get stuck on their way down and it was painful.  Denies any further overt reflux symptoms and no epigastric pain.  Tells me this has never happened to her before.  Was previously on an ASA 81 mg daily which was stopped in March by her PCP.  Has lost 20 pounds over the past 6 weeks because she is not able to eat much other than yogurt etc.  Explains that her PCP prescribed a medication for her but she did not take it because it was $300 and she felt like she was "healing a little bit".    Denies fever, chills, nausea, vomiting, abdominal pain or symptoms that awaken her from sleep.  Past Medical History:  Diagnosis Date  . Allergy   . Bronchitis   . Fx humeral neck   . Hepatitis   . Hyperlipidemia   . Hypertension   . Skin melanoma ([place])   . Stroke [facility])     Past Surgical History:  Procedure Laterality Date  . Melenoma removal    . TONSILLECTOMY      Current Outpatient Medications  Medication Sig Dispense Refill  . atorvastatin (LIPITOR) 40 MG tablet Take 1 tablet (40 mg total) by mouth daily at 6 PM. 90 tablet 1  . pantoprazole (PROTONIX) 40 MG tablet Take 1 tablet (40 mg total) by mouth 2 (two) times daily before a meal. 60 tablet 5   No current  facility-administered medications for this visit.    Allergies as of 01/12/2020 - Review Complete 12/20/2019  Allergen Reaction Noted  . Lisinopril Cough 07/21/2017  . Other Other (See Comments) 10/23/2016    Family History  Problem Relation Age of Onset  . Stomach cancer Mother   . Thyroid cancer Father   . Heart attack Father   . Seizures Brother   . Heart failure Brother   . Hypertension Maternal Grandmother     Social History   Socioeconomic History  . Marital status: Divorced    Spouse name: Not on file  . Number of children: 3  . Years of education: 70  . Highest education level: Not on file  Occupational History  . Occupation: Retired  Tobacco Use  . Smoking status: Former Smoker    Packs/day: 0.50    Years: 6.00    Pack years: 3.00    Types: Cigarettes    Quit date: 1968    Years since quitting: 53.3  . Smokeless tobacco: Never Used  Substance and Sexual Activity  . Alcohol use: No  . Drug use: No  . Sexual activity: Not on file  Other Topics Concern  .  Not on file  Social History Narrative   Fun: Gardening and sewing   Denies abuse and feels safe at home.    Lives w/ her son and daughter-in-law   Right-handed   Caffeine: 1 cup of coffee and 1 Coke per day   Social Determinants of Health   Financial Resource Strain:   . Difficulty of Paying Living Expenses:   Food Insecurity:   . Worried About Charity fundraiser in the Last Year:   . Arboriculturist in the Last Year:   Transportation Needs:   . Film/video editor (Medical):   Marland Kitchen Lack of Transportation (Non-Medical):   Physical Activity:   . Days of Exercise per Week:   . Minutes of Exercise per Session:   Stress:   . Feeling of Stress :   Social Connections:   . Frequency of Communication with Friends and Family:   . Frequency of Social Gatherings with Friends and Family:   . Attends Religious Services:   . Active Member of Clubs or Organizations:   . Attends Archivist  Meetings:   Marland Kitchen Marital Status:   Intimate Partner Violence:   . Fear of Current or Ex-Partner:   . Emotionally Abused:   Marland Kitchen Physically Abused:   . Sexually Abused:     Review of Systems:    Constitutional: No weight loss, fever or chills Skin: No rash  Cardiovascular: No chest pain Respiratory: No SOB Gastrointestinal: See HPI and otherwise negative Genitourinary: No dysuria  Neurological: No headache, dizziness or syncope Musculoskeletal: No new muscle or joint pain Hematologic: No bleeding  Psychiatric: No history of depression or anxiety   Physical Exam:  Vital signs: BP 122/80   Pulse (!) 119   Temp 97.8 F (36.6 C)   Ht 5\' 6"  (1.676 m)   Wt 172 lb (78 kg)   SpO2 98%   BMI 27.76 kg/m   Constitutional:   Pleasant Caucasian female appears to be in NAD, Well developed, Well nourished, alert and cooperative Head:  Normocephalic and atraumatic. Eyes:   PEERL, EOMI. No icterus. Conjunctiva pink. Ears:  Normal auditory acuity. Neck:  Supple Throat: Oral cavity and pharynx without inflammation, swelling or lesion.  Respiratory: Respirations even and unlabored. Lungs clear to auscultation bilaterally.   No wheezes, crackles, or rhonchi.  Cardiovascular: Normal S1, S2. No MRG. Regular rate and rhythm. No peripheral edema, cyanosis or pallor.  Gastrointestinal:  Soft, nondistended, nontender. No rebound or guarding. Normal bowel sounds. No appreciable masses or hepatomegaly. Rectal:  Not performed.  Msk:  Symmetrical without gross deformities. Without edema, no deformity or joint abnormality.  Neurologic:  Alert and  oriented x4;  grossly normal neurologically.  Skin:   Dry and intact without significant lesions or rashes. Psychiatric:  Demonstrates good judgement and reason without abnormal affect or behaviors.  MOST RECENT LABS AND IMAGING: CBC    Component Value Date/Time   WBC 8.5 12/20/2019 0916   RBC 4.55 12/20/2019 0916   HGB 13.2 12/20/2019 0916   HCT 39.6  12/20/2019 0916   PLT 406.0 (H) 12/20/2019 0916   MCV 87.1 12/20/2019 0916   MCH 29.6 10/24/2016 0218   MCHC 33.2 12/20/2019 0916   RDW 15.3 12/20/2019 0916   LYMPHSABS 2.2 12/20/2019 0916   MONOABS 0.7 12/20/2019 0916   EOSABS 0.1 12/20/2019 0916   BASOSABS 0.1 12/20/2019 0916    CMP     Component Value Date/Time   NA 138 12/20/2019 0916  K 3.7 12/20/2019 0916   CL 103 12/20/2019 0916   CO2 23 12/20/2019 0916   GLUCOSE 132 (H) 12/20/2019 0916   BUN 22 12/20/2019 0916   CREATININE 1.39 (H) 12/20/2019 0916   CALCIUM 10.3 12/20/2019 0916   PROT 7.5 12/20/2019 0916   ALBUMIN 4.2 12/20/2019 0916   AST 21 12/20/2019 0916   ALT 22 12/20/2019 0916   ALKPHOS 86 12/20/2019 0916   BILITOT 0.8 12/20/2019 0916   GFRNONAA >60 10/24/2016 0218   GFRAA >60 10/24/2016 0218    Assessment: 1.  Dysphagia: Trouble swallowing most solids over the past 6 weeks, also some pain when swallowing; consider esophagitis+/-stricture 2.  GERD: Severe symptoms about 6 weeks ago which have since decreased, but residual dysphagia and esophageal pain as above  Plan: 1.  Patient is reluctant to really do anything for further evaluation.  She tells me "I am 83 and I am just going to decide not to do some things at this point".  We discussed barium swallow with tablet and patient would prefer not to do this, she is somewhat fearful that it may cause her pain to even do the x-ray, even after reassurance. 2.  I explained that if the barium swallow showed a stricture then we could discuss an EGD.  Again she is reluctant for this procedure. 3.  Encouraged the patient to start a PPI.  Prescribed Pantoprazole 40 mg twice daily, 30 to 60 minutes before breakfast and dinner with 3 refills.  Explained how this medication works and hopefully will help her symptoms and start to heal any inflammation that she has.  She verbalized understanding. 4.  Reviewed antidysphagia measures. 5.  Patient will call and let us know how  she is doing over the next couple of weeks, if symptoms worsen or not getting any better then would recommend a barium swallow.  She is aware.  Otherwise patient will follow in clinic with me in 4 to 6 weeks.  She was assigned to Dr. Silverio Decamp today.  Ellouise Newer, PA-C [facility] 01/12/2020, 9:46 AM  Cc: Janith Lima, MD

## 2020-01-12 NOTE — Progress Notes (Signed)
Reviewed and agree with documentation and assessment and plan. K. Veena Nandigam , MD   

## 2020-01-18 ENCOUNTER — Other Ambulatory Visit: Payer: Self-pay | Admitting: Internal Medicine

## 2020-01-18 DIAGNOSIS — I10 Essential (primary) hypertension: Secondary | ICD-10-CM

## 2020-02-02 ENCOUNTER — Institutional Professional Consult (permissible substitution): Payer: Medicare Other | Admitting: Emergency Medicine

## 2020-02-03 ENCOUNTER — Other Ambulatory Visit: Payer: Self-pay | Admitting: Internal Medicine

## 2020-02-03 DIAGNOSIS — I10 Essential (primary) hypertension: Secondary | ICD-10-CM

## 2020-02-12 ENCOUNTER — Ambulatory Visit: Payer: Medicare Other | Admitting: Internal Medicine

## 2020-02-15 ENCOUNTER — Other Ambulatory Visit: Payer: Self-pay | Admitting: Internal Medicine

## 2020-02-15 DIAGNOSIS — I10 Essential (primary) hypertension: Secondary | ICD-10-CM

## 2020-02-28 ENCOUNTER — Other Ambulatory Visit: Payer: Self-pay

## 2020-02-28 ENCOUNTER — Ambulatory Visit: Payer: Medicare Other | Admitting: Emergency Medicine

## 2020-02-28 ENCOUNTER — Encounter: Payer: Self-pay | Admitting: Emergency Medicine

## 2020-02-28 DIAGNOSIS — R05 Cough: Secondary | ICD-10-CM | POA: Diagnosis not present

## 2020-02-28 DIAGNOSIS — R053 Chronic cough: Secondary | ICD-10-CM

## 2020-02-28 NOTE — Progress Notes (Signed)
Subjective:    Patient ID: Melanie Hendricks, female    DOB: [birth year], 84 y.o.   MRN: [id number]  HPI 84 year old woman, former minimal smoker (6 pack years), with a history of hypertension, GERD, allergic rhinitis.  I seen her in the past for chronic cough and hoarseness.  She was formally on lisinopril, stopped remotely.  She has also been tried on Breo in the past without much change.  Spirometry has shown restriction without bronchodilator response.   She returns today for for further evaluation of her chronic cough. She had flaring of her GERD / indigestion / dysphagia and odynophagia in March and April, ? How this impacted cough - she initially said the cough increased, then says that the cough has been the same all allong. Productive of clear mucous. She was treated with pantoprazole bid. She took this for 3 weeks, has since stopped it. She occasionally takes loratadine - does seem help with the cough. She recalls that nasal steroid used to help her in the past.   She has had some choking episodes w food - has tried to modify her diet. She was offered a MBS but deferred.   Discussed possibly repeating her PFT today - she wants to defer, concentrate more on her GI issues.   Chest x-ray 12/20/2019 reviewed by me, shows no infiltrates, no abnormalities. Pulmonary function testing 08/20/2017 reviewed, shows principally restriction, no bronchodilator response, normal lung volumes, decreased diffusion capacity that corrects to the normal range when adjusted for alveolar volume.   Review of Systems As per HPI  Past Medical History:  Diagnosis Date  . Allergy   . Bronchitis   . Fx humeral neck   . Hepatitis   . Hyperlipidemia   . Hypertension   . Skin melanoma ([place])   . Stroke [facility])      Family History  Problem Relation Age of Onset  . Stomach cancer Mother   . Thyroid cancer Father   . Heart attack Father   . Seizures Brother   . Heart failure Brother   . Hypertension  Maternal Grandmother      Social History   Socioeconomic History  . Marital status: Divorced    Spouse name: Not on file  . Number of children: 3  . Years of education: 68  . Highest education level: Not on file  Occupational History  . Occupation: Retired  Tobacco Use  . Smoking status: Former Smoker    Packs/day: 0.50    Years: 6.00    Pack years: 3.00    Types: Cigarettes    Quit date: 1968    Years since quitting: 53.5  . Smokeless tobacco: Never Used  Vaping Use  . Vaping Use: Never used  Substance and Sexual Activity  . Alcohol use: No  . Drug use: No  . Sexual activity: Not on file  Other Topics Concern  . Not on file  Social History Narrative   Fun: Gardening and sewing   Denies abuse and feels safe at home.    Lives w/ her son and daughter-in-law   Right-handed   Caffeine: 1 cup of coffee and 1 Coke per day   Social Determinants of Health   Financial Resource Strain:   . Difficulty of Paying Living Expenses:   Food Insecurity:   . Worried About Charity fundraiser in the Last Year:   . Arboriculturist in the Last Year:   Transportation Needs:   . Lack of Transportation (  Medical):   Marland Kitchen Lack of Transportation (Non-Medical):   Physical Activity:   . Days of Exercise per Week:   . Minutes of Exercise per Session:   Stress:   . Feeling of Stress :   Social Connections:   . Frequency of Communication with Friends and Family:   . Frequency of Social Gatherings with Friends and Family:   . Attends Religious Services:   . Active Member of Clubs or Organizations:   . Attends Archivist Meetings:   Marland Kitchen Marital Status:   Intimate Partner Violence:   . Fear of Current or Ex-Partner:   . Emotionally Abused:   Marland Kitchen Physically Abused:   . Sexually Abused:      Allergies  Allergen Reactions  . Lisinopril Cough  . Other Other (See Comments)    Powder from latex gloves, aerosol sprays, perfumes - causes cough     Outpatient Medications Prior to Visit   Medication Sig Dispense Refill  . atorvastatin (LIPITOR) 40 MG tablet Take 1 tablet (40 mg total) by mouth daily at 6 PM. 90 tablet 1  . pantoprazole (PROTONIX) 40 MG tablet Take 1 tablet (40 mg total) by mouth 2 (two) times daily before a meal. (Patient not taking: Reported on 02/28/2020) 60 tablet 5   No facility-administered medications prior to visit.        Objective:   Physical Exam Vitals:   02/28/20 0909  BP: 136/78  Pulse: 78  SpO2: 98%  Weight: 173 lb 9.6 oz (78.7 kg)  Height: 5\' 6"  (1.676 m)   Gen: Pleasant, well-nourished, in no distress,  normal affect  ENT: No lesions,  mouth clear,  oropharynx clear, no postnasal drip  Neck: No JVD, no stridor  Lungs: No use of accessory muscles, no crackles or wheezing on normal respiration, no wheeze on forced expiration  Cardiovascular: RRR, heart sounds normal, no murmur or gallops, no peripheral edem  Musculoskeletal: No deformities, no cyanosis or clubbing  Neuro: alert, awake, non focal   Skin: Warm, no lesions or rash      Assessment & Plan:  Chronic cough She describes cough this often dry, sometimes productive of clear mucus.  No purulence.  Factors that appeared impacted include allergies based on her improvement with loratadine, her GI symptoms that include GERD, regurgitation, dysphagia.  This being said she has not wanted to undertake medications to address, does not want any further GI work-up.  I offered pulmonary function test to see if there is any component of obstruction that might benefit bronchodilator therapy.  She like to defer this as well.  I think she would benefit from loratadine every day but I am not sure she wants to take this either.  For now the work-up is on hold and any additional medications are on hold.  If she changes her mind then we could try to initiate.  Influences on your cough appear to include that with allergic symptoms (that have responded to loratadine) and also your  gastrointestinal issues.  The best way to decrease her cough frequency would be to address both of these. You might benefit from repeat pulmonary function testing to see if inhaled medication could be helpful for breathing or cough.  You can let us know if you decide you want to do this. You could try taking your loratadine (Claritin) 10 mg every day and see how this impacts your cough. Continue follow-up with gastroenterology if you choose to do so.  You would probably benefit from  taking the pantoprazole 40 mg once daily.  Consider getting the modified barium swallow as you have discussed with them. Follow with Dr Lamonte Sakai if you would like to consider performing the pulmonary function testing or to discuss any changes in her cough or other testing to further evaluate.    Baltazar Apo, MD, PhD 02/28/2020, 9:53 AM [facility] [phone] or if no answer [phone]

## 2020-02-28 NOTE — Patient Instructions (Addendum)
Influences on your cough appear to include that with allergic symptoms (that have responded to loratadine) and also your gastrointestinal issues.  The best way to decrease her cough frequency would be to address both of these. You might benefit from repeat pulmonary function testing to see if inhaled medication could be helpful for breathing or cough.  You can let us know if you decide you want to do this. You could try taking your loratadine (Claritin) 10 mg every day and see how this impacts your cough. Continue follow-up with gastroenterology if you choose to do so.  You would probably benefit from taking the pantoprazole 40 mg once daily.  Consider getting the modified barium swallow as you have discussed with them. Follow with Dr Lamonte Sakai if you would like to consider performing the pulmonary function testing or to discuss any changes in her cough or other testing to further evaluate.

## 2020-02-28 NOTE — Assessment & Plan Note (Signed)
She describes cough this often dry, sometimes productive of clear mucus.  No purulence.  Factors that appeared impacted include allergies based on her improvement with loratadine, her GI symptoms that include GERD, regurgitation, dysphagia.  This being said she has not wanted to undertake medications to address, does not want any further GI work-up.  I offered pulmonary function test to see if there is any component of obstruction that might benefit bronchodilator therapy.  She like to defer this as well.  I think she would benefit from loratadine every day but I am not sure she wants to take this either.  For now the work-up is on hold and any additional medications are on hold.  If she changes her mind then we could try to initiate.  Influences on your cough appear to include that with allergic symptoms (that have responded to loratadine) and also your gastrointestinal issues.  The best way to decrease her cough frequency would be to address both of these. You might benefit from repeat pulmonary function testing to see if inhaled medication could be helpful for breathing or cough.  You can let us know if you decide you want to do this. You could try taking your loratadine (Claritin) 10 mg every day and see how this impacts your cough. Continue follow-up with gastroenterology if you choose to do so.  You would probably benefit from taking the pantoprazole 40 mg once daily.  Consider getting the modified barium swallow as you have discussed with them. Follow with Dr Lamonte Sakai if you would like to consider performing the pulmonary function testing or to discuss any changes in her cough or other testing to further evaluate.

## 2020-03-06 ENCOUNTER — Ambulatory Visit (INDEPENDENT_AMBULATORY_CARE_PROVIDER_SITE_OTHER): Payer: Medicare Other | Admitting: Internal Medicine

## 2020-03-06 ENCOUNTER — Encounter: Payer: Self-pay | Admitting: Internal Medicine

## 2020-03-06 ENCOUNTER — Other Ambulatory Visit: Payer: Self-pay

## 2020-03-06 VITALS — BP 156/72 | HR 90 | Temp 97.8°F | Resp 16 | Ht 66.0 in | Wt 171.2 lb

## 2020-03-06 DIAGNOSIS — E785 Hyperlipidemia, unspecified: Secondary | ICD-10-CM | POA: Diagnosis not present

## 2020-03-06 DIAGNOSIS — I10 Essential (primary) hypertension: Secondary | ICD-10-CM

## 2020-03-06 DIAGNOSIS — K2100 Gastro-esophageal reflux disease with esophagitis, without bleeding: Secondary | ICD-10-CM

## 2020-03-06 DIAGNOSIS — N1831 Chronic kidney disease, stage 3a: Secondary | ICD-10-CM

## 2020-03-06 MED ORDER — PANTOPRAZOLE SODIUM 40 MG PO TBEC: 40.0000 mg | DELAYED_RELEASE_TABLET | Freq: Two times a day (BID) | ORAL | 5 refills | Status: AC

## 2020-03-06 NOTE — Progress Notes (Signed)
Subjective:  Patient ID: Melanie Hendricks, female    DOB: [birth year]  Age: 84 y.o. MRN: [id number]  CC: Hypertension  This visit occurred during the SARS-CoV-2 public health emergency.  Safety protocols were in place, including screening questions prior to the visit, additional usage of staff PPE, and extensive cleaning of exam room while observing appropriate contact time as indicated for disinfecting solutions.    HPI BRAELYNNE GARINGER presents for f/up - She tells me her blood pressure has been well controlled with no antihypertensives.  She has been seen pulmonary and is being evaluated for chronic cough.  She tells me she is taking the GERD and her heartburn has been well controlled.  She denies odynophagia, dysphagia, loss of appetite, or weight loss.  She has decided not to take a statin.  Outpatient Medications Prior to Visit  Medication Sig Dispense Refill  . atorvastatin (LIPITOR) 40 MG tablet Take 1 tablet (40 mg total) by mouth daily at 6 PM. 90 tablet 1  . pantoprazole (PROTONIX) 40 MG tablet Take 1 tablet (40 mg total) by mouth 2 (two) times daily before a meal. (Patient not taking: Reported on 02/28/2020) 60 tablet 5   No facility-administered medications prior to visit.    ROS Review of Systems  Constitutional: Negative.  Negative for appetite change, diaphoresis, fatigue and unexpected weight change.  HENT: Negative for trouble swallowing and voice change.   Eyes: Negative.   Respiratory: Positive for cough. Negative for chest tightness, shortness of breath and wheezing.   Cardiovascular: Negative for chest pain, palpitations and leg swelling.  Gastrointestinal: Negative for abdominal pain, constipation, diarrhea, nausea and vomiting.  Endocrine: Negative.   Genitourinary: Negative.  Negative for difficulty urinating.  Musculoskeletal: Negative.  Negative for arthralgias and myalgias.  Skin: Negative.   Neurological: Negative for dizziness, weakness and  light-headedness.  Hematological: Negative for adenopathy. Does not bruise/bleed easily.  Psychiatric/Behavioral: Positive for confusion.    Objective:  BP (!) 156/72 (BP Location: Left Arm, Patient Position: Sitting, Cuff Size: Normal)   Pulse 90   Temp 97.8 F (36.6 C) (Oral)   Resp 16   Ht 5\' 6"  (1.676 m)   Wt 171 lb 4 oz (77.7 kg)   SpO2 98%   BMI 27.64 kg/m   BP Readings from Last 3 Encounters:  03/06/20 (!) 156/72  02/28/20 136/78  01/12/20 122/80    Wt Readings from Last 3 Encounters:  03/06/20 171 lb 4 oz (77.7 kg)  02/28/20 173 lb 9.6 oz (78.7 kg)  01/12/20 172 lb (78 kg)    Physical Exam Vitals reviewed.  Constitutional:      Appearance: Normal appearance.  HENT:     Nose: Nose normal.     Mouth/Throat:     Mouth: Mucous membranes are moist.  Eyes:     General: No scleral icterus.    Conjunctiva/sclera: Conjunctivae normal.  Cardiovascular:     Rate and Rhythm: Normal rate and regular rhythm.     Heart sounds: No murmur heard.   Pulmonary:     Effort: Pulmonary effort is normal.     Breath sounds: No stridor. No wheezing, rhonchi or rales.  Abdominal:     General: Abdomen is protuberant. Bowel sounds are normal. There is no distension.     Palpations: Abdomen is soft. There is no hepatomegaly, splenomegaly or mass.     Tenderness: There is no abdominal tenderness.  Musculoskeletal:        General: Normal range of motion.  Cervical back: Neck supple.     Right lower leg: No edema.     Left lower leg: No edema.  Lymphadenopathy:     Cervical: No cervical adenopathy.  Skin:    General: Skin is warm and dry.  Neurological:     General: No focal deficit present.     Mental Status: She is alert.  Psychiatric:        Mood and Affect: Mood normal.        Behavior: Behavior normal.     Lab Results  Component Value Date   WBC 8.5 12/20/2019   HGB 13.2 12/20/2019   HCT 39.6 12/20/2019   PLT 406.0 (H) 12/20/2019   GLUCOSE 132 (H) 12/20/2019     CHOL 163 12/20/2019   TRIG 85.0 12/20/2019   HDL 74.20 12/20/2019   LDLCALC 72 12/20/2019   ALT 22 12/20/2019   AST 21 12/20/2019   NA 138 12/20/2019   K 3.7 12/20/2019   CL 103 12/20/2019   CREATININE 1.39 (H) 12/20/2019   BUN 22 12/20/2019   CO2 23 12/20/2019   TSH 2.68 12/20/2019   INR 1.00 10/23/2016   HGBA1C 5.9 11/15/2018    DG BONE DENSITY (DXA)  Result Date: 12/03/2016 Date of study: 12/02/2016 Exam: DUAL X-RAY ABSORPTIOMETRY (DXA) FOR BONE MINERAL DENSITY (BMD) Instrument: [employer] [facility] Provider: PCP Indication: Screening for low BMD Comparison: none (please note that it is not possible to compare data from different instruments) Clinical data: Pt is a 83 y.o. female with previous history of humeral fracture. On calcium and vitamin D. Results:  Lumbar spine (L1-L4) Femoral neck (FN) T-score -1.5  RFN: -2.2 LFN: -2.2  Assessment: the BMD is low according to the [facility] classification for osteoporosis (see below). Fracture risk: moderate FRAX score: 10 year major osteoporotic risk: 22.4%. 10 year hip fracture risk: 6.2%. These are above the thresholds for treatment of 20% and 3%, respectively. Comments: the technical quality of the study is good. Evaluation for secondary causes should be considered if clinically indicated. Recommend optimizing calcium (1200 mg/day) and vitamin D (800 IU/day) intake. Followup: Repeat BMD is appropriate after 2 years or after 1-2 years if starting treatment. WHO criteria for diagnosis of osteoporosis in postmenopausal women and in men 70 y/o or older: - normal: T-score -1.0 to + 1.0 - osteopenia/low bone density: T-score between -2.5 and -1.0 - osteoporosis: T-score below -2.5 - severe osteoporosis: T-score below -2.5 with history of fragility fracture Note: although not part of the WHO classification, the presence of a fragility fracture, regardless of the T-score, should be considered diagnostic of osteoporosis, provided other causes for the  fracture have been excluded. Treatment: The National Osteoporosis Foundation recommends that treatment be considered in postmenopausal women and men age 49 or older with: 1. Hip or vertebral (clinical or morphometric) fracture 2. T-score of - 2.5 or lower at the spine or hip 3. 10-year fracture probability by FRAX of at least 20% for a major osteoporotic fracture and 3% for a hip fracture Philemon Kingdom, MD [facility]    Assessment & Plan:   Nailani was seen today for hypertension.  Diagnoses and all orders for this visit:  Gastroesophageal reflux disease with esophagitis, unspecified whether hemorrhage -     pantoprazole (PROTONIX) 40 MG tablet; Take 1 tablet (40 mg total) by mouth 2 (two) times daily before a meal.  Benign essential HTN- Considering her age and comorbid illnesses her blood pressure is adequately well controlled.  Antihypertensive  therapy is not indicated.  Stage 3a chronic kidney disease- Her blood pressure is adequately well controlled.  She agrees to avoid nephrotoxic agents.  Dyslipidemia, goal LDL below 70- She has decided not to take a statin.   I have discontinued Zaara Sprowl. Gater's atorvastatin. I am also having her maintain her pantoprazole.  Meds ordered this encounter  Medications  . pantoprazole (PROTONIX) 40 MG tablet    Sig: Take 1 tablet (40 mg total) by mouth 2 (two) times daily before a meal.    Dispense:  60 tablet    Refill:  5     Follow-up: Return in about 6 months (around 09/05/2020).  Scarlette Calico, MD

## 2020-03-06 NOTE — Patient Instructions (Signed)

## 2020-06-20 ENCOUNTER — Other Ambulatory Visit: Payer: Self-pay | Admitting: Internal Medicine

## 2020-06-20 DIAGNOSIS — E785 Hyperlipidemia, unspecified: Secondary | ICD-10-CM

## 2020-07-11 ENCOUNTER — Other Ambulatory Visit: Payer: Self-pay | Admitting: Internal Medicine

## 2020-07-11 DIAGNOSIS — E785 Hyperlipidemia, unspecified: Secondary | ICD-10-CM

## 2020-07-27 ENCOUNTER — Other Ambulatory Visit: Payer: Self-pay | Admitting: Internal Medicine

## 2020-07-27 DIAGNOSIS — E785 Hyperlipidemia, unspecified: Secondary | ICD-10-CM

## 2020-08-08 ENCOUNTER — Other Ambulatory Visit: Payer: Self-pay | Admitting: Internal Medicine

## 2020-08-08 DIAGNOSIS — E785 Hyperlipidemia, unspecified: Secondary | ICD-10-CM

## 2020-08-09 ENCOUNTER — Ambulatory Visit: Payer: Medicare Other | Attending: Internal Medicine

## 2020-08-09 DIAGNOSIS — Z23 Encounter for immunization: Secondary | ICD-10-CM

## 2020-08-09 NOTE — Progress Notes (Signed)
   Covid-19 Vaccination Clinic  Name:  Melanie Hendricks    MRN: [id number] DOB: June 09, 1936  08/09/2020  [name] was observed post Covid-19 immunization for 15 minutes without incident. She was provided with Vaccine Information Sheet and instruction to access the V-Safe system.   [name] was instructed to call 911 with any severe reactions post vaccine: Marland Kitchen Difficulty breathing  . Swelling of face and throat  . A fast heartbeat  . A bad rash all over body  . Dizziness and weakness   Immunizations Administered    Name Date Dose VIS Date Route   Pfizer COVID-19 Vaccine 08/09/2020  1:17 PM 0.3 mL 06/26/2020 Intramuscular   Manufacturer: [place]   Lot: [id number]   [place]: [id number]

## 2020-08-20 ENCOUNTER — Ambulatory Visit: Payer: Medicare Other | Admitting: Internal Medicine

## 2020-11-28 ENCOUNTER — Encounter: Payer: Self-pay | Admitting: Internal Medicine

## 2020-11-28 ENCOUNTER — Ambulatory Visit (INDEPENDENT_AMBULATORY_CARE_PROVIDER_SITE_OTHER): Payer: Medicare Other | Admitting: Internal Medicine

## 2020-11-28 ENCOUNTER — Other Ambulatory Visit: Payer: Self-pay

## 2020-11-28 DIAGNOSIS — M545 Low back pain, unspecified: Secondary | ICD-10-CM

## 2020-11-28 MED ORDER — MELOXICAM 15 MG PO TABS: 15.0000 mg | ORAL_TABLET | Freq: Every day | ORAL | 0 refills | Status: AC

## 2020-11-28 MED ORDER — MELATONIN 3 MG PO TABS: 3.0000 mg | ORAL_TABLET | Freq: Every day | ORAL | 0 refills | Status: AC

## 2020-11-28 NOTE — Patient Instructions (Signed)
We have sent in meloxicam to use 1 pill daily for pain in the back. Take this daily for the next 1-2 weeks. If not improving in the next week or so call us back and we may need to get an x-ray of the back.  We have sent in melatonin to use 1 pill at night time for sleep.

## 2020-11-28 NOTE — Progress Notes (Signed)
° °  Subjective:   Patient ID: Melanie Hendricks, female    DOB: [birth year], 85 y.o.   MRN: [id number]  HPI The patient is an 85 YO female coming in for back pain. Fall 10 days ago and then overexerted about 3 days ago. Pain started day after fall but was not instant. She has taken tylenol which has helped but minimally. She denies numbness or weakness. Is also having problems sleeping and denies trying anything for it. Denies radiation of the pain. 8/10 when bad and present all the time.  Review of Systems  Constitutional: Negative.   HENT: Negative.   Eyes: Negative.   Respiratory: Negative for cough, chest tightness and shortness of breath.   Cardiovascular: Negative for chest pain, palpitations and leg swelling.  Gastrointestinal: Negative for abdominal distention, abdominal pain, constipation, diarrhea, nausea and vomiting.  Musculoskeletal: Positive for back pain and myalgias.  Skin: Negative.   Neurological: Negative.   Psychiatric/Behavioral: Positive for sleep disturbance.    Objective:  Physical Exam Constitutional:      Appearance: She is well-developed.  HENT:     Head: Normocephalic and atraumatic.  Cardiovascular:     Rate and Rhythm: Normal rate and regular rhythm.  Pulmonary:     Effort: Pulmonary effort is normal. No respiratory distress.     Breath sounds: Normal breath sounds. No wheezing or rales.  Abdominal:     General: Bowel sounds are normal. There is no distension.     Palpations: Abdomen is soft.     Tenderness: There is no abdominal tenderness. There is no rebound.  Musculoskeletal:        General: Tenderness present.     Cervical back: Normal range of motion.  Skin:    General: Skin is warm and dry.  Neurological:     Mental Status: She is alert and oriented to person, place, and time.     Coordination: Coordination normal.     Vitals:   11/28/20 1549  BP: 128/76  Pulse: 100  Resp: 18  Temp: 98 F (36.7 C)  TempSrc: Oral  SpO2: 95%   Weight: 167 lb (75.8 kg)  Height: 5\' 6"  (1.676 m)    This visit occurred during the SARS-CoV-2 public health emergency.  Safety protocols were in place, including screening questions prior to the visit, additional usage of staff PPE, and extensive cleaning of exam room while observing appropriate contact time as indicated for disinfecting solutions.   Assessment & Plan:

## 2020-11-29 DIAGNOSIS — M545 Low back pain, unspecified: Secondary | ICD-10-CM | POA: Insufficient documentation

## 2020-11-29 NOTE — Assessment & Plan Note (Signed)
Rx meloxicam and melatonin. She declines x-ray today which was offered given fall about 10 days ago.

## 2021-04-15 ENCOUNTER — Telehealth: Payer: Self-pay | Admitting: Internal Medicine

## 2021-04-15 NOTE — Telephone Encounter (Signed)
LVM for pt to rtn my call at [phone] to schedule AWV with [facility]. Please schedule if pt calls or comes into the office.

## 2022-03-03 ENCOUNTER — Telehealth: Payer: Self-pay | Admitting: Internal Medicine
